# Patient Record
Sex: Male | Born: 1965
Health system: Southern US, Community
[De-identification: ages and names within clinical notes are randomized; demographics above are authoritative.]

## PROBLEM LIST (undated history)

## (undated) DIAGNOSIS — E119 Type 2 diabetes mellitus without complications: Secondary | ICD-10-CM

## (undated) DIAGNOSIS — K5909 Other constipation: Secondary | ICD-10-CM

## (undated) DIAGNOSIS — E785 Hyperlipidemia, unspecified: Secondary | ICD-10-CM

## (undated) DIAGNOSIS — I5022 Chronic systolic (congestive) heart failure: Secondary | ICD-10-CM

## (undated) DIAGNOSIS — I213 ST elevation (STEMI) myocardial infarction of unspecified site: Secondary | ICD-10-CM

## (undated) DIAGNOSIS — I10 Essential (primary) hypertension: Secondary | ICD-10-CM

## (undated) HISTORY — DX: Chronic systolic (congestive) heart failure: I50.22

## (undated) HISTORY — DX: Other constipation: K59.09

## (undated) HISTORY — DX: ST elevation (STEMI) myocardial infarction of unspecified site: I21.3

---

## 2010-04-20 ENCOUNTER — Encounter: Admission: RE | Admit: 2010-04-20 | Discharge: 2010-04-20 | Payer: Self-pay | Admitting: Specialist

## 2015-10-07 DIAGNOSIS — E119 Type 2 diabetes mellitus without complications: Secondary | ICD-10-CM | POA: Diagnosis not present

## 2015-11-27 DIAGNOSIS — G629 Polyneuropathy, unspecified: Secondary | ICD-10-CM | POA: Diagnosis not present

## 2015-11-27 DIAGNOSIS — E119 Type 2 diabetes mellitus without complications: Secondary | ICD-10-CM | POA: Diagnosis not present

## 2015-11-27 DIAGNOSIS — Z6832 Body mass index (BMI) 32.0-32.9, adult: Secondary | ICD-10-CM | POA: Diagnosis not present

## 2015-12-02 DIAGNOSIS — E119 Type 2 diabetes mellitus without complications: Secondary | ICD-10-CM | POA: Diagnosis not present

## 2015-12-04 DIAGNOSIS — G629 Polyneuropathy, unspecified: Secondary | ICD-10-CM | POA: Diagnosis not present

## 2015-12-04 DIAGNOSIS — R209 Unspecified disturbances of skin sensation: Secondary | ICD-10-CM | POA: Diagnosis not present

## 2015-12-05 DIAGNOSIS — E119 Type 2 diabetes mellitus without complications: Secondary | ICD-10-CM | POA: Diagnosis not present

## 2016-01-14 DIAGNOSIS — H20012 Primary iridocyclitis, left eye: Secondary | ICD-10-CM | POA: Diagnosis not present

## 2016-01-14 DIAGNOSIS — E113393 Type 2 diabetes mellitus with moderate nonproliferative diabetic retinopathy without macular edema, bilateral: Secondary | ICD-10-CM | POA: Diagnosis not present

## 2016-01-22 DIAGNOSIS — E113512 Type 2 diabetes mellitus with proliferative diabetic retinopathy with macular edema, left eye: Secondary | ICD-10-CM | POA: Diagnosis not present

## 2016-01-22 DIAGNOSIS — E113393 Type 2 diabetes mellitus with moderate nonproliferative diabetic retinopathy without macular edema, bilateral: Secondary | ICD-10-CM | POA: Diagnosis not present

## 2016-01-22 DIAGNOSIS — H25041 Posterior subcapsular polar age-related cataract, right eye: Secondary | ICD-10-CM | POA: Diagnosis not present

## 2016-01-22 DIAGNOSIS — H20012 Primary iridocyclitis, left eye: Secondary | ICD-10-CM | POA: Diagnosis not present

## 2016-02-06 DIAGNOSIS — E113413 Type 2 diabetes mellitus with severe nonproliferative diabetic retinopathy with macular edema, bilateral: Secondary | ICD-10-CM | POA: Diagnosis not present

## 2016-02-06 DIAGNOSIS — H3582 Retinal ischemia: Secondary | ICD-10-CM | POA: Diagnosis not present

## 2016-02-27 ENCOUNTER — Emergency Department (HOSPITAL_COMMUNITY): Payer: BLUE CROSS/BLUE SHIELD

## 2016-02-27 ENCOUNTER — Inpatient Hospital Stay (HOSPITAL_COMMUNITY)
Admission: EM | Admit: 2016-02-27 | Discharge: 2016-03-08 | DRG: 488 | Disposition: A | Discharge status: Home Health | Payer: BLUE CROSS/BLUE SHIELD | Attending: Orthopedic Surgery | Admitting: Orthopedic Surgery

## 2016-02-27 ENCOUNTER — Emergency Department (HOSPITAL_COMMUNITY): Payer: BLUE CROSS/BLUE SHIELD | Admitting: Certified Registered"

## 2016-02-27 ENCOUNTER — Inpatient Hospital Stay (HOSPITAL_COMMUNITY): Payer: BLUE CROSS/BLUE SHIELD

## 2016-02-27 ENCOUNTER — Encounter (HOSPITAL_COMMUNITY): Payer: Self-pay | Admitting: *Deleted

## 2016-02-27 ENCOUNTER — Encounter (HOSPITAL_COMMUNITY)
Admission: EM | Disposition: A | Discharge status: Home Health | Payer: Self-pay | Source: Home / Self Care | Attending: Orthopedic Surgery

## 2016-02-27 DIAGNOSIS — G8918 Other acute postprocedural pain: Secondary | ICD-10-CM | POA: Diagnosis not present

## 2016-02-27 DIAGNOSIS — T148 Other injury of unspecified body region: Secondary | ICD-10-CM | POA: Diagnosis not present

## 2016-02-27 DIAGNOSIS — S99911A Unspecified injury of right ankle, initial encounter: Secondary | ICD-10-CM | POA: Diagnosis not present

## 2016-02-27 DIAGNOSIS — Z419 Encounter for procedure for purposes other than remedying health state, unspecified: Secondary | ICD-10-CM

## 2016-02-27 DIAGNOSIS — R509 Fever, unspecified: Secondary | ICD-10-CM

## 2016-02-27 DIAGNOSIS — S82142A Displaced bicondylar fracture of left tibia, initial encounter for closed fracture: Secondary | ICD-10-CM | POA: Diagnosis not present

## 2016-02-27 DIAGNOSIS — E291 Testicular hypofunction: Secondary | ICD-10-CM | POA: Diagnosis not present

## 2016-02-27 DIAGNOSIS — Z01818 Encounter for other preprocedural examination: Secondary | ICD-10-CM | POA: Diagnosis not present

## 2016-02-27 DIAGNOSIS — Z7984 Long term (current) use of oral hypoglycemic drugs: Secondary | ICD-10-CM | POA: Diagnosis not present

## 2016-02-27 DIAGNOSIS — E119 Type 2 diabetes mellitus without complications: Secondary | ICD-10-CM

## 2016-02-27 DIAGNOSIS — S82141A Displaced bicondylar fracture of right tibia, initial encounter for closed fracture: Secondary | ICD-10-CM | POA: Diagnosis not present

## 2016-02-27 DIAGNOSIS — S82191A Other fracture of upper end of right tibia, initial encounter for closed fracture: Secondary | ICD-10-CM | POA: Diagnosis not present

## 2016-02-27 DIAGNOSIS — R5082 Postprocedural fever: Secondary | ICD-10-CM | POA: Diagnosis not present

## 2016-02-27 DIAGNOSIS — J9811 Atelectasis: Secondary | ICD-10-CM | POA: Diagnosis not present

## 2016-02-27 DIAGNOSIS — W19XXXA Unspecified fall, initial encounter: Secondary | ICD-10-CM | POA: Diagnosis not present

## 2016-02-27 DIAGNOSIS — M818 Other osteoporosis without current pathological fracture: Secondary | ICD-10-CM | POA: Diagnosis present

## 2016-02-27 DIAGNOSIS — S82143A Displaced bicondylar fracture of unspecified tibia, initial encounter for closed fracture: Secondary | ICD-10-CM | POA: Diagnosis present

## 2016-02-27 DIAGNOSIS — D62 Acute posthemorrhagic anemia: Secondary | ICD-10-CM | POA: Diagnosis not present

## 2016-02-27 DIAGNOSIS — S82251A Displaced comminuted fracture of shaft of right tibia, initial encounter for closed fracture: Secondary | ICD-10-CM | POA: Diagnosis not present

## 2016-02-27 DIAGNOSIS — W11XXXA Fall on and from ladder, initial encounter: Secondary | ICD-10-CM | POA: Diagnosis not present

## 2016-02-27 DIAGNOSIS — K5900 Constipation, unspecified: Secondary | ICD-10-CM | POA: Diagnosis not present

## 2016-02-27 DIAGNOSIS — E559 Vitamin D deficiency, unspecified: Secondary | ICD-10-CM | POA: Diagnosis not present

## 2016-02-27 DIAGNOSIS — L97404 Non-pressure chronic ulcer of unspecified heel and midfoot with necrosis of bone: Secondary | ICD-10-CM | POA: Diagnosis not present

## 2016-02-27 DIAGNOSIS — E1165 Type 2 diabetes mellitus with hyperglycemia: Secondary | ICD-10-CM | POA: Diagnosis present

## 2016-02-27 DIAGNOSIS — S82101A Unspecified fracture of upper end of right tibia, initial encounter for closed fracture: Secondary | ICD-10-CM

## 2016-02-27 DIAGNOSIS — T40605A Adverse effect of unspecified narcotics, initial encounter: Secondary | ICD-10-CM | POA: Diagnosis not present

## 2016-02-27 DIAGNOSIS — R269 Unspecified abnormalities of gait and mobility: Secondary | ICD-10-CM | POA: Diagnosis not present

## 2016-02-27 DIAGNOSIS — S83281A Other tear of lateral meniscus, current injury, right knee, initial encounter: Secondary | ICD-10-CM | POA: Diagnosis present

## 2016-02-27 DIAGNOSIS — L97104 Non-pressure chronic ulcer of unspecified thigh with necrosis of bone: Secondary | ICD-10-CM | POA: Diagnosis not present

## 2016-02-27 DIAGNOSIS — M25561 Pain in right knee: Secondary | ICD-10-CM | POA: Diagnosis not present

## 2016-02-27 HISTORY — PX: EXTERNAL FIXATION LEG: SHX1549

## 2016-02-27 HISTORY — DX: Type 2 diabetes mellitus without complications: E11.9

## 2016-02-27 LAB — BASIC METABOLIC PANEL
Anion gap: 10 (ref 5–15)
BUN: 14 mg/dL (ref 6–20)
CO2: 22 mmol/L (ref 22–32)
Calcium: 9 mg/dL (ref 8.9–10.3)
Chloride: 103 mmol/L (ref 101–111)
Creatinine, Ser: 0.68 mg/dL (ref 0.61–1.24)
GFR calc Af Amer: >60 mL/min (ref >60)
GFR calc non Af Amer: >60 mL/min (ref >60)
Glucose, Bld: 356 mg/dL — ABNORMAL HIGH (ref 65–99)
Potassium: 4.2 mmol/L (ref 3.5–5.1)
Sodium: 135 mmol/L (ref 135–145)

## 2016-02-27 LAB — CBC WITH DIFFERENTIAL/PLATELET
Basophils Absolute: 0.1 10*3/uL (ref 0.0–0.1)
Basophils Relative: 1 %
Eosinophils Absolute: 0.2 10*3/uL (ref 0.0–0.7)
Eosinophils Relative: 2 %
HCT: 40.2 % (ref 39.0–52.0)
Hemoglobin: 13.8 g/dL (ref 13.0–17.0)
Lymphocytes Relative: 11 %
Lymphs Abs: 1.3 10*3/uL (ref 0.7–4.0)
MCH: 29.8 pg (ref 26.0–34.0)
MCHC: 34.3 g/dL (ref 30.0–36.0)
MCV: 86.8 fL (ref 78.0–100.0)
Monocytes Absolute: 0.5 10*3/uL (ref 0.1–1.0)
Monocytes Relative: 4 %
Neutro Abs: 9.6 10*3/uL — ABNORMAL HIGH (ref 1.7–7.7)
Neutrophils Relative %: 82 %
Platelets: 192 10*3/uL (ref 150–400)
RBC: 4.63 MIL/uL (ref 4.22–5.81)
RDW: 12.5 % (ref 11.5–15.5)
WBC: 11.7 10*3/uL — ABNORMAL HIGH (ref 4.0–10.5)

## 2016-02-27 LAB — GLUCOSE, CAPILLARY
Glucose-Capillary: 325 mg/dL — ABNORMAL HIGH (ref 65–99)
Glucose-Capillary: 305 mg/dL — ABNORMAL HIGH (ref 65–99)

## 2016-02-27 LAB — CBG MONITORING, ED: Glucose-Capillary: 288 mg/dL — ABNORMAL HIGH (ref 65–99)

## 2016-02-27 LAB — PROTIME-INR
INR: 1.11
Prothrombin Time: 14.4 seconds (ref 11.4–15.2)

## 2016-02-27 LAB — SURGICAL PCR SCREEN
MRSA, PCR: NEGATIVE
Staphylococcus aureus: NEGATIVE

## 2016-02-27 SURGERY — EXTERNAL FIXATION, LOWER EXTREMITY
Anesthesia: General | Site: Knee | Laterality: Right

## 2016-02-27 MED ORDER — 0.9 % SODIUM CHLORIDE (POUR BTL) OPTIME
TOPICAL | Status: DC | PRN
  Administered 2016-02-27: 1000 mL

## 2016-02-27 MED ORDER — ENOXAPARIN SODIUM 40 MG/0.4ML ~~LOC~~ SOLN
40.0000 mg | SUBCUTANEOUS | Status: DC
  Administered 2016-02-28 – 2016-03-08 (×9): 40 mg via SUBCUTANEOUS
  Filled 2016-02-27 (×9): qty 0.4

## 2016-02-27 MED ORDER — PROPOFOL 10 MG/ML IV BOLUS
INTRAVENOUS | Status: AC
  Filled 2016-02-27: qty 20

## 2016-02-27 MED ORDER — ACETAMINOPHEN 325 MG PO TABS
650.0000 mg | ORAL_TABLET | Freq: Four times a day (QID) | ORAL | Status: DC | PRN
  Administered 2016-02-28 – 2016-03-02 (×7): 650 mg via ORAL
  Filled 2016-02-27 (×9): qty 2

## 2016-02-27 MED ORDER — METOCLOPRAMIDE HCL 5 MG/ML IJ SOLN
INTRAMUSCULAR | Status: DC | PRN
  Administered 2016-02-27: 10 mg via INTRAVENOUS

## 2016-02-27 MED ORDER — MUPIROCIN 2 % EX OINT
TOPICAL_OINTMENT | CUTANEOUS | Status: AC
  Filled 2016-02-27: qty 22

## 2016-02-27 MED ORDER — MIDAZOLAM HCL 2 MG/2ML IJ SOLN
INTRAMUSCULAR | Status: AC
  Filled 2016-02-27: qty 2

## 2016-02-27 MED ORDER — METHOCARBAMOL 500 MG PO TABS
500.0000 mg | ORAL_TABLET | Freq: Four times a day (QID) | ORAL | Status: DC | PRN
  Administered 2016-02-27 – 2016-03-08 (×23): 500 mg via ORAL
  Filled 2016-02-27 (×24): qty 1

## 2016-02-27 MED ORDER — ROCURONIUM BROMIDE 10 MG/ML (PF) SYRINGE
PREFILLED_SYRINGE | INTRAVENOUS | Status: AC
  Filled 2016-02-27: qty 10

## 2016-02-27 MED ORDER — HYDROMORPHONE HCL 1 MG/ML IJ SOLN
1.0000 mg | INTRAMUSCULAR | Status: DC | PRN
  Administered 2016-02-27 – 2016-03-07 (×29): 1 mg via INTRAVENOUS
  Filled 2016-02-27 (×31): qty 1

## 2016-02-27 MED ORDER — CEFAZOLIN SODIUM-DEXTROSE 2-4 GM/100ML-% IV SOLN
2.0000 g | INTRAVENOUS | Status: AC
  Administered 2016-02-27: 2 g via INTRAVENOUS

## 2016-02-27 MED ORDER — INSULIN ASPART 100 UNIT/ML ~~LOC~~ SOLN
0.0000 [IU] | Freq: Three times a day (TID) | SUBCUTANEOUS | Status: DC
  Administered 2016-02-27: 11 [IU] via SUBCUTANEOUS
  Administered 2016-02-28 (×2): 5 [IU] via SUBCUTANEOUS
  Administered 2016-02-28: 3 [IU] via SUBCUTANEOUS
  Administered 2016-02-29: 0 [IU] via SUBCUTANEOUS
  Administered 2016-02-29 – 2016-03-01 (×3): 3 [IU] via SUBCUTANEOUS
  Administered 2016-03-01: 2 [IU] via SUBCUTANEOUS
  Administered 2016-03-01 – 2016-03-02 (×2): 3 [IU] via SUBCUTANEOUS
  Administered 2016-03-02: 5 [IU] via SUBCUTANEOUS
  Administered 2016-03-02 – 2016-03-03 (×3): 3 [IU] via SUBCUTANEOUS
  Administered 2016-03-03: 2 [IU] via SUBCUTANEOUS
  Administered 2016-03-04 – 2016-03-05 (×2): 5 [IU] via SUBCUTANEOUS
  Administered 2016-03-05 – 2016-03-06 (×3): 3 [IU] via SUBCUTANEOUS
  Administered 2016-03-06: 2 [IU] via SUBCUTANEOUS

## 2016-02-27 MED ORDER — ONDANSETRON HCL 4 MG/2ML IJ SOLN
INTRAMUSCULAR | Status: DC | PRN
  Administered 2016-02-27: 4 mg via INTRAVENOUS

## 2016-02-27 MED ORDER — PROPOFOL 10 MG/ML IV BOLUS
INTRAVENOUS | Status: DC | PRN
  Administered 2016-02-27: 200 mg via INTRAVENOUS

## 2016-02-27 MED ORDER — SUFENTANIL CITRATE 50 MCG/ML IV SOLN
INTRAVENOUS | Status: DC | PRN
Start: 2016-02-27 — End: 2016-02-27
  Administered 2016-02-27: 10 ug via INTRAVENOUS

## 2016-02-27 MED ORDER — HYDROMORPHONE HCL 1 MG/ML IJ SOLN
1.0000 mg | Freq: Once | INTRAMUSCULAR | Status: AC
  Administered 2016-02-27: 1 mg via INTRAVENOUS
  Filled 2016-02-27: qty 1

## 2016-02-27 MED ORDER — SUCCINYLCHOLINE CHLORIDE 200 MG/10ML IV SOSY
PREFILLED_SYRINGE | INTRAVENOUS | Status: AC
  Filled 2016-02-27: qty 10

## 2016-02-27 MED ORDER — SODIUM CHLORIDE 0.9 % IV SOLN
INTRAVENOUS | Status: DC
  Administered 2016-02-28 (×2): via INTRAVENOUS
  Administered 2016-02-29: 75 mL/h via INTRAVENOUS
  Administered 2016-03-01 – 2016-03-05 (×5): via INTRAVENOUS

## 2016-02-27 MED ORDER — ONDANSETRON HCL 4 MG/2ML IJ SOLN
INTRAMUSCULAR | Status: AC
  Filled 2016-02-27: qty 2

## 2016-02-27 MED ORDER — METHOCARBAMOL 1000 MG/10ML IJ SOLN
500.0000 mg | Freq: Four times a day (QID) | INTRAVENOUS | Status: DC | PRN
  Filled 2016-02-27: qty 5

## 2016-02-27 MED ORDER — LIDOCAINE 2% (20 MG/ML) 5 ML SYRINGE
INTRAMUSCULAR | Status: AC
  Filled 2016-02-27: qty 5

## 2016-02-27 MED ORDER — SUFENTANIL CITRATE 50 MCG/ML IV SOLN
INTRAVENOUS | Status: AC
  Filled 2016-02-27: qty 1

## 2016-02-27 MED ORDER — MIDAZOLAM HCL 5 MG/5ML IJ SOLN
INTRAMUSCULAR | Status: DC | PRN
  Administered 2016-02-27: 2 mg via INTRAVENOUS

## 2016-02-27 MED ORDER — ONDANSETRON HCL 4 MG/2ML IJ SOLN: 4.0000 mg | Freq: Four times a day (QID) | INTRAMUSCULAR | Status: DC | PRN

## 2016-02-27 MED ORDER — LACTATED RINGERS IV SOLN
INTRAVENOUS | Status: DC | PRN
  Administered 2016-02-27: 18:00:00 via INTRAVENOUS

## 2016-02-27 MED ORDER — CHLORHEXIDINE GLUCONATE 4 % EX LIQD: 60.0000 mL | Freq: Once | CUTANEOUS | Status: DC

## 2016-02-27 MED ORDER — METOCLOPRAMIDE HCL 5 MG/ML IJ SOLN
INTRAMUSCULAR | Status: AC
  Filled 2016-02-27: qty 2

## 2016-02-27 MED ORDER — DOCUSATE SODIUM 100 MG PO CAPS
100.0000 mg | ORAL_CAPSULE | Freq: Two times a day (BID) | ORAL | Status: DC
  Administered 2016-02-27 – 2016-03-08 (×19): 100 mg via ORAL
  Filled 2016-02-27 (×19): qty 1

## 2016-02-27 MED ORDER — KETOROLAC TROMETHAMINE 30 MG/ML IJ SOLN: 30.0000 mg | Freq: Once | INTRAMUSCULAR | Status: DC | PRN

## 2016-02-27 MED ORDER — LIDOCAINE 2% (20 MG/ML) 5 ML SYRINGE
INTRAMUSCULAR | Status: DC | PRN
  Administered 2016-02-27: 60 mg via INTRAVENOUS

## 2016-02-27 MED ORDER — HYDROMORPHONE HCL 1 MG/ML IJ SOLN
INTRAMUSCULAR | Status: AC
  Filled 2016-02-27: qty 1

## 2016-02-27 MED ORDER — INSULIN ASPART 100 UNIT/ML ~~LOC~~ SOLN
SUBCUTANEOUS | Status: AC
  Filled 2016-02-27: qty 11

## 2016-02-27 MED ORDER — CEFAZOLIN SODIUM-DEXTROSE 2-4 GM/100ML-% IV SOLN
INTRAVENOUS | Status: AC
  Filled 2016-02-27: qty 100

## 2016-02-27 MED ORDER — POLYETHYLENE GLYCOL 3350 17 G PO PACK
17.0000 g | PACK | Freq: Every day | ORAL | Status: DC | PRN
  Administered 2016-03-01: 17 g via ORAL
  Filled 2016-02-27: qty 1

## 2016-02-27 MED ORDER — METOCLOPRAMIDE HCL 5 MG/ML IJ SOLN: 5.0000 mg | Freq: Three times a day (TID) | INTRAMUSCULAR | Status: DC | PRN

## 2016-02-27 MED ORDER — INSULIN ASPART 100 UNIT/ML ~~LOC~~ SOLN
4.0000 [IU] | Freq: Three times a day (TID) | SUBCUTANEOUS | Status: DC
  Administered 2016-02-28: 4 [IU] via SUBCUTANEOUS

## 2016-02-27 MED ORDER — INSULIN ASPART 100 UNIT/ML ~~LOC~~ SOLN
0.0000 [IU] | Freq: Every day | SUBCUTANEOUS | Status: DC
  Administered 2016-02-27: 4 [IU] via SUBCUTANEOUS

## 2016-02-27 MED ORDER — PROMETHAZINE HCL 25 MG/ML IJ SOLN: 6.2500 mg | INTRAMUSCULAR | Status: DC | PRN

## 2016-02-27 MED ORDER — ACETAMINOPHEN 650 MG RE SUPP: 650.0000 mg | Freq: Four times a day (QID) | RECTAL | Status: DC | PRN

## 2016-02-27 MED ORDER — LACTATED RINGERS IV SOLN
INTRAVENOUS | Status: DC
  Administered 2016-03-04: 08:00:00 via INTRAVENOUS

## 2016-02-27 MED ORDER — OXYCODONE HCL 5 MG PO TABS
5.0000 mg | ORAL_TABLET | ORAL | Status: DC | PRN
  Administered 2016-02-28 – 2016-03-08 (×43): 10 mg via ORAL
  Administered 2016-03-08: 5 mg via ORAL
  Filled 2016-02-27 (×16): qty 2
  Filled 2016-02-27: qty 1
  Filled 2016-02-27 (×30): qty 2

## 2016-02-27 MED ORDER — PHENYLEPHRINE 40 MCG/ML (10ML) SYRINGE FOR IV PUSH (FOR BLOOD PRESSURE SUPPORT)
PREFILLED_SYRINGE | INTRAVENOUS | Status: AC
  Filled 2016-02-27: qty 10

## 2016-02-27 MED ORDER — METFORMIN HCL 500 MG PO TABS
1000.0000 mg | ORAL_TABLET | Freq: Two times a day (BID) | ORAL | Status: DC
Start: 2016-02-28 — End: 2016-02-28
  Administered 2016-02-28: 1000 mg via ORAL
  Filled 2016-02-27: qty 2

## 2016-02-27 MED ORDER — SUCCINYLCHOLINE CHLORIDE 200 MG/10ML IV SOSY
PREFILLED_SYRINGE | INTRAVENOUS | Status: DC | PRN
  Administered 2016-02-27: 120 mg via INTRAVENOUS

## 2016-02-27 MED ORDER — SODIUM CHLORIDE 0.9 % IJ SOLN
INTRAMUSCULAR | Status: AC
  Filled 2016-02-27: qty 10

## 2016-02-27 MED ORDER — ONDANSETRON HCL 4 MG PO TABS: 4.0000 mg | ORAL_TABLET | Freq: Four times a day (QID) | ORAL | Status: DC | PRN

## 2016-02-27 MED ORDER — CEFAZOLIN SODIUM-DEXTROSE 2-4 GM/100ML-% IV SOLN
2.0000 g | Freq: Four times a day (QID) | INTRAVENOUS | Status: AC
  Administered 2016-02-27 – 2016-02-28 (×3): 2 g via INTRAVENOUS
  Filled 2016-02-27 (×3): qty 100

## 2016-02-27 MED ORDER — HYDROMORPHONE HCL 1 MG/ML IJ SOLN: 0.2500 mg | INTRAMUSCULAR | Status: DC | PRN

## 2016-02-27 MED ORDER — METOCLOPRAMIDE HCL 5 MG PO TABS: 5.0000 mg | ORAL_TABLET | Freq: Three times a day (TID) | ORAL | Status: DC | PRN

## 2016-02-27 SURGICAL SUPPLY — 60 items
BANDAGE ACE 4X5 VEL STRL LF (GAUZE/BANDAGES/DRESSINGS) ×2 IMPLANT
BANDAGE ACE 6X5 VEL STRL LF (GAUZE/BANDAGES/DRESSINGS) ×2 IMPLANT
BANDAGE ESMARK 6X9 LF (GAUZE/BANDAGES/DRESSINGS) ×1 IMPLANT
BAR GLASS FIBER EXFX 11X500 (EXFIX) ×4 IMPLANT
BNDG COHESIVE 6X5 TAN STRL LF (GAUZE/BANDAGES/DRESSINGS) ×2 IMPLANT
BNDG ELASTIC 6X10 VLCR STRL LF (GAUZE/BANDAGES/DRESSINGS) ×2 IMPLANT
BNDG ESMARK 6X9 LF (GAUZE/BANDAGES/DRESSINGS) ×2
BNDG GAUZE ELAST 4 BULKY (GAUZE/BANDAGES/DRESSINGS) ×2 IMPLANT
CLAMP MULTI-PIN 2-BAR 75MM (EXFIX) ×4 IMPLANT
CLEANER TIP ELECTROSURG 2X2 (MISCELLANEOUS) ×2 IMPLANT
COVER SURGICAL LIGHT HANDLE (MISCELLANEOUS) ×2 IMPLANT
CUFF TOURNIQUET SINGLE 18IN (TOURNIQUET CUFF) IMPLANT
CUFF TOURNIQUET SINGLE 24IN (TOURNIQUET CUFF) IMPLANT
CUFF TOURNIQUET SINGLE 34IN LL (TOURNIQUET CUFF) IMPLANT
DRAPE C-ARM 42X72 X-RAY (DRAPES) IMPLANT
DRAPE OEC MINIVIEW 54X84 (DRAPES) ×2 IMPLANT
DRAPE U-SHAPE 47X51 STRL (DRAPES) ×2 IMPLANT
DRSG ADAPTIC 3X8 NADH LF (GAUZE/BANDAGES/DRESSINGS) ×2 IMPLANT
GAUZE SPONGE 4X4 12PLY STRL (GAUZE/BANDAGES/DRESSINGS) ×2 IMPLANT
GAUZE XEROFORM 1X8 LF (GAUZE/BANDAGES/DRESSINGS) ×2 IMPLANT
GLOVE BIO SURGEON STRL SZ8 (GLOVE) ×2 IMPLANT
GLOVE BIOGEL M STRL SZ7.5 (GLOVE) ×2 IMPLANT
GLOVE EUDERMIC 7 POWDERFREE (GLOVE) ×2 IMPLANT
GLOVE SS BIOGEL STRL SZ 7.5 (GLOVE) ×1 IMPLANT
GLOVE SUPERSENSE BIOGEL SZ 7.5 (GLOVE) ×1
GOWN STRL REUS W/ TWL LRG LVL3 (GOWN DISPOSABLE) ×1 IMPLANT
GOWN STRL REUS W/ TWL XL LVL3 (GOWN DISPOSABLE) ×2 IMPLANT
GOWN STRL REUS W/TWL LRG LVL3 (GOWN DISPOSABLE) ×1
GOWN STRL REUS W/TWL XL LVL3 (GOWN DISPOSABLE) ×2
HALF PIN 5.0X160 (EXFIX) ×4 IMPLANT
HANDPIECE INTERPULSE COAX TIP (DISPOSABLE)
KIT ROOM TURNOVER OR (KITS) ×2 IMPLANT
MANIFOLD NEPTUNE II (INSTRUMENTS) ×2 IMPLANT
NEEDLE 22X1 1/2 (OR ONLY) (NEEDLE) IMPLANT
NS IRRIG 1000ML POUR BTL (IV SOLUTION) ×2 IMPLANT
PACK ORTHO EXTREMITY (CUSTOM PROCEDURE TRAY) ×2 IMPLANT
PAD ARMBOARD 7.5X6 YLW CONV (MISCELLANEOUS) ×4 IMPLANT
PADDING CAST COTTON 6X4 STRL (CAST SUPPLIES) ×6 IMPLANT
PIN HALF YELLOW 5X160X35 (EXFIX) ×4 IMPLANT
SET HNDPC FAN SPRY TIP SCT (DISPOSABLE) IMPLANT
SPONGE LAP 18X18 X RAY DECT (DISPOSABLE) ×2 IMPLANT
SPONGE SCRUB IODOPHOR (GAUZE/BANDAGES/DRESSINGS) ×2 IMPLANT
STAPLER VISISTAT 35W (STAPLE) IMPLANT
STOCKINETTE IMPERVIOUS LG (DRAPES) ×2 IMPLANT
STRIP CLOSURE SKIN 1/2X4 (GAUZE/BANDAGES/DRESSINGS) IMPLANT
SUCTION FRAZIER HANDLE 10FR (MISCELLANEOUS)
SUCTION TUBE FRAZIER 10FR DISP (MISCELLANEOUS) IMPLANT
SUT ETHILON 3 0 PS 1 (SUTURE) IMPLANT
SUT VIC AB 0 CT1 27 (SUTURE) ×2
SUT VIC AB 0 CT1 27XBRD ANBCTR (SUTURE) ×2 IMPLANT
SUT VIC AB 2-0 CT1 27 (SUTURE) ×2
SUT VIC AB 2-0 CT1 TAPERPNT 27 (SUTURE) ×2 IMPLANT
SUT VIC AB 2-0 CT3 27 (SUTURE) IMPLANT
SYR CONTROL 10ML LL (SYRINGE) IMPLANT
TOWEL OR 17X24 6PK STRL BLUE (TOWEL DISPOSABLE) ×4 IMPLANT
TOWEL OR 17X26 10 PK STRL BLUE (TOWEL DISPOSABLE) ×4 IMPLANT
TUBE CONNECTING 12X1/4 (SUCTIONS) ×2 IMPLANT
UNDERPAD 30X30 (UNDERPADS AND DIAPERS) ×2 IMPLANT
WATER STERILE IRR 1000ML POUR (IV SOLUTION) ×4 IMPLANT
YANKAUER SUCT BULB TIP NO VENT (SUCTIONS) ×2 IMPLANT

## 2016-02-27 NOTE — Transfer of Care (Signed)
Immediate Anesthesia Transfer of Care Note  Patient: Lawrence [name]  Procedure(s) Performed: Procedure(s) with comments: EXTERNAL FIXATION LEG (Right) - reduction and external fixation right tibial plateau fracture  Patient Location: PACU  Anesthesia Type:General  Level of Consciousness: awake  Airway & Oxygen Therapy: Patient Spontanous Breathing  Post-op Assessment: Report given to RN and Post -op Vital signs reviewed and stable  Post vital signs: Reviewed and stable  Last Vitals:  Vitals:   02/27/16 1441 02/27/16 1907  BP: 161/92 (!) 133/59  Pulse: 104   Resp: 24   Temp: 36.8 C 36.5 C    Last Pain:  Vitals:   02/27/16 1907  TempSrc:   PainSc: 0-No pain         Complications: No apparent anesthesia complications
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
## 2016-02-27 NOTE — Progress Notes (Signed)
Patient's family states that patient has never worn CPAP at night and does not wish to wear now.

## 2016-02-27 NOTE — Anesthesia Procedure Notes (Signed)
Procedure Name: Intubation Date/Time: 02/27/2016 6:16 PM Performed by: Melina Copa, DAVID R Pre-anesthesia Checklist: Patient identified, Emergency Drugs available, Suction available and Patient being monitored Patient Re-evaluated:Patient Re-evaluated prior to inductionOxygen Delivery Method: Circle System Utilized Preoxygenation: Pre-oxygenation with 100% oxygen Intubation Type: IV induction and Rapid sequence Ventilation: Mask ventilation without difficulty Laryngoscope Size: Mac Grade View: Grade I Tube type: Oral Tube size: 8.0 mm Number of attempts: 1 Airway Equipment and Method: Stylet Placement Confirmation: ETT inserted through vocal cords under direct vision,  positive ETCO2 and breath sounds checked- equal and bilateral Secured at: 24 cm Tube secured with: Tape Dental Injury: Teeth and Oropharynx as per pre-operative assessment
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
## 2016-02-27 NOTE — Brief Op Note (Signed)
02/27/2016  7:10 PM  PATIENT:  Lawrence [name]  50 y.o. male  PRE-OPERATIVE DIAGNOSIS:  Comminuted and displaced fracture of right tibial plateau  POST-OPERATIVE DIAGNOSIS: Comminuted and displaced fracture of right tibial plateau  PROCEDURE:  Procedure(s) with comments: EXTERNAL FIXATION LEG (Right) - reduction and external fixation right tibial plateau fracture - spanning fixator  SURGEON:  Surgeon(s) and Role:    * Netta Cedars, MD - Primary  PHYSICIAN ASSISTANT:   ASSISTANTS: none   ANESTHESIA:   general  EBL:  Total I/O In: 500 [I.V.:500] Out: 2 [Blood:2]  BLOOD ADMINISTERED:none  DRAINS: none   LOCAL MEDICATIONS USED:  NONE  SPECIMEN:  No Specimen  DISPOSITION OF SPECIMEN:  N/A  COUNTS:  YES  TOURNIQUET:    DICTATION:1111  PLAN OF CARE: Admit to inpatient   PATIENT DISPOSITION:  PACU - hemodynamically stable.   Delay start of Pharmacological VTE agent (>24hrs) due to surgical blood loss or risk of bleeding: no

## 2016-02-27 NOTE — Anesthesia Postprocedure Evaluation (Signed)
Anesthesia Post Note  Patient: Lawrence [name]  Procedure(s) Performed: Procedure(s) (LRB): EXTERNAL FIXATION LEG (Right)  Patient location during evaluation: PACU Anesthesia Type: General Level of consciousness: awake and alert Pain management: pain level controlled Vital Signs Assessment: post-procedure vital signs reviewed and stable Respiratory status: spontaneous breathing, nonlabored ventilation, respiratory function stable and patient connected to nasal cannula oxygen Cardiovascular status: blood pressure returned to baseline and stable Postop Assessment: no signs of nausea or vomiting Anesthetic complications: no    Last Vitals:  Vitals:   02/27/16 1944 02/27/16 2020  BP: (!) 164/87 (!) 147/97  Pulse: (!) 120 (!) 108  Resp:  18  Temp: 36.7 C 37.4 C    Last Pain:  Vitals:   02/27/16 2020  TempSrc: Oral  PainSc: 10-Worst pain ever                 Tiajuana Amass

## 2016-02-27 NOTE — ED Notes (Signed)
Pt taken to Xray at this time.

## 2016-02-27 NOTE — ED Triage Notes (Signed)
Pt arrives from work via [employer] after falling off of a ladder and landing onto his feet. Pt didn't hit his head and only has c/o right knee pain. No edema or deformity noted.

## 2016-02-27 NOTE — Anesthesia Preprocedure Evaluation (Addendum)
Anesthesia Evaluation  Patient identified by MRN, date of birth, ID band Patient awake    Reviewed: Allergy & Precautions, NPO status , Patient's Chart, lab work & pertinent test results  Airway Mallampati: I  TM Distance: >3 FB Neck ROM: Full    Dental  (+) Teeth Intact, Dental Advisory Given   Pulmonary    breath sounds clear to auscultation       Cardiovascular  Rhythm:Regular Rate:Normal     Neuro/Psych    GI/Hepatic   Endo/Other  diabetes, Type 2, Oral Hypoglycemic Agents  Renal/GU      Musculoskeletal   Abdominal   Peds  Hematology   Anesthesia Other Findings   Reproductive/Obstetrics                            Anesthesia Physical Anesthesia Plan  ASA: II and emergent  Anesthesia Plan: General   Post-op Pain Management:    Induction: Intravenous, Rapid sequence and Cricoid pressure planned  Airway Management Planned: Oral ETT  Additional Equipment: None  Intra-op Plan:   Post-operative Plan: Extubation in OR  Informed Consent: I have reviewed the patients History and Physical, chart, labs and discussed the procedure including the risks, benefits and alternatives for the proposed anesthesia with the patient or authorized representative who has indicated his/her understanding and acceptance.   Dental advisory given  Plan Discussed with: CRNA, Anesthesiologist and Surgeon  Anesthesia Plan Comments:        Anesthesia Quick Evaluation

## 2016-02-27 NOTE — ED Notes (Signed)
Patient transported to X-ray 

## 2016-02-27 NOTE — H&P (Signed)
Lawrence [name] is an 50 y.o. male.   Chief Complaint: Right knee pain HPI: 50 yo male s/p fall from ladder approximately 5-7 feet landing on his right leg.  Patient complained of immediate right knee pain and was unable to bear weight on the knee.  Patient transported to the [facility] ED for further eval and treatment.  History reviewed. No pertinent past medical history.  History reviewed. No pertinent surgical history.  History reviewed. No pertinent family history. Social History:  does not have a smoking history on file. He has never used smokeless tobacco. He reports that he drinks alcohol. He reports that he does not use drugs.  Allergies: No Known Allergies   (Not in a hospital admission)  Results for orders placed or performed during the hospital encounter of 02/27/16 (from the past 48 hour(s))  Basic metabolic panel     Status: Abnormal   Collection Time: 02/27/16  4:00 PM  Result Value Ref Range   Sodium 135 135 - 145 mmol/L   Potassium 4.2 3.5 - 5.1 mmol/L   Chloride 103 101 - 111 mmol/L   CO2 22 22 - 32 mmol/L   Glucose, Bld 356 (H) 65 - 99 mg/dL   BUN 14 6 - 20 mg/dL   Creatinine, Ser 0.68 0.61 - 1.24 mg/dL   Calcium 9.0 8.9 - 10.3 mg/dL   GFR calc non Af Amer >60 >60 mL/min   GFR calc Af Amer >60 >60 mL/min    Comment: (NOTE) The eGFR has been calculated using the CKD EPI equation. This calculation has not been validated in all clinical situations. eGFR's persistently <60 mL/min signify possible Chronic Kidney Disease.    Anion gap 10 5 - 15  CBC with Differential     Status: Abnormal   Collection Time: 02/27/16  4:00 PM  Result Value Ref Range   WBC 11.7 (H) 4.0 - 10.5 K/uL   RBC 4.63 4.22 - 5.81 MIL/uL   Hemoglobin 13.8 13.0 - 17.0 g/dL   HCT 40.2 39.0 - 52.0 %   MCV 86.8 78.0 - 100.0 fL   MCH 29.8 26.0 - 34.0 pg   MCHC 34.3 30.0 - 36.0 g/dL   RDW 12.5 11.5 - 15.5 %   Platelets 192 150 - 400 K/uL   Neutrophils Relative % 82 %   Neutro Abs 9.6 (H) 1.7 - 7.7  K/uL   Lymphocytes Relative 11 %   Lymphs Abs 1.3 0.7 - 4.0 K/uL   Monocytes Relative 4 %   Monocytes Absolute 0.5 0.1 - 1.0 K/uL   Eosinophils Relative 2 %   Eosinophils Absolute 0.2 0.0 - 0.7 K/uL   Basophils Relative 1 %   Basophils Absolute 0.1 0.0 - 0.1 K/uL  CBG monitoring, ED     Status: Abnormal   Collection Time: 02/27/16  4:40 PM  Result Value Ref Range   Glucose-Capillary 288 (H) 65 - 99 mg/dL   Dg Knee 1-2 Views Right  Result Date: 02/27/2016 CLINICAL DATA:  Injury.  Pain and deformity. EXAM: RIGHT KNEE - 1-2 VIEW COMPARISON:  No recent. FINDINGS: Comminuted, displaced, angulated fracture of the of proximal tibia is noted. Fracture extends into the joint space. Knee joint effusion noted. IMPRESSION: Comminuted prominent displaced and angulated fracture of the of proximal tibia with extension of the fracture is into the joint space. Knee joint effusion noted. Electronically Signed   By: Marcello Moores  Register   On: 02/27/2016 15:53   Dg Tibia/fibula Right  Result Date: 02/27/2016  CLINICAL DATA:  50 year old who fell approximately 5 feet off of a ladder in the garage earlier today. Injury to the right lower leg. Initial encounter. EXAM: RIGHT TIBIA AND FIBULA - 2 VIEW COMPARISON:  Right knee x-ray obtained concurrently. FINDINGS: Comminuted multipart fracture involving the proximal tibia which extends from the lateral plateau to the medial cortex of the proximal metaphysis, such that the medial tibial plateau is a free fragment. No fractures elsewhere involving the tibia or fibula. IMPRESSION: Comminuted multipart fracture involving the proximal tibia extending from the lateral tibial plateau to the medial cortex of the proximal metaphysis. The medial tibial plateau is present as a free fragment. Electronically Signed   By: Evangeline Dakin M.D.   On: 02/27/2016 15:48   Dg Ankle Complete Right  Result Date: 02/27/2016 CLINICAL DATA:  50 year old who fell approximately 5 feet off of a  ladder in a garage earlier today. Injury to the right lower leg. Initial encounter. EXAM: RIGHT ANKLE - COMPLETE 3+ VIEW COMPARISON:  None. FINDINGS: No evidence of acute fracture. Ankle mortise intact with well-preserved joint space. Well-preserved bone mineral density. No intrinsic osseous abnormalities. No visible joint effusion. IMPRESSION: Normal examination. Electronically Signed   By: Evangeline Dakin M.D.   On: 02/27/2016 15:45    ROS  Blood pressure 161/92, pulse 104, temperature 98.2 F (36.8 C), temperature source Oral, resp. rate 24, SpO2 100 %. Physical Exam   AAO, neck non tender with normal AROM, chest non tender to compression with normal excursion, bilateral UEs including clavicles with no tenderness and no deformity, normal AROM and motor strength in both arms Right knee propped up and swollen, compartments not especially tense, good pulses and sensation distally, Left LE with normal AROM, no pain, CTL spine nontender  Assessment/Plan Comminuted displaced right tibial plateau fracture. Plan closed reduction and placement of spanning external fixator and then will obtain a thin cut CT scan of the knee with sagittal and coronal reconstructions and 3 D reformat. Dr Helane Gunther (ortho-trauma)consulted on patient who agrees with plan  Augustin Schooling, MD 02/27/2016, 5:24 PM

## 2016-02-28 DIAGNOSIS — E119 Type 2 diabetes mellitus without complications: Secondary | ICD-10-CM

## 2016-02-28 LAB — GLUCOSE, CAPILLARY
Glucose-Capillary: 235 mg/dL — ABNORMAL HIGH (ref 65–99)
Glucose-Capillary: 173 mg/dL — ABNORMAL HIGH (ref 65–99)
Glucose-Capillary: 211 mg/dL — ABNORMAL HIGH (ref 65–99)
Glucose-Capillary: 215 mg/dL — ABNORMAL HIGH (ref 65–99)
Glucose-Capillary: 145 mg/dL — ABNORMAL HIGH (ref 65–99)

## 2016-02-28 MED ORDER — INSULIN GLARGINE 100 UNIT/ML ~~LOC~~ SOLN
10.0000 [IU] | Freq: Every day | SUBCUTANEOUS | Status: DC
  Administered 2016-02-28 – 2016-03-01 (×3): 10 [IU] via SUBCUTANEOUS
  Filled 2016-02-28 (×4): qty 0.1

## 2016-02-28 MED ORDER — INSULIN GLARGINE 100 UNIT/ML ~~LOC~~ SOLN: 10.0000 [IU] | Freq: Every day | SUBCUTANEOUS | Status: DC

## 2016-02-28 MED ORDER — METFORMIN HCL 500 MG PO TABS
1000.0000 mg | ORAL_TABLET | Freq: Two times a day (BID) | ORAL | Status: DC
  Administered 2016-02-28 – 2016-02-29 (×2): 1000 mg via ORAL
  Filled 2016-02-28 (×2): qty 2

## 2016-02-28 MED ORDER — INSULIN ASPART 100 UNIT/ML ~~LOC~~ SOLN: 0.0000 [IU] | Freq: Three times a day (TID) | SUBCUTANEOUS | Status: DC

## 2016-02-28 MED ORDER — INSULIN ASPART 100 UNIT/ML ~~LOC~~ SOLN
4.0000 [IU] | Freq: Three times a day (TID) | SUBCUTANEOUS | Status: DC
  Administered 2016-02-28 – 2016-03-04 (×14): 4 [IU] via SUBCUTANEOUS

## 2016-02-28 MED ORDER — INSULIN GLARGINE 100 UNIT/ML ~~LOC~~ SOLN: 15.0000 [IU] | Freq: Every day | SUBCUTANEOUS | Status: DC

## 2016-02-28 NOTE — Progress Notes (Signed)
Orthopedic Tech Progress Note Patient Details:  Lawrence [name] May 03, 1966 [id number]  Ortho Devices Ortho Device/Splint Location: Trapeze bar Ortho Device/Splint Interventions: Application   [name] 02/28/2016, 2:35 PM

## 2016-02-28 NOTE — Evaluation (Signed)
Physical Therapy Evaluation Patient Details Name: Lawrence [name] MRN: [id number] DOB: 12/26/1965 Today's Date: 02/28/2016   History of Present Illness  Pt is a 50 y/o male admitted after falling from his ladder, sustaining a tibial plateau fx. Pt now with ex fix on L LE. No pertinent PMH.  Clinical Impression  Pt presented supine in bed with HOB elevated, initially asleep but easily aroused and willing to participate in therapy session. Prior to admission, pt was independent with all functional mobility. Pt able to perform transfer from bed to recliner with min A and use of RW. Pt would continue to benefit from skilled physical therapy services at this time while admitted and after d/c to address his below listed limitations in order to improve his overall safety and independence with functional mobility.     Follow Up Recommendations Home health PT;Supervision/Assistance - 24 hour    Equipment Recommendations  Rolling walker with 5" wheels;3in1 (PT);Wheelchair (measurements PT);Wheelchair cushion (measurements PT);Other (comment) (w/c with elevating leg rests)    Recommendations for Other Services       Precautions / Restrictions Precautions Precautions: Fall Restrictions Weight Bearing Restrictions: Yes RLE Weight Bearing: Non weight bearing      Mobility  Bed Mobility Overal bed mobility: Needs Assistance Bed Mobility: Supine to Sit     Supine to sit: Mod assist;HOB elevated     General bed mobility comments: pt required increased time, heavy use of bed rails and mod A with R LE movement  Transfers Overall transfer level: Needs assistance Equipment used: Rolling walker (2 wheeled) Transfers: Sit to/from [facility] Sit to Stand: Min assist Stand pivot transfers: Min assist       General transfer comment: pt required increased time, VC'ing for bilateral hand placement and min A to achieve full standing and to maintain NWB R LE  Ambulation/Gait                 Stairs            Wheelchair Mobility    Modified Rankin (Stroke Patients Only)       Balance Overall balance assessment: Needs assistance Sitting-balance support: Feet supported;Bilateral upper extremity supported Sitting balance-Leahy Scale: Poor   Postural control: Posterior lean Standing balance support: During functional activity;Bilateral upper extremity supported Standing balance-Leahy Scale: Poor Standing balance comment: pt heavily reliant on bilateral UEs on RW                             Pertinent Vitals/Pain Pain Assessment: 0-10 Pain Score: 7  Pain Location: R LE Pain Descriptors / Indicators: Guarding;Grimacing;Moaning Pain Intervention(s): Monitored during session;Repositioned    Home Living Family/patient expects to be discharged to:: Private residence Living Arrangements: Spouse/significant other;Children Available Help at Discharge: Family;Available PRN/intermittently Type of Home: House Home Access: Stairs to enter Entrance Stairs-Rails: Right;Left;Can reach both Entrance Stairs-Number of Steps: 3 Home Layout: Two level;Able to live on main level with bedroom/bathroom Home Equipment: Kasandra Knudsen - single point      Prior Function Level of Independence: Independent               Hand Dominance   Dominant Hand: Right    Extremity/Trunk Assessment   Upper Extremity Assessment: Defer to OT evaluation           Lower Extremity Assessment: RLE deficits/detail RLE Deficits / Details: Pt with ex fix on R LE spanning across knee joint. Pt is NWB R LE.  Sensation to light touch on toes is impaired.    Cervical / Trunk Assessment: Normal  Communication   Communication: No difficulties  Cognition Arousal/Alertness: Lethargic;Suspect due to medications Behavior During Therapy: [facility] for tasks assessed/performed Overall Cognitive Status: Within Functional Limits for tasks assessed                       General Comments      Exercises Other Exercises Other Exercises: AAROM R toe flexion/extension x10   Assessment/Plan    PT Assessment Patient needs continued PT services  PT Problem List Decreased strength;Decreased range of motion;Decreased activity tolerance;Decreased balance;Decreased mobility;Decreased coordination;Decreased knowledge of use of DME;Pain          PT Treatment Interventions DME instruction;Gait training;Stair training;Functional mobility training;Therapeutic activities;Therapeutic exercise;Balance training;Neuromuscular re-education;Patient/family education    PT Goals (Current goals can be found in the Care Plan section)  Acute Rehab PT Goals Patient Stated Goal: return home PT Goal Formulation: With patient/family Time For Goal Achievement: 03/06/16 Potential to Achieve Goals: Good    Frequency Min 5X/week   Barriers to discharge        Co-evaluation PT/OT/SLP Co-Evaluation/Treatment: Yes Reason for Co-Treatment: For patient/therapist safety PT goals addressed during session: Mobility/safety with mobility;Balance;Proper use of DME;Strengthening/ROM         End of Session Equipment Utilized During Treatment: Gait belt Activity Tolerance: Patient limited by pain;Patient limited by lethargy;Patient limited by fatigue Patient left: in chair;with call bell/phone within reach;with family/visitor present;with SCD's reapplied Nurse Communication: Mobility status         Time: 1209-1253 PT Time Calculation (min) (ACUTE ONLY): 44 min   Charges:   PT Evaluation $PT Eval Moderate Complexity: 1 Procedure PT Treatments $Therapeutic Activity: 8-22 mins   PT G CodesClearnce Sorrel Alcon 02/28/2016, 1:08 PM Sherie Don, [place], DPT [phone]

## 2016-02-28 NOTE — Evaluation (Signed)
Occupational Therapy Evaluation Patient Details Name: Lawrence [name] MRN: [id number] DOB: 08/23/1965 Today's Date: 02/28/2016    History of Present Illness Pt is a 50 y/o male admitted after falling from his ladder, sustaining a tibial plateau fx. Pt now with ex fix on L LE. No pertinent PMH.   Clinical Impression   Pt is very pleasant and willing to work with therapy. During today's session, it was Pt's first time out of bed. Pt presents with below deficits in ADL and will benefit from skilled OT intervention in the acute care setting to increase independence in ADL and IADL. In Addition to the Pt, caregivers/family will also benefit from education in compensatory strategies and safe use of DME and adaptive equipment for safety and independence. OT to follow acutely.    Follow Up Recommendations  Home health OT;Supervision/Assistance - 24 hour    Equipment Recommendations  3 in 1 bedside comode    Recommendations for Other Services       Precautions / Restrictions Precautions Precautions: Fall Restrictions Weight Bearing Restrictions: Yes RLE Weight Bearing: Non weight bearing      Mobility Bed Mobility Overal bed mobility: Needs Assistance Bed Mobility: Supine to Sit     Supine to sit: Mod assist;HOB elevated     General bed mobility comments: pt required increased time, heavy use of bed rails and mod A with R LE movement  Transfers Overall transfer level: Needs assistance Equipment used: Rolling walker (2 wheeled) Transfers: Sit to/from [facility] Sit to Stand: Min assist Stand pivot transfers: Min assist       General transfer comment: pt required increased time, VC'ing for bilateral hand placement and min A to achieve full standing and to maintain NWB R LE    Balance Overall balance assessment: Needs assistance Sitting-balance support: Feet supported;Bilateral upper extremity supported Sitting balance-Leahy Scale: Poor   Postural  control: Posterior lean Standing balance support: Bilateral upper extremity supported;During functional activity Standing balance-Leahy Scale: Poor Standing balance comment: Pt relied on BUE on RW to support himself                            ADL Overall ADL's : Needs assistance/impaired Eating/Feeding: Set up;Sitting   Grooming: Wash/dry face;Oral care;Applying deodorant;Brushing hair;Set up;Sitting Grooming Details (indicate cue type and reason): sitting in recliner Upper Body Bathing: Set up;Sitting   Lower Body Bathing: Minimal assistance;With caregiver independent assisting;Bed level Lower Body Bathing Details (indicate cue type and reason): Pt unable to reach feet Upper Body Dressing : Set up;Sitting       Toilet Transfer: +2 for safety/equipment;Minimal assistance Toilet Transfer Details (indicate cue type and reason): simulated in recliner         Functional mobility during ADLs: +2 for safety/equipment;Rolling walker General ADL Comments: Pt with external fixation, strong, but in tremendous amount of pain with mobility. frequent vc's to remind of weight bearing status. Pt is good about communiating with therapists to let us know where he needed support     Vision     Perception     Praxis      Pertinent Vitals/Pain Pain Assessment: 0-10 Pain Score: 7  Pain Location: R LE Pain Descriptors / Indicators: Grimacing;Guarding;Moaning Pain Intervention(s): Monitored during session;Repositioned;Premedicated before session     Hand Dominance Right   Extremity/Trunk Assessment Upper Extremity Assessment Upper Extremity Assessment: Overall WFL for tasks assessed   Lower Extremity Assessment Lower Extremity Assessment: RLE deficits/detail RLE  Deficits / Details: Pt with ex fix on R LE spanning across knee joint. Pt is NWB R LE. Sensation to light touch on toes is impaired. RLE: Unable to fully assess due to pain;Unable to fully assess due to  immobilization   Cervical / Trunk Assessment Cervical / Trunk Assessment: Normal   Communication Communication Communication: No difficulties   Cognition Arousal/Alertness: Lethargic;Suspect due to medications Behavior During Therapy: WFL for tasks assessed/performed Overall Cognitive Status: Within Functional Limits for tasks assessed                     General Comments       Exercises Exercises: Other exercises Other Exercises Other Exercises: AAROM R toe flexion/extension x10   Shoulder Instructions      Home Living Family/patient expects to be discharged to:: Private residence Living Arrangements: Spouse/significant other;Children Available Help at Discharge: Family;Available PRN/intermittently Type of Home: House Home Access: Stairs to enter [employer] of Steps: 3 Entrance Stairs-Rails: Right;Left;Can reach both Home Layout: Two level;Able to live on main level with bedroom/bathroom Alternate Level Stairs-Number of Steps: flight   Bathroom Shower/Tub: Tub/shower unit;Curtain Shower/tub characteristics: Architectural technologist: Standard Bathroom Accessibility: Yes How Accessible: Accessible via walker Home Equipment: Cane - single point          Prior Functioning/Environment Level of Independence: Independent                 OT Problem List: Decreased strength;Decreased range of motion;Decreased activity tolerance;Impaired balance (sitting and/or standing);Decreased knowledge of use of DME or AE;Decreased knowledge of precautions;Pain   OT Treatment/Interventions: Self-care/ADL training;DME and/or AE instruction;Therapeutic activities;Patient/family education;Balance training    OT Goals(Current goals can be found in the care plan section) Acute Rehab OT Goals Patient Stated Goal: return home OT Goal Formulation: With patient/family Time For Goal Achievement: 03/13/16 Potential to Achieve Goals: Good ADL Goals Pt Will Perform  Grooming: with supervision;standing Pt Will Perform Upper Body Bathing: with modified independence;sitting Pt Will Perform Lower Body Bathing: with supervision;sit to/from stand Pt Will Transfer to Toilet: with supervision;ambulating;bedside commode Pt Will Perform Toileting - Clothing Manipulation and hygiene: with modified independence;sit to/from stand Pt Will Perform Tub/Shower Transfer: Tub transfer;with min assist;with caregiver independent in assisting;3 in 1;ambulating;rolling walker  OT Frequency: Min 3X/week   Barriers to D/C:            Co-evaluation PT/OT/SLP Co-Evaluation/Treatment: Yes Reason for Co-Treatment: For patient/therapist safety PT goals addressed during session: Mobility/safety with mobility;Balance;Proper use of DME;Strengthening/ROM OT goals addressed during session: ADL's and self-care;Proper use of Adaptive equipment and DME      End of Session Equipment Utilized During Treatment: Gait belt;Rolling walker Nurse Communication: Precautions;Weight bearing status  Activity Tolerance: Patient tolerated treatment well Patient left: in chair;with call bell/phone within reach;with family/visitor present   Time: 1220-1256 OT Time Calculation (min): 36 min Charges:  OT General Charges $OT Visit: 1 Procedure OT Evaluation $OT Eval Moderate Complexity: 1 Procedure G-Codes:    Merri Ray Shep Porter 2016/03/07, 2:30 PM  Hulda Humphrey OTR/L [phone]

## 2016-02-28 NOTE — Consult Note (Signed)
Medical Consultation   Lawrence [name]  [id number]  DOB: 24-Oct-1965  DOA: 02/27/2016  PCP: No primary care provider on file.    Requesting physician: Dr. Veverly Fells (Ortho)  Reason for consultation: Diabetes Management    History of Present Illness: Lawrence [name] is an 50 y.o. Hipanic  male with a history of poorly controlled diabetes on oral agents admitted on 9/22 after falling from a ladder 7 feet onto concrete, sustaining  A comminuted displaced right tibial plateau fracture. The right knee was immobilized and pins were placed, but a closed reduction and external fixation with reconstruction is planned for Tuesday, 9/26.  Patient's blood sugars were noted to be in the 200-300's. He reports that he "usually runs in that range". He was advised as OP to start on insulin, but refused to do so. In addition, he eats sugar rich foods, but he is planning to develop a healthier lifestyle. Denies fevers, chills, night sweats, vision changes, or mucositis. Denies any respiratory complaints. Denies any chest pain or palpitations. Denies left  lower extremity swelling. The right lower extremity pain is well controlled with current pain management. Denies nausea, heartburn .Last bowel movement on 9/21 . Denies abdominal pain. Appetite is normal. Denies any dysuria. Denies abnormal skin rashes, or neuropathy. Denies any bleeding issues such as epistaxis, hematemesis, hematuria or hematochezia.   Review of Systems:  As per HPI otherwise 10 point review of systems negative.     Past Medical History: Past Medical History:  Diagnosis Date  . Diabetes mellitus without complication [facility])     Past Surgical History: History reviewed. No pertinent surgical history.   Allergies:  No Known Allergies   Social History: Social History   Social History  . Marital status: Married    Spouse name: N/A  . Number of children: N/A  . Years of education: N/A   Occupational History  . Not on  file.   Social History Main Topics  . Smoking status: Never Smoker  . Smokeless tobacco: Never Used  . Alcohol use Yes  . Drug use: No  . Sexual activity: Yes   Other Topics Concern  . Not on file   Social History Narrative  . No narrative on file    Originally from [place].    Family History: History reviewed. No pertinent family history.   Physical Exam: Vitals:   02/27/16 1944 02/27/16 2020 02/28/16 0100 02/28/16 0500  BP: (!) 164/87 (!) 147/97 136/78 130/84  Pulse: (!) 120 (!) 108 (!) 101 100  Resp:  18 16 17   Temp: 98.1 F (36.7 C) 99.4 F (37.4 C) 98.7 F (37.1 C) 99.3 F (37.4 C)  TempSrc:  Oral Oral Oral  SpO2: 100% 99% 100% 100%    Constitutional: Appears calm,  alert and awake, oriented x3, not in any acute distress. Eyes: PERLA, EOMI, irises appear normal, anicteric sclera,  ENMT: external ears and nose appear normal, normal hearing or hard of hearing. Lips appears normal, oropharynx mucosa, tongue, posterior pharynx appear normal  Neck: neck appears normal, no masses, normal ROM, no thyromegaly, no JVD  CVS: S1-S2 clear, no murmur rubs or gallops, no LE edema, normal pedal pulses  Respiratory: clear to auscultation bilaterally, no wheezing, rales or rhonchi. Respiratory effort normal. No accessory muscle use.  Abdomen: soft nontender, nondistended, normal bowel sounds, Musculoskeletal:   Left  Lower and bilateral upper Joint/bones/muscle exam, strength, contractures or  atrophy. Right knee bandadged and immobilized, some swelling noted, Neuro: Cranial nerves II-XII intact, strength, sensation, reflexes Psych: judgement and insight appear normal, stable mood and affect, mental status Skin: no rashes or lesions or ulcers, no induration or nodules   Data reviewed:  I have personally reviewed following labs and imaging studies Labs:  CBC:  Recent Labs Lab 02/27/16 1600  WBC 11.7*  NEUTROABS 9.6*  HGB 13.8  HCT 40.2  MCV 86.8  PLT 192    Basic  Metabolic Panel:  Recent Labs Lab 02/27/16 1600  NA 135  K 4.2  CL 103  CO2 22  GLUCOSE 356*  BUN 14  CREATININE 0.68  CALCIUM 9.0   GFR CrCl cannot be calculated (Unknown ideal weight.). Liver Function Tests: No results for input(s): AST, ALT, ALKPHOS, BILITOT, PROT, ALBUMIN in the last 168 hours. No results for input(s): LIPASE, AMYLASE in the last 168 hours. No results for input(s): AMMONIA in the last 168 hours. Coagulation profile  Recent Labs Lab 02/27/16 1914  INR 1.11    Cardiac Enzymes: No results for input(s): CKTOTAL, CKMB, CKMBINDEX, TROPONINI in the last 168 hours. BNP: Invalid input(s): POCBNP CBG:  Recent Labs Lab 02/27/16 1640 02/27/16 1907 02/27/16 2054 02/28/16 0700  GLUCAP 288* 325* 305* 235*   D-Dimer No results for input(s): DDIMER in the last 72 hours. Hgb A1c No results for input(s): HGBA1C in the last 72 hours. Lipid Profile No results for input(s): CHOL, HDL, LDLCALC, TRIG, CHOLHDL, LDLDIRECT in the last 72 hours. Thyroid function studies No results for input(s): TSH, T4TOTAL, T3FREE, THYROIDAB in the last 72 hours.  Invalid input(s): FREET3 Anemia work up No results for input(s): VITAMINB12, FOLATE, FERRITIN, TIBC, IRON, RETICCTPCT in the last 72 hours. Urinalysis No results found for: COLORURINE, APPEARANCEUR, [place], [place], [place], [place], [place], [place], PROTEINUR, UROBILINOGEN, NITRITE, LEUKOCYTESUR   Sepsis Labs Invalid input(s): PROCALCITONIN,  WBC,  LACTICIDVEN Microbiology Recent Results (from the past 240 hour(s))  Surgical pcr screen     Status: None   Collection Time: 02/27/16  5:57 PM  Result Value Ref Range Status   MRSA, PCR NEGATIVE NEGATIVE Final   Staphylococcus aureus NEGATIVE NEGATIVE Final    Comment:        The Xpert SA Assay (FDA approved for NASAL specimens in patients over 64 years of age), is one component of a comprehensive surveillance program.  Test performance has been  validated by [facility] for patients greater than or equal to 100 year old. It is not intended to diagnose infection nor to guide or monitor treatment.        Inpatient Medications:   Scheduled Meds: .  ceFAZolin (ANCEF) IV  2 g Intravenous Q6H  . docusate sodium  100 mg Oral BID  . enoxaparin (LOVENOX) injection  40 mg Subcutaneous Q24H  . insulin aspart  0-15 Units Subcutaneous TID WC  . insulin aspart  0-5 Units Subcutaneous QHS  . insulin aspart  4 Units Subcutaneous TID WC  . metFORMIN  1,000 mg Oral BID WC   Continuous Infusions: . sodium chloride 75 mL/hr at 02/28/16 0819  . lactated ringers       Radiological Exams on Admission: Dg Chest 2 View  Result Date: 02/27/2016 CLINICAL DATA:  Preoperative evaluation for lower extremity fracture. EXAM: CHEST  2 VIEW COMPARISON:  None. FINDINGS: There is no edema or consolidation. Heart is upper normal in size with pulmonary vascularity within normal limits. No adenopathy. No pneumothorax. There is mild degenerative change  in the thoracic spine. IMPRESSION: No edema or consolidation. Electronically Signed   By: Lowella Grip III M.D.   On: 02/27/2016 17:27   Dg Knee 1-2 Views Right  Result Date: 02/27/2016 CLINICAL DATA:  External fixation of right knee fracture. Initial encounter. EXAM: RIGHT KNEE - 1-2 VIEW; DG C-ARM 61-120 MIN COMPARISON:  Right knee radiographs performed earlier today at 3:25 p.m. FINDINGS: Six fluoroscopic C-arm images are provided from the OR, demonstrating placement of external fixation hardware at the distal femur and mid tibia, with the comminuted fracture of the proximal tibia seen in improved alignment. IMPRESSION: Status post external fixation of the comminuted proximal tibial fracture. Electronically Signed   By: Garald Balding M.D.   On: 02/27/2016 19:27   Dg Knee 1-2 Views Right  Result Date: 02/27/2016 CLINICAL DATA:  Injury.  Pain and deformity. EXAM: RIGHT KNEE - 1-2 VIEW COMPARISON:  No  recent. FINDINGS: Comminuted, displaced, angulated fracture of the of proximal tibia is noted. Fracture extends into the joint space. Knee joint effusion noted. IMPRESSION: Comminuted prominent displaced and angulated fracture of the of proximal tibia with extension of the fracture is into the joint space. Knee joint effusion noted. Electronically Signed   By: Marcello Moores  Register   On: 02/27/2016 15:53   Dg Tibia/fibula Right  Result Date: 02/27/2016 CLINICAL DATA:  50 year old who fell approximately 5 feet off of a ladder in the garage earlier today. Injury to the right lower leg. Initial encounter. EXAM: RIGHT TIBIA AND FIBULA - 2 VIEW COMPARISON:  Right knee x-ray obtained concurrently. FINDINGS: Comminuted multipart fracture involving the proximal tibia which extends from the lateral plateau to the medial cortex of the proximal metaphysis, such that the medial tibial plateau is a free fragment. No fractures elsewhere involving the tibia or fibula. IMPRESSION: Comminuted multipart fracture involving the proximal tibia extending from the lateral tibial plateau to the medial cortex of the proximal metaphysis. The medial tibial plateau is present as a free fragment. Electronically Signed   By: Evangeline Dakin M.D.   On: 02/27/2016 15:48   Dg Ankle Complete Right  Result Date: 02/27/2016 CLINICAL DATA:  50 year old who fell approximately 5 feet off of a ladder in a garage earlier today. Injury to the right lower leg. Initial encounter. EXAM: RIGHT ANKLE - COMPLETE 3+ VIEW COMPARISON:  None. FINDINGS: No evidence of acute fracture. Ankle mortise intact with well-preserved joint space. Well-preserved bone mineral density. No intrinsic osseous abnormalities. No visible joint effusion. IMPRESSION: Normal examination. Electronically Signed   By: Evangeline Dakin M.D.   On: 02/27/2016 15:45   Ct Knee Right Wo Contrast  Result Date: 02/28/2016 CLINICAL DATA:  Status post external fixation of right tibial plateau  fracture. Initial encounter. EXAM: CT OF THE RIGHT KNEE WITHOUT CONTRAST TECHNIQUE: Multidetector CT imaging of the right knee was performed according to the standard protocol. Multiplanar CT image reconstructions were also generated. COMPARISON:  Right knee radiographs performed earlier today at 3:25 p.m. FINDINGS: Bones/Joint/Cartilage There is a comminuted fracture of the tibial plateau, with metaphyseal disassociation and extension into the diaphysis. This is compatible with an AO/OTA type C3 fracture, or a Schatzker type VI fracture. There is approximately 1.7 cm of depression of a tibial spine fragment at the lateral tibial plateau. Several fracture lines extend through the tibial spine. The proximal fibula appears intact. The distal femur remains intact. Associated external fixation hardware is partially imaged. The cartilage is difficult to fully assess on CT. Ligaments Suboptimally assessed by CT. Muscles  and Tendons There appears to be vague soft tissue edema tracking along the proximal medial gastrocnemius musculature, concerning for intramuscular hematoma. Would follow the patient's symptoms carefully, to exclude development of compartment syndrome. Soft tissues Note is made of soft tissue injury about the knee. A small to moderate lipohemarthrosis is seen. The vasculature is not well assessed without contrast. IMPRESSION: 1. Comminuted fracture of the tibial plateau, with metaphyseal dissociation and extension into the diaphysis. This is compatible with an AO/OTA type C3 fracture, or a Schatzker type VI fracture. 1.7 cm depression of a tibial spine fragment, at the lateral tibial plateau. Several fracture lines extend through the tibial spine. 2. Vague soft tissue edema tracking about the proximal medial gastrocnemius musculature, concerning for intramuscular hematoma. Would follow the patient's symptoms carefully, to exclude development of compartment syndrome. 3. Small to moderate lipohemarthrosis  noted. 4. Soft tissue injury noted about the knee. Electronically Signed   By: Garald Balding M.D.   On: 02/28/2016 03:37   Dg C-arm 1-60 Min  Result Date: 02/27/2016 CLINICAL DATA:  External fixation of right knee fracture. Initial encounter. EXAM: RIGHT KNEE - 1-2 VIEW; DG C-ARM 61-120 MIN COMPARISON:  Right knee radiographs performed earlier today at 3:25 p.m. FINDINGS: Six fluoroscopic C-arm images are provided from the OR, demonstrating placement of external fixation hardware at the distal femur and mid tibia, with the comminuted fracture of the proximal tibia seen in improved alignment. IMPRESSION: Status post external fixation of the comminuted proximal tibial fracture. Electronically Signed   By: Garald Balding M.D.   On: 02/27/2016 19:27    Impression/Recommendations Active Problems:   Tibial plateau fracture   Diabetes (HCC)  Type II Diabetes Current blood sugar level is 235, on admission was 356 No results found for: HGBA1C Hgb A1C Start Lantus 10 U daily, Novolog 4 units before meals  and sensitive SSI,  Continue Metformin at 1000 mg bid Heart healthy carb modified diet.  Diabetes educator consult  Other medical issues includnig orthopedic procedures and pain, bowel control  as per admitting team  Thank you for this consultation.  Our [facility] hospitalist team will follow the patient with you.   Time Spent:   Rondel Jumbo PA-C [facility] 02/28/2016, 10:33 AM

## 2016-02-28 NOTE — Plan of Care (Signed)
Problem: Pain Managment: Goal: General experience of comfort will improve Outcome: Not Progressing Medicated three time for pain with mild relief, resting in the bed with eyes closed at present time, no acute distress noted

## 2016-02-28 NOTE — Op Note (Signed)
NAMEREDRICK, Lawrence [name] NO.:  [id number]  MEDICAL RECORD NO.:  [id number]  LOCATION:  5N03C                        FACILITY:  [facility]  PHYSICIAN:  Doran Heater. Veverly Fells, M.D. DATE OF BIRTH:  [birth year]  DATE OF PROCEDURE:  02/27/2016 DATE OF DISCHARGE:                              OPERATIVE REPORT   PREOPERATIVE DIAGNOSIS:  Comminuted, displaced right tibial plateau fracture.  POSTOPERATIVE DIAGNOSIS:  Comminuted, displaced right tibial plateau fracture.  PROCEDURE PERFORMED:  Closed reduction and external fixation of right tibial plateau fracture (spanning external fixator).  ATTENDING SURGEON:  Doran Heater. Veverly Fells, M.D.  ASSISTANT:  None.  ANESTHESIA:  General anesthesia was used.  ESTIMATED BLOOD LOSS:  Minimal.  FLUID REPLACEMENT:  500 mL crystalloid.  INSTRUMENT COUNTS:  Correct.  COMPLICATIONS:  There were no complications.  ANTIBIOTICS:  Perioperative antibiotics were given.  INDICATIONS:  The patient is a 50 year old male, who presents with a fall off a ladder injuring his right knee, the patient landed directly on his right foot jamming his knee together and the patient complained immediate severe pain in the knee, unable to bear weight, presented to the emergency room and x-rays demonstrated a comminuted displaced tibial plateau fracture with significant impaction of the femur and of the tibia.  I discussed with the patient the need to reduce his knee, basically pull the femur out of the tibia and apply a spanning external fixator, this will allow [place] better imaging with x-rays postop as well as CT scan.  Discussed this case with Dr. Altamese Preston, who concurs with this plan.  Risks and benefits were discussed with the patient. Informed consent obtained.  DESCRIPTION OF PROCEDURE:  After an adequate level of anesthesia achieved, the patient was positioned supine on a radiolucent table.  The right leg was sterilely prepped and draped in usual  manner.  Time-out was called.  We used fluoroscopy intraoperatively to properly locate our Schanz pins or threaded pins to place in the tibia and the femur.  Our plan was for spanning external fixator.  I applied manual direct traction and under C-arm, visualized appropriate reduction of the fracture at least pulling the femur out of the tibia and getting proper alignment of the joint line.  We then placed two half pins, these were 155-mm pins into the tibia and these were placed under power with a pin- to-bar clamp that provided fixation for two of these half pins.  So, we set the pin distance based on that bar clamp and then once we had those two pins in place and verified in terms of location of the tibia down several centimeters below the fracture site, we went ahead and provisionally locked that clamped into place.  We then went above into the femur on the diaphyseal portion of the femur and placed our initial pin under C-arm fluoroscopy into the middle portion of the femur.  We then went ahead and used again the pin-to-bar clamp and placed our pin distances based off that.  We used blunt technique to get down through the initially 15-blade scalpel skin and then blunt technique with a hemostat down through the muscle and then used drill guides to place  our Schanz pins.  Once we had the pins in proper position, we placed our clamp on the femoral pins, locked that into place provisionally, 2 fingerbreadths above the skin.  We then placed two 50-cm rods, one medially and one laterally and tightened this proximally initially, then pulled longitudinal traction until we have appropriate joint space and then locked our bars distally.  With all of the pin-to-bar clamps tightened, then we went ahead and obtained final x-rays and pictures to verify position of the fracture and then did our final tightening and then went ahead and irrigated the pin sites and then, we placed Xeroform against the  skin and then wrapped Kerlix around the pins, applying some compression at the skin entry site for the pins.  We then wrapped the entire leg with an Ace wrap.  The patient was transported in stable condition to the recovery room, having tolerated the surgery well.     Doran Heater. Veverly Fells, M.D.     SRN/MEDQ  D:  02/27/2016  T:  02/28/2016  Job:  [id number]

## 2016-02-28 NOTE — Progress Notes (Signed)
Orthopedics Progress Note  Subjective: Patient reports improvement in pain from yesterday in the right knee.  Patient does report some pain in the chest and back today.  Objective:  Vitals:   02/28/16 0100 02/28/16 0500  BP: 136/78 130/84  Pulse: (!) 101 100  Resp: 16 17  Temp: 98.7 F (37.1 C) 99.3 F (37.4 C)    General: Awake and alert  Musculoskeletal: neck non tender and back minimally tender to palpation, pulse rate 102 currently, chest with mild tenderness to compression, normal excursion, right leg compartments supple, able to do some gentle ROM work with the ankle, good pulses Neurovascularly intact  Lab Results  Component Value Date   WBC 11.7 (H) 02/27/2016   HGB 13.8 02/27/2016   HCT 40.2 02/27/2016   MCV 86.8 02/27/2016   PLT 192 02/27/2016       Component Value Date/Time   NA 135 02/27/2016 1600   K 4.2 02/27/2016 1600   CL 103 02/27/2016 1600   CO2 22 02/27/2016 1600   GLUCOSE 356 (H) 02/27/2016 1600   BUN 14 02/27/2016 1600   CREATININE 0.68 02/27/2016 1600   CALCIUM 9.0 02/27/2016 1600   GFRNONAA >60 02/27/2016 1600   GFRAA >60 02/27/2016 1600    Lab Results  Component Value Date   INR 1.11 02/27/2016    Assessment/Plan: POD #1 s/p Procedure(s): EXTERNAL FIXATION DISPLACED TIBIAL PLATEAU FRACTURE CT scan shows severe joint depression and comminution.  Will require joint line restoration and bone grafting with ORIF. Dr Marcelino Scot to assume care after weekend with tentative surgery planned for Tuesday next week. Continue DVT prophylaxis and pulmonary toilet Overhead trapeze   Doran Heater. Veverly Fells, MD 02/28/2016 9:36 AM

## 2016-02-29 LAB — GLUCOSE, CAPILLARY
Glucose-Capillary: 156 mg/dL — ABNORMAL HIGH (ref 65–99)
Glucose-Capillary: 119 mg/dL — ABNORMAL HIGH (ref 65–99)
Glucose-Capillary: 162 mg/dL — ABNORMAL HIGH (ref 65–99)
Glucose-Capillary: 126 mg/dL — ABNORMAL HIGH (ref 65–99)

## 2016-02-29 LAB — HEMOGLOBIN A1C
Hgb A1c MFr Bld: 12.2 % — ABNORMAL HIGH (ref 4.8–5.6)
Mean Plasma Glucose: 303 mg/dL

## 2016-02-29 LAB — URINALYSIS, ROUTINE W REFLEX MICROSCOPIC
Bilirubin Urine: NEGATIVE
Glucose, UA: >1000 mg/dL — AB
Ketones, ur: 15 mg/dL — AB
Leukocytes, UA: NEGATIVE
Nitrite: NEGATIVE
Protein, ur: 100 mg/dL — AB
Specific Gravity, Urine: 1.025 (ref 1.005–1.030)
pH: 5.5 (ref 5.0–8.0)

## 2016-02-29 LAB — URINE MICROSCOPIC-ADD ON: Bacteria, UA: NONE SEEN

## 2016-02-29 MED ORDER — DIPHENHYDRAMINE HCL 25 MG PO CAPS
25.0000 mg | ORAL_CAPSULE | Freq: Once | ORAL | Status: AC
  Administered 2016-02-29: 25 mg via ORAL
  Filled 2016-02-29: qty 1

## 2016-02-29 MED ORDER — LIVING WELL WITH DIABETES BOOK
Freq: Once | Status: AC
  Administered 2016-02-29: 1
  Filled 2016-02-29: qty 1

## 2016-02-29 MED ORDER — INSULIN STARTER KIT- PEN NEEDLES (ENGLISH)
1.0000 | Freq: Once | Status: AC
  Administered 2016-02-29: 1
  Filled 2016-02-29: qty 1

## 2016-02-29 NOTE — Progress Notes (Signed)
Physical Therapy Treatment Patient Details Name: Lawrence [name] MRN: [id number] DOB: 10-Jul-1965 Today's Date: 02/29/2016    History of Present Illness Pt is a 50 y/o male admitted after falling from his ladder, sustaining a tibial plateau fx. Pt now with ex fix on L LE. No pertinent PMH.    PT Comments    Pt able to walk short distance today - shoe on left helped him maintain NWB right status.    Follow Up Recommendations  Home health PT;Supervision for mobility/OOB     Equipment Recommendations  Rolling walker with 5" wheels;3in1 (PT);Wheelchair (measurements PT);Wheelchair cushion (measurements PT) (with elevating leg rests)    Recommendations for Other Services       Precautions / Restrictions Precautions Precautions: Fall Restrictions RLE Weight Bearing: Non weight bearing (right leg)    Mobility  Bed Mobility Overal bed mobility: +2 for physical assistance Bed Mobility: Supine to Sit     Supine to sit: +2 for physical assistance;Mod assist;HOB elevated        Transfers Overall transfer level: Needs assistance Equipment used: Rolling walker (2 wheeled) Transfers: Sit to/from Stand Sit to Stand: Min assist;From elevated surface         General transfer comment: pt required increased time, VC'ing for bilateral hand placement and min A to achieve full standing and to maintain NWB R LE from high bed  Ambulation/Gait Ambulation/Gait assistance: +2 safety/equipment Ambulation Distance (Feet): 8 Feet Assistive device: Rolling walker (2 wheeled) Gait Pattern/deviations: Step-to pattern     General Gait Details: pt has shoe on left foot that helped pt with his NWB status to allow him to walk short distance in room with chiar following.  pt able to maintain NWB   Stairs            Wheelchair Mobility    Modified Rankin (Stroke Patients Only)       Balance                                    Cognition Arousal/Alertness:  Awake/alert Behavior During Therapy: WFL for tasks assessed/performed Overall Cognitive Status: Within Functional Limits for tasks assessed                      Exercises      General Comments        Pertinent Vitals/Pain Pain Location: right leg 9/10 beginning of therapy and walked and repositioned and 7/10 at end of session    Home Living                      Prior Function            PT Goals (current goals can now be found in the care plan section) Progress towards PT goals: Progressing toward goals    Frequency    Min 5X/week      PT Plan Current plan remains appropriate    Co-evaluation             End of Session Equipment Utilized During Treatment: Gait belt Activity Tolerance: Patient limited by pain;Patient limited by fatigue Patient left: in chair;with call bell/phone within reach;with family/visitor present     Time: [id number] PT Time Calculation (min) (ACUTE ONLY): 20 min  Charges:  $Gait Training: 8-22 mins  G Codes:      Loyal Buba 02/29/2016, 12:04 PM 02/29/2016   Rande Lawman, PT

## 2016-02-29 NOTE — Progress Notes (Signed)
Inpatient Diabetes Program Recommendations  AACE/ADA: New Consensus Statement on Inpatient Glycemic Control (2015)  Target Ranges:  Prepandial:   less than 140 mg/dL      Peak postprandial:   less than 180 mg/dL (1-2 hours)      Critically ill patients:  140 - 180 mg/dL   Lab Results  Component Value Date   GLUCAP 156 (H) 02/29/2016    Review of Glycemic Control  Diabetes history: DM2 Outpatient Diabetes medications: Metformin 1 gm bid Current orders for Inpatient glycemic control: Lantus 10 units daily + Novolog 4 units tid meal coverage if eats 50% + Novolog correction 0-15 tid with meals  Inpatient Diabetes Program Recommendations:   Agree with medications ordered although did not have a current weight available in chart. Ordered Living Well With Diabetes book, starter kit, patient education with videos in room. Nurses, please do bedside teaching on checking CBGs and insulin administration. Please teach patient insulin pen administration and assist patient with DM videos.  Thank you, Nani Gasser. Hanks, RN, MSN, CDE Inpatient Glycemic Control Team Team Pager [phone] (8am-5pm) 02/29/2016 9:08 AM

## 2016-02-29 NOTE — Progress Notes (Signed)
Subjective: 2 Days Post-Op Procedure(s) (LRB): EXTERNAL FIXATION LEG (Right) Patient reports pain as 2 on 0-10 scale.Doing very well today.Circulation intact.Dr. Marcelino Scot to take over this case.  Objective: Vital signs in last 24 hours: Temp:  [99.6 F (37.6 C)-100.4 F (38 C)] 99.6 F (37.6 C) (09/24 0653) Pulse Rate:  [94-109] 94 (09/24 0653) Resp:  [16-17] 16 (09/24 0653) BP: (134-142)/(70-84) 142/70 (09/24 0653) SpO2:  [97 %-100 %] 100 % (09/24 0653)  Intake/Output from previous day: 09/23 0701 - 09/24 0700 In: 1876.3 [I.V.:1876.3] Out: -  Intake/Output this shift: No intake/output data recorded.   Recent Labs  02/27/16 1600  HGB 13.8    Recent Labs  02/27/16 1600  WBC 11.7*  RBC 4.63  HCT 40.2  PLT 192    Recent Labs  02/27/16 1600  NA 135  K 4.2  CL 103  CO2 22  BUN 14  CREATININE 0.68  GLUCOSE 356*  CALCIUM 9.0    Recent Labs  02/27/16 1914  INR 1.11    Dorsiflexion/Plantar flexion intact  Assessment/Plan: 2 Days Post-Op Procedure(s) (LRB): EXTERNAL FIXATION LEG (Right) Waiting for Dr. Marcelino Scot to evaluate.  GIOFFRE,RONALD A 02/29/2016, 8:10 AM

## 2016-02-29 NOTE — Progress Notes (Signed)
[facility] PROGRESS NOTE  Lawrence [name] [id number] DOB: 1966-01-01 DOA: 02/27/2016  PCP: No primary care provider on file.  Brief History/Interval Summary: 50 year old male of Hispanic origin who has a past medical history of poorly controlled diabetes who was admitted on September 22 after sustaining injury as a result of a fall from a ladder onto concrete. He was found to have communicated displaced right tibial plateau fracture. Reconstruction is planned for Tuesday, September 26. Medicine was consulted to assist with management of diabetes.  Procedures: None yet  Subjective/Interval History: Patient continues to have pain in his right lower extremity. I discussed his diabetes management with him. Apparently he has been told by outpatient providers that he will need to get on insulin. However, patient has been reluctant. He expects that his diabetes will get better on its own. I told him very clearly that this was not going to happen and that he should consider being on insulin to prevent complications of hyperglycemia such as blindness, kidney disease, heart disease, neuropathy.  ROS: Denies any nausea or vomiting.  Objective:  Vital Signs  Vitals:   02/28/16 0500 02/28/16 1435 02/28/16 2037 02/29/16 0653  BP: 130/84 138/84 134/76 (!) 142/70  Pulse: 100 (!) 109 (!) 105 94  Resp: 17 17 16 16   Temp: 99.3 F (37.4 C) (!) 100.4 F (38 C) (!) 100.4 F (38 C) 99.6 F (37.6 C)  TempSrc: Oral Oral Oral Oral  SpO2: 100% 99% 97% 100%    Intake/Output Summary (Last 24 hours) at 02/29/16 1219 Last data filed at 02/29/16 [id number]  Gross per 24 hour  Intake          1876.25 ml  Output                0 ml  Net          1876.25 ml   There were no vitals filed for this visit.  General appearance: alert, cooperative, appears stated age, no distress and moderately obese Resp: clear to auscultation bilaterally Cardio: regular rate and rhythm, S1, S2 normal, no murmur, click, rub  or gallop GI: soft, non-tender; bowel sounds normal; no masses,  no organomegaly Extremities: Right lower extremity is immobilized. Neurologic: No focal deficits.  Lab Results:  Data Reviewed: I have personally reviewed following labs and imaging studies  CBC:  Recent Labs Lab 02/27/16 1600  WBC 11.7*  NEUTROABS 9.6*  HGB 13.8  HCT 40.2  MCV 86.8  PLT [id number]    Basic Metabolic Panel:  Recent Labs Lab 02/27/16 1600  NA 135  K 4.2  CL 103  CO2 22  GLUCOSE 356*  BUN 14  CREATININE 0.68  CALCIUM 9.0    GFR: CrCl cannot be calculated (Unknown ideal weight.).  Coagulation Profile:  Recent Labs Lab 02/27/16 1914  INR 1.11    HbA1C:  Recent Labs  02/28/16 1056  HGBA1C 12.2*    CBG:  Recent Labs Lab 02/28/16 1631 02/28/16 1649 02/28/16 2040 02/29/16 0657 02/29/16 1123  GLUCAP 211* 215* 145* 156* 119*     Recent Results (from the past 240 hour(s))  Surgical pcr screen     Status: None   Collection Time: 02/27/16  5:57 PM  Result Value Ref Range Status   MRSA, PCR NEGATIVE NEGATIVE Final   Staphylococcus aureus NEGATIVE NEGATIVE Final    Comment:        The Xpert SA Assay (FDA approved for NASAL specimens in patients over 58 years of age), is  one component of a comprehensive surveillance program.  Test performance has been validated by [facility] for patients greater than or equal to 4 year old. It is not intended to diagnose infection nor to guide or monitor treatment.       Radiology Studies: Dg Chest 2 View  Result Date: 02/27/2016 CLINICAL DATA:  Preoperative evaluation for lower extremity fracture. EXAM: CHEST  2 VIEW COMPARISON:  None. FINDINGS: There is no edema or consolidation. Heart is upper normal in size with pulmonary vascularity within normal limits. No adenopathy. No pneumothorax. There is mild degenerative change in the thoracic spine. IMPRESSION: No edema or consolidation. Electronically Signed   By: Lowella Grip  III M.D.   On: 02/27/2016 17:27   Dg Knee 1-2 Views Right  Result Date: 02/27/2016 CLINICAL DATA:  External fixation of right knee fracture. Initial encounter. EXAM: RIGHT KNEE - 1-2 VIEW; DG C-ARM 61-120 MIN COMPARISON:  Right knee radiographs performed earlier today at 3:25 p.m. FINDINGS: Six fluoroscopic C-arm images are provided from the OR, demonstrating placement of external fixation hardware at the distal femur and mid tibia, with the comminuted fracture of the proximal tibia seen in improved alignment. IMPRESSION: Status post external fixation of the comminuted proximal tibial fracture. Electronically Signed   By: Garald Balding M.D.   On: 02/27/2016 19:27   Dg Knee 1-2 Views Right  Result Date: 02/27/2016 CLINICAL DATA:  Injury.  Pain and deformity. EXAM: RIGHT KNEE - 1-2 VIEW COMPARISON:  No recent. FINDINGS: Comminuted, displaced, angulated fracture of the of proximal tibia is noted. Fracture extends into the joint space. Knee joint effusion noted. IMPRESSION: Comminuted prominent displaced and angulated fracture of the of proximal tibia with extension of the fracture is into the joint space. Knee joint effusion noted. Electronically Signed   By: Marcello Moores  Register   On: 02/27/2016 15:53   Dg Tibia/fibula Right  Result Date: 02/27/2016 CLINICAL DATA:  50 year old who fell approximately 5 feet off of a ladder in the garage earlier today. Injury to the right lower leg. Initial encounter. EXAM: RIGHT TIBIA AND FIBULA - 2 VIEW COMPARISON:  Right knee x-ray obtained concurrently. FINDINGS: Comminuted multipart fracture involving the proximal tibia which extends from the lateral plateau to the medial cortex of the proximal metaphysis, such that the medial tibial plateau is a free fragment. No fractures elsewhere involving the tibia or fibula. IMPRESSION: Comminuted multipart fracture involving the proximal tibia extending from the lateral tibial plateau to the medial cortex of the proximal metaphysis.  The medial tibial plateau is present as a free fragment. Electronically Signed   By: Evangeline Dakin M.D.   On: 02/27/2016 15:48   Dg Ankle Complete Right  Result Date: 02/27/2016 CLINICAL DATA:  50 year old who fell approximately 5 feet off of a ladder in a garage earlier today. Injury to the right lower leg. Initial encounter. EXAM: RIGHT ANKLE - COMPLETE 3+ VIEW COMPARISON:  None. FINDINGS: No evidence of acute fracture. Ankle mortise intact with well-preserved joint space. Well-preserved bone mineral density. No intrinsic osseous abnormalities. No visible joint effusion. IMPRESSION: Normal examination. Electronically Signed   By: Evangeline Dakin M.D.   On: 02/27/2016 15:45   Ct Knee Right Wo Contrast  Result Date: 02/28/2016 CLINICAL DATA:  Status post external fixation of right tibial plateau fracture. Initial encounter. EXAM: CT OF THE RIGHT KNEE WITHOUT CONTRAST TECHNIQUE: Multidetector CT imaging of the right knee was performed according to the standard protocol. Multiplanar CT image reconstructions were also generated. COMPARISON:  Right knee radiographs performed earlier today at 3:25 p.m. FINDINGS: Bones/Joint/Cartilage There is a comminuted fracture of the tibial plateau, with metaphyseal disassociation and extension into the diaphysis. This is compatible with an AO/OTA type C3 fracture, or a Schatzker type VI fracture. There is approximately 1.7 cm of depression of a tibial spine fragment at the lateral tibial plateau. Several fracture lines extend through the tibial spine. The proximal fibula appears intact. The distal femur remains intact. Associated external fixation hardware is partially imaged. The cartilage is difficult to fully assess on CT. Ligaments Suboptimally assessed by CT. Muscles and Tendons There appears to be vague soft tissue edema tracking along the proximal medial gastrocnemius musculature, concerning for intramuscular hematoma. Would follow the patient's symptoms  carefully, to exclude development of compartment syndrome. Soft tissues Note is made of soft tissue injury about the knee. A small to moderate lipohemarthrosis is seen. The vasculature is not well assessed without contrast. IMPRESSION: 1. Comminuted fracture of the tibial plateau, with metaphyseal dissociation and extension into the diaphysis. This is compatible with an AO/OTA type C3 fracture, or a Schatzker type VI fracture. 1.7 cm depression of a tibial spine fragment, at the lateral tibial plateau. Several fracture lines extend through the tibial spine. 2. Vague soft tissue edema tracking about the proximal medial gastrocnemius musculature, concerning for intramuscular hematoma. Would follow the patient's symptoms carefully, to exclude development of compartment syndrome. 3. Small to moderate lipohemarthrosis noted. 4. Soft tissue injury noted about the knee. Electronically Signed   By: Garald Balding M.D.   On: 02/28/2016 03:37   Dg C-arm 1-60 Min  Result Date: 02/27/2016 CLINICAL DATA:  External fixation of right knee fracture. Initial encounter. EXAM: RIGHT KNEE - 1-2 VIEW; DG C-ARM 61-120 MIN COMPARISON:  Right knee radiographs performed earlier today at 3:25 p.m. FINDINGS: Six fluoroscopic C-arm images are provided from the OR, demonstrating placement of external fixation hardware at the distal femur and mid tibia, with the comminuted fracture of the proximal tibia seen in improved alignment. IMPRESSION: Status post external fixation of the comminuted proximal tibial fracture. Electronically Signed   By: Garald Balding M.D.   On: 02/27/2016 19:27     Medications:  Scheduled: . docusate sodium  100 mg Oral BID  . enoxaparin (LOVENOX) injection  40 mg Subcutaneous Q24H  . insulin aspart  0-15 Units Subcutaneous TID WC  . insulin aspart  4 Units Subcutaneous TID AC  . insulin glargine  10 Units Subcutaneous QHS   Continuous: . sodium chloride 75 mL/hr (02/29/16 0458)  . lactated ringers      [id number] **OR** acetaminophen, HYDROmorphone (DILAUDID) injection, methocarbamol **OR** methocarbamol (ROBAXIN)  IV, metoCLOPramide **OR** metoCLOPramide (REGLAN) injection, ondansetron **OR** ondansetron (ZOFRAN) IV, oxyCODONE, polyethylene glycol  Assessment/Plan:  Active Problems:   Tibial plateau fracture   Diabetes ([place])    Poorly controlled diabetes mellitus type II. HbA1c is extremely high at 12.2. His blood glucose levels are better controlled this morning after he was started on Lantus yesterday. He was also continued on his metformin. Will discontinue metformin for now. Can be resumed when he is discharged. Diabetes education. Check lipid panel in the morning. Check UA to detect proteinuria. He will need close outpatient follow-up with primary providers for better management of diabetes. He will benefit from being discharged on insulin such as Lantus. All of this was discussed in detail with the patient along with his daughter. Education was provided. He agrees to take insulin.  Tibial plateau fracture Management per orthopedics.  Thank you for this consult. We will continue to follow this patient on a daily basis while he is hospitalized.    LOS: 2 days   [facility] Pager [phone] 02/29/2016, 12:19 PM  If 7PM-7AM, please contact night-coverage at [email or web address], password [facility]

## 2016-03-01 ENCOUNTER — Inpatient Hospital Stay (HOSPITAL_COMMUNITY): Payer: BLUE CROSS/BLUE SHIELD

## 2016-03-01 ENCOUNTER — Encounter (HOSPITAL_COMMUNITY): Payer: Self-pay | Admitting: Orthopedic Surgery

## 2016-03-01 DIAGNOSIS — R509 Fever, unspecified: Secondary | ICD-10-CM

## 2016-03-01 LAB — LIPID PANEL
Cholesterol: 193 mg/dL (ref 0–200)
HDL: 33 mg/dL — ABNORMAL LOW (ref >40)
LDL Cholesterol: 139 mg/dL — ABNORMAL HIGH (ref 0–99)
Total CHOL/HDL Ratio: 5.8 RATIO
Triglycerides: 107 mg/dL (ref <150)
VLDL: 21 mg/dL (ref 0–40)

## 2016-03-01 LAB — BASIC METABOLIC PANEL
Anion gap: 6 (ref 5–15)
BUN: 10 mg/dL (ref 6–20)
CO2: 27 mmol/L (ref 22–32)
Calcium: 8 mg/dL — ABNORMAL LOW (ref 8.9–10.3)
Chloride: 98 mmol/L — ABNORMAL LOW (ref 101–111)
Creatinine, Ser: 0.58 mg/dL — ABNORMAL LOW (ref 0.61–1.24)
GFR calc Af Amer: >60 mL/min (ref >60)
GFR calc non Af Amer: >60 mL/min (ref >60)
Glucose, Bld: 122 mg/dL — ABNORMAL HIGH (ref 65–99)
Potassium: 3.4 mmol/L — ABNORMAL LOW (ref 3.5–5.1)
Sodium: 131 mmol/L — ABNORMAL LOW (ref 135–145)

## 2016-03-01 LAB — GLUCOSE, CAPILLARY
Glucose-Capillary: 124 mg/dL — ABNORMAL HIGH (ref 65–99)
Glucose-Capillary: 182 mg/dL — ABNORMAL HIGH (ref 65–99)
Glucose-Capillary: 175 mg/dL — ABNORMAL HIGH (ref 65–99)
Glucose-Capillary: 191 mg/dL — ABNORMAL HIGH (ref 65–99)

## 2016-03-01 LAB — CBC
HCT: 35 % — ABNORMAL LOW (ref 39.0–52.0)
Hemoglobin: 12.1 g/dL — ABNORMAL LOW (ref 13.0–17.0)
MCH: 30 pg (ref 26.0–34.0)
MCHC: 34.6 g/dL (ref 30.0–36.0)
MCV: 86.8 fL (ref 78.0–100.0)
Platelets: 166 10*3/uL (ref 150–400)
RBC: 4.03 MIL/uL — ABNORMAL LOW (ref 4.22–5.81)
RDW: 12.2 % (ref 11.5–15.5)
WBC: 9.3 10*3/uL (ref 4.0–10.5)

## 2016-03-01 LAB — PROTEIN / CREATININE RATIO, URINE
Creatinine, Urine: 81.02 mg/dL
Protein Creatinine Ratio: 0.89 mg/mg{Cre} — ABNORMAL HIGH (ref 0.00–0.15)
Total Protein, Urine: 72 mg/dL

## 2016-03-01 MED ORDER — POTASSIUM CHLORIDE CRYS ER 20 MEQ PO TBCR
40.0000 meq | EXTENDED_RELEASE_TABLET | Freq: Once | ORAL | Status: AC
  Administered 2016-03-01: 40 meq via ORAL
  Filled 2016-03-01: qty 2

## 2016-03-01 MED ORDER — SENNA 8.6 MG PO TABS
1.0000 | ORAL_TABLET | Freq: Every day | ORAL | Status: DC
  Administered 2016-03-01 – 2016-03-08 (×7): 8.6 mg via ORAL
  Filled 2016-03-01 (×7): qty 1

## 2016-03-01 MED ORDER — DIPHENHYDRAMINE HCL 25 MG PO CAPS
25.0000 mg | ORAL_CAPSULE | ORAL | Status: DC | PRN
  Administered 2016-03-01: 25 mg via ORAL
  Filled 2016-03-01: qty 1

## 2016-03-01 NOTE — Progress Notes (Signed)
Orthopedics Progress Note  Subjective: Patient reports minimal pain this PM.  He has been up with therapy.  Objective:  Vitals:   03/01/16 0742 03/01/16 1449  BP:  138/86  Pulse: (!) 106 (!) 107  Resp:  16  Temp:  (!) 101 F (38.3 C)    General: Awake and alert  Musculoskeletal: Right calf swelling down, compartments supple. No pain with PROM of the ankle. Patient is able to do active ankle pumps today Neurovascularly intact  Lab Results  Component Value Date   WBC 9.3 03/01/2016   HGB 12.1 (L) 03/01/2016   HCT 35.0 (L) 03/01/2016   MCV 86.8 03/01/2016   PLT 166 03/01/2016       Component Value Date/Time   NA 131 (L) 03/01/2016 0512   K 3.4 (L) 03/01/2016 0512   CL 98 (L) 03/01/2016 0512   CO2 27 03/01/2016 0512   GLUCOSE 122 (H) 03/01/2016 0512   BUN 10 03/01/2016 0512   CREATININE 0.58 (L) 03/01/2016 0512   CALCIUM 8.0 (L) 03/01/2016 0512   GFRNONAA >60 03/01/2016 0512   GFRAA >60 03/01/2016 0512    Lab Results  Component Value Date   INR 1.11 02/27/2016    Assessment/Plan: POD #3 s/p Procedure(s): EXTERNAL FIXATION LEG Patient awaits definitive fixation per Dr Altamese Mendes, ortho trauma specialist.  HgB A1C of 12.2 suggests poor diabetic control which I have counseled patient is a big issue.  Dr Marcelino Scot to address as well tomorrow. [facility] support.  Continue mobilization NWB on the right leg as tolerated.  DVT prophylaxis - mechanical and Lovenox  Doran Heater. Veverly Fells, MD 03/01/2016 5:54 PM

## 2016-03-01 NOTE — Progress Notes (Signed)
   Subjective: 3 Days Post-Op Procedure(s) (LRB): EXTERNAL FIXATION LEG (Right)  Currently being treated for elevated glucose and blood pressure Pain is mild and tolerable Denies any new symptoms or issues Optimistic about definitive surgery to right leg Patient reports pain as mild.  Objective:   VITALS:   Vitals:   03/01/16 0700 03/01/16 0742  BP: (!) 143/76   Pulse: (!) 103 (!) 106  Resp: 16   Temp: (!) 101.2 F (38.4 C)     Right lower extremity in external fixator in good position nv intact distally No drainage or erythema  LABS  Recent Labs  02/27/16 1600  HGB 13.8  HCT 40.2  WBC 11.7*  PLT 192     Recent Labs  02/27/16 1600 03/01/16 0512  NA 135 131*  K 4.2 3.4*  BUN 14 10  CREATININE 0.68 0.58*  GLUCOSE 356* 122*     Assessment/Plan: 3 Days Post-Op Procedure(s) (LRB): EXTERNAL FIXATION LEG (Right) Will await consult with Dr. Marcelino Scot for right lower extremity Continue treatment of hyperglycemia and hypertension Currently pain is well managed Will continue to monitor his progress    Merla Riches, MPAS, PA-C  03/01/2016, 10:14 AM

## 2016-03-01 NOTE — Progress Notes (Signed)
Inpatient Diabetes Program Recommendations  AACE/ADA: New Consensus Statement on Inpatient Glycemic Control (2015)  Target Ranges:  Prepandial:   less than 140 mg/dL      Peak postprandial:   less than 180 mg/dL (1-2 hours)      Critically ill patients:  140 - 180 mg/dL   Lab Results  Component Value Date   GLUCAP 182 (H) 03/01/2016   HGBA1C 12.2 (H) 02/28/2016    Review of Glycemic Control Results for Lawrence [name], Lawrence [name] (MRN [id number]) as of 03/01/2016 14:20  Ref. Range 02/29/2016 11:23 02/29/2016 16:26 02/29/2016 21:51 03/01/2016 07:02 03/01/2016 12:07  Glucose-Capillary Latest Ref Range: 65 - 99 mg/dL 119 (H) 162 (H) 126 (H) 124 (H) 182 (H)    Inpatient Diabetes Program Recommendations:   Spoke with pt about A1C results and explained what an A1C is, basic pathophysiology of DM Type 2, basic home care, basic diabetes diet nutrition principles, importance of checking CBGs and maintaining good CBG control to prevent long-term and short-term complications. Reviewed signs and symptoms of hyperglycemia and hypoglycemia and how to treat hypoglycemia at home. Also reviewed blood sugar goals at home.  RNs to provide ongoing basic DM education at bedside with this patient. Have ordered educational booklet, insulin starter kit, and DM videos. Have also placed RD consult for DM diet education for this patient.  Educated patient and son on insulin pen use at home. Reviewed contents of insulin teaching kit. Reviewed all steps of insulin pen including attachment of needle, 3-unit air shot, dialing up dose, giving injection, removing needle, disposal of sharps, storage of unused insulin, disposal of insulin etc. Patient able to provide successful return demonstration. Reviewed troubleshooting with insulin pen. Also reviewed Signs/Symptoms of Hypoglycemia with patient and how to treat Hypoglycemia at home. Have asked RNs caring for patient to please allow patient to give all injections here in hospital as much as  possible for practice. MD to give patient Rxs for insulin pens and insulin pen needles.   Thank you, Nani Gasser. Demitris Pokorny, RN, MSN, CDE Inpatient Glycemic Control Team Team Pager [phone] (8am-5pm) 03/01/2016 2:27 PM

## 2016-03-01 NOTE — Progress Notes (Signed)
Physical Therapy Treatment Patient Details Name: Lawrence [name] MRN: [id number] DOB: May 20, 1966 Today's Date: 03/01/2016    History of Present Illness Pt is a 50 y/o male admitted after falling from his ladder, sustaining a tibial plateau fx. Pt now with ex fix on L LE. No pertinent PMH.    PT Comments    Pt performed short gait distance and stair training in prep for d/c home.  Son present and observed session.  Informed nurse case manager that patient will need a WC at d/c for mobility to and from appointments and around his house.    Follow Up Recommendations        Equipment Recommendations  Rolling walker with 5" wheels;3in1 (PT);Wheelchair (measurements PT);Wheelchair cushion (measurements PT) (with elevating leg rests.  )    Recommendations for Other Services       Precautions / Restrictions Precautions Precautions: Fall Restrictions Weight Bearing Restrictions: Yes RLE Weight Bearing: Non weight bearing    Mobility  Bed Mobility               General bed mobility comments: Pt received in recliner on arrival.    Transfers Overall transfer level: Needs assistance Equipment used: Rolling walker (2 wheeled) Transfers: Sit to/from Stand Sit to Stand: Min guard Stand pivot transfers: Min guard       General transfer comment: Cues for hand placement and weight bearing for safety.    Ambulation/Gait Ambulation/Gait assistance: Min assist Ambulation Distance (Feet): 8 Feet (8 ft) Assistive device: Rolling walker (2 wheeled)     Gait velocity interpretation: Below normal speed for age/gender General Gait Details: Pt improved and able to maintain NWB on R with short gait distance.  Pt required cues for sequencing, turning and backing.  Shoe on during gait training.,   Stairs Stairs: Yes   Stair Management: No rails;Step to pattern;Backwards;Forwards;With walker Number of Stairs: 2 General stair comments: Cues for sequencing and RW placement.  Pt performed  backward to ascend and forward to descend.  Performed 1st trial with PTA and second trial with son for carryover.    Wheelchair Mobility    Modified Rankin (Stroke Patients Only)       Balance     Sitting balance-Leahy Scale: Fair       Standing balance-Leahy Scale: Poor                      Cognition Arousal/Alertness: Awake/alert Behavior During Therapy: WFL for tasks assessed/performed Overall Cognitive Status: Within Functional Limits for tasks assessed                      Exercises      General Comments        Pertinent Vitals/Pain Pain Assessment: 0-10 Pain Score: 5  Pain Location: R leg with dependent position.   Pain Descriptors / Indicators: Grimacing;Guarding;Moaning Pain Intervention(s): Monitored during session;Repositioned    Home Living                      Prior Function            PT Goals (current goals can now be found in the care plan section) Acute Rehab PT Goals Patient Stated Goal: return home Potential to Achieve Goals: Good Progress towards PT goals: Progressing toward goals    Frequency           PT Plan Current plan remains appropriate    Co-evaluation  End of Session Equipment Utilized During Treatment: Gait belt Activity Tolerance: Patient limited by pain;Patient limited by fatigue Patient left: in chair;with call bell/phone within reach;with family/visitor present     Time: 1226-1243 PT Time Calculation (min) (ACUTE ONLY): 17 min  Charges:  $Gait Training: 8-22 mins                    G Codes:      Cristela Blue 03/24/16, 12:51 PM  Governor Rooks, PTA pager [phone]

## 2016-03-01 NOTE — Plan of Care (Signed)
Problem: Limited Adherence to Nutrition-Related Recommendations (NB-1.6) Goal: Nutrition education Formal process to instruct or train a patient/client in a skill or to impart knowledge to help patients/clients voluntarily manage or modify food choices and eating behavior to maintain or improve health. Outcome: Completed/Met Date Met: 03/01/16  RD consulted for nutrition education regarding diabetes.   Lab Results  Component Value Date   HGBA1C 12.2 (H) 02/28/2016    RD provided "Carbohydrate Counting for People with Diabetes" handout from the Academy of [employer] and Dietetics. Discussed different food groups and their effects on blood sugar, emphasizing carbohydrate-containing foods. Provided list of carbohydrates and recommended serving sizes of common foods.  Discussed importance of controlled and consistent carbohydrate intake throughout the day. Provided examples of ways to balance meals/snacks and encouraged intake of high-fiber, whole grain complex carbohydrates. Teach back method used.  Expect fair compliance.  Current diet order is Carbohydrate Modified, patient is consuming approximately 100% of meals at this time. Labs and medications reviewed. No further nutrition interventions warranted at this time. If additional nutrition issues arise, please re-consult RD.  Arthur Holms, RD, LDN Pager #: [phone] After-Hours Pager #: [phone]

## 2016-03-01 NOTE — Progress Notes (Signed)
Occupational Therapy Treatment Patient Details Name: Lawrence [name] MRN: [id number] DOB: 01-28-1966 Today's Date: 03/01/2016    History of present illness Pt is a 50 y/o male admitted after falling from his ladder, sustaining a tibial plateau fx. Pt now with ex fix on L LE. No pertinent PMH.   OT comments  Pt. Making gains with skilled OT.  Son present and active in session including hands on training for assisting pt. With bed mobility.  Will continue to follow acutely with focus on LB ADLS next session  Follow Up Recommendations  Home health OT;Supervision/Assistance - 24 hour    Equipment Recommendations  3 in 1 bedside comode    Recommendations for Other Services      Precautions / Restrictions Precautions Precautions: Fall Restrictions Weight Bearing Restrictions: Yes RLE Weight Bearing: Non weight bearing       Mobility Bed Mobility Overal bed mobility: Needs Assistance;+2 for physical assistance Bed Mobility: Supine to Sit     Supine to sit: +2 for physical assistance     General bed mobility comments: HOB flat, no rails with son assisting for education on how pt. will perform bed mobility at home.  pt. able to come into long sitting without assistance while son guided RLE oob and pt. was able to perform the rest including scooting hips eob while son guided RLE to the floor  Transfers Overall transfer level: Needs assistance Equipment used: Rolling walker (2 wheeled) Transfers: Sit to/from [facility] Sit to Stand: Min assist;+2 safety/equipment Stand pivot transfers: Min guard       General transfer comment: Cues for hand placement and weight bearing for safety.      Balance     Sitting balance-Leahy Scale: Fair       Standing balance-Leahy Scale: Poor                     ADL Overall ADL's : Needs assistance/impaired                         Toilet Transfer: Minimal assistance;+2 for  safety/equipment;Stand-pivot;RW Toilet Transfer Details (indicate cue type and reason): simulated with stand pivot transfer from eob to [place] and Hygiene: Maximal assistance;Sit to/from stand Toileting - Clothing Manipulation Details (indicate cue type and reason): simulated during functional mobility in room     Functional mobility during ADLs: Minimal assistance;+2 for safety/equipment General ADL Comments: pt. making gains, son present and very active in session as he states he will be helping his father at home.        Vision                     Perception     Praxis      Cognition   Behavior During Therapy: WFL for tasks assessed/performed Overall Cognitive Status: Within Functional Limits for tasks assessed                       Extremity/Trunk Assessment               Exercises     Shoulder Instructions       General Comments      Pertinent Vitals/ Pain       Pain Assessment: No/denies pain Pain Score: 5  Pain Location: R leg with dependent position.   Pain Descriptors / Indicators: Grimacing;Guarding;Moaning Pain Intervention(s): Monitored during session;Repositioned  Home Living  Prior Functioning/Environment              Frequency  Min 3X/week        Progress Toward Goals  OT Goals(current goals can now be found in the care plan section)  Progress towards OT goals: Progressing toward goals  Acute Rehab OT Goals Patient Stated Goal: return home  Plan Discharge plan remains appropriate    Co-evaluation                 End of Session Equipment Utilized During Treatment: Gait belt;Rolling walker   Activity Tolerance Patient tolerated treatment well   Patient Left in chair;with call bell/phone within reach;with family/visitor present   Nurse Communication          Time: [id number] OT Time Calculation (min): 16  min  Charges: OT General Charges $OT Visit: 1 Procedure OT Treatments $Self Care/Home Management : 8-22 mins  Janice Coffin, COTA/L 03/01/2016, 1:39 PM

## 2016-03-01 NOTE — Progress Notes (Signed)
Patient refuses CPAP for the night. States he does not use one at home.

## 2016-03-01 NOTE — Progress Notes (Signed)
[facility] PROGRESS NOTE  Lawrence [name] [id number] DOB: 1965/10/08 DOA: 02/27/2016  PCP: No primary care provider on file.  Brief History/Interval Summary: 50 year old male of Hispanic origin who has a past medical history of poorly controlled diabetes who was admitted on September 22 after sustaining injury as a result of a fall from a ladder onto concrete. He was found to have communicated displaced right tibial plateau fracture. Reconstruction is planned for Tuesday, September 26. Medicine was consulted to assist with management of diabetes.  Procedures: None yet  Subjective/Interval History: Patient noted to be better spirits this morning. Pain is reasonably well controlled. He is quite happy about his blood glucose levels which been well controlled since yesterday. He was told about his high LDL levels as well as proteinuria noted on UA.   ROS: Denies any nausea or vomiting.  Objective:  Vital Signs  Vitals:   02/29/16 2144 02/29/16 2353 03/01/16 0700 03/01/16 0742  BP: (!) 145/79  (!) 143/76   Pulse: (!) 121  (!) 103 (!) 106  Resp: 16  16   Temp: (!) 101.9 F (38.8 C) 99.5 F (37.5 C) (!) 101.2 F (38.4 C)   TempSrc: Oral Oral Oral   SpO2: 98%  100% 97%    Intake/Output Summary (Last 24 hours) at 03/01/16 [id number] Last data filed at 03/01/16 0800  Gross per 24 hour  Intake              240 ml  Output                0 ml  Net              240 ml   There were no vitals filed for this visit.  General appearance: alert, cooperative, appears stated age, no distress and moderately obese Resp: clear to auscultation bilaterally Cardio: regular rate and rhythm, S1, S2 normal, no murmur, click, rub or gallop GI: soft, non-tender; bowel sounds normal; no masses,  no organomegaly Extremities: Right lower extremity is immobilized. Neurologic: No focal deficits.  Lab Results:  Data Reviewed: I have personally reviewed following labs and imaging  studies  CBC:  Recent Labs Lab 02/27/16 1600  WBC 11.7*  NEUTROABS 9.6*  HGB 13.8  HCT 40.2  MCV 86.8  PLT [id number]    Basic Metabolic Panel:  Recent Labs Lab 02/27/16 1600 03/01/16 0512  NA 135 131*  K 4.2 3.4*  CL 103 98*  CO2 22 27  GLUCOSE 356* 122*  BUN 14 10  CREATININE 0.68 0.58*  CALCIUM 9.0 8.0*    GFR: CrCl cannot be calculated (Unknown ideal weight.).  Coagulation Profile:  Recent Labs Lab 02/27/16 1914  INR 1.11    HbA1C:  Recent Labs  02/28/16 1056  HGBA1C 12.2*    CBG:  Recent Labs Lab 02/29/16 0657 02/29/16 1123 02/29/16 1626 02/29/16 2151 03/01/16 0702  GLUCAP 156* 119* 162* 126* 124*     Recent Results (from the past 240 hour(s))  Surgical pcr screen     Status: None   Collection Time: 02/27/16  5:57 PM  Result Value Ref Range Status   MRSA, PCR NEGATIVE NEGATIVE Final   Staphylococcus aureus NEGATIVE NEGATIVE Final    Comment:        The Xpert SA Assay (FDA approved for NASAL specimens in patients over 87 years of age), is one component of a comprehensive surveillance program.  Test performance has been validated by [facility] for patients greater than or  equal to 62 year old. It is not intended to diagnose infection nor to guide or monitor treatment.       Radiology Studies: No results found.   Medications:  Scheduled: . docusate sodium  100 mg Oral BID  . enoxaparin (LOVENOX) injection  40 mg Subcutaneous Q24H  . insulin aspart  0-15 Units Subcutaneous TID WC  . insulin aspart  4 Units Subcutaneous TID AC  . insulin glargine  10 Units Subcutaneous QHS  . potassium chloride  40 mEq Oral Once  . senna  1 tablet Oral Daily   Continuous: . sodium chloride 75 mL/hr (02/29/16 0458)  . lactated ringers     [id number] **OR** acetaminophen, HYDROmorphone (DILAUDID) injection, methocarbamol **OR** methocarbamol (ROBAXIN)  IV, metoCLOPramide **OR** metoCLOPramide (REGLAN) injection, ondansetron **OR**  ondansetron (ZOFRAN) IV, oxyCODONE, polyethylene glycol  Assessment/Plan:  Active Problems:   Tibial plateau fracture   Diabetes ([place])    Poorly controlled diabetes mellitus type II. HbA1c is 12.2 , implying very poor control of his diabetes. Patient was started on Lantus. He was taking metformin at home, which has been held for now.  Metformin can be resumed when he is discharged. However, he will need to go out on Lantus insulin. His LDL is 139. He will need to be initiated on statin prior to discharge. He is noted to have proteinuria on the UA. The spot protein to creatinine ratio is 890 mg/g. Patient will benefit from being placed on an ACE inhibitor at the time of discharge. He will need close outpatient monitoring of his diabetes. He will need to be seen by ophthalmology as outpatient. He will need to establish with a primary care physician.   Hyponatremia and hypokalemia Replace potassium. Monitor sodium levels. He is on IV fluids.  Fever Patient noted to be febrile overnight. He denies any cough, diarrhea, burning sensation with urination. Presumably his fever is due to his right lower extremity injury. Follow-up on blood cultures. Defer antibiotics to orthopedic service.  Tibial plateau fracture Management per orthopedics.  Thank you for this consult. We will continue to follow this patient on a daily basis while he is hospitalized.    LOS: 3 days   [facility] Pager [phone] 03/01/2016, 8:52 AM  If 7PM-7AM, please contact night-coverage at [email or web address], password [facility]

## 2016-03-02 ENCOUNTER — Encounter (HOSPITAL_COMMUNITY): Payer: Self-pay | Admitting: *Deleted

## 2016-03-02 LAB — COMPREHENSIVE METABOLIC PANEL
ALT: 24 U/L (ref 17–63)
AST: 22 U/L (ref 15–41)
Albumin: 2.9 g/dL — ABNORMAL LOW (ref 3.5–5.0)
Alkaline Phosphatase: 141 U/L — ABNORMAL HIGH (ref 38–126)
Anion gap: 7 (ref 5–15)
BUN: 10 mg/dL (ref 6–20)
CO2: 27 mmol/L (ref 22–32)
Calcium: 8.6 mg/dL — ABNORMAL LOW (ref 8.9–10.3)
Chloride: 99 mmol/L — ABNORMAL LOW (ref 101–111)
Creatinine, Ser: 0.53 mg/dL — ABNORMAL LOW (ref 0.61–1.24)
GFR calc Af Amer: >60 mL/min (ref >60)
GFR calc non Af Amer: >60 mL/min (ref >60)
Glucose, Bld: 187 mg/dL — ABNORMAL HIGH (ref 65–99)
Potassium: 3.8 mmol/L (ref 3.5–5.1)
Sodium: 133 mmol/L — ABNORMAL LOW (ref 135–145)
Total Bilirubin: 0.8 mg/dL (ref 0.3–1.2)
Total Protein: 5.7 g/dL — ABNORMAL LOW (ref 6.5–8.1)

## 2016-03-02 LAB — CBC WITH DIFFERENTIAL/PLATELET
Basophils Absolute: 0 10*3/uL (ref 0.0–0.1)
Basophils Relative: 0 %
Eosinophils Absolute: 0.4 10*3/uL (ref 0.0–0.7)
Eosinophils Relative: 5 %
HCT: 31.2 % — ABNORMAL LOW (ref 39.0–52.0)
Hemoglobin: 10.8 g/dL — ABNORMAL LOW (ref 13.0–17.0)
Lymphocytes Relative: 20 %
Lymphs Abs: 1.6 10*3/uL (ref 0.7–4.0)
MCH: 30.3 pg (ref 26.0–34.0)
MCHC: 34.6 g/dL (ref 30.0–36.0)
MCV: 87.6 fL (ref 78.0–100.0)
Monocytes Absolute: 0.9 10*3/uL (ref 0.1–1.0)
Monocytes Relative: 11 %
Neutro Abs: 5.1 10*3/uL (ref 1.7–7.7)
Neutrophils Relative %: 64 %
Platelets: 181 10*3/uL (ref 150–400)
RBC: 3.56 MIL/uL — ABNORMAL LOW (ref 4.22–5.81)
RDW: 12.1 % (ref 11.5–15.5)
WBC: 8 10*3/uL (ref 4.0–10.5)

## 2016-03-02 LAB — URINALYSIS, ROUTINE W REFLEX MICROSCOPIC
Bilirubin Urine: NEGATIVE
Glucose, UA: 500 mg/dL — AB
Hgb urine dipstick: NEGATIVE
Ketones, ur: 15 mg/dL — AB
Leukocytes, UA: NEGATIVE
Nitrite: NEGATIVE
Protein, ur: 100 mg/dL — AB
Specific Gravity, Urine: 1.017 (ref 1.005–1.030)
pH: 6 (ref 5.0–8.0)

## 2016-03-02 LAB — GLUCOSE, CAPILLARY
Glucose-Capillary: 188 mg/dL — ABNORMAL HIGH (ref 65–99)
Glucose-Capillary: 188 mg/dL — ABNORMAL HIGH (ref 65–99)
Glucose-Capillary: 234 mg/dL — ABNORMAL HIGH (ref 65–99)
Glucose-Capillary: 145 mg/dL — ABNORMAL HIGH (ref 65–99)

## 2016-03-02 LAB — URINE MICROSCOPIC-ADD ON
Bacteria, UA: NONE SEEN
RBC / HPF: NONE SEEN RBC/hpf (ref 0–5)
WBC, UA: NONE SEEN WBC/hpf (ref 0–5)

## 2016-03-02 LAB — MAGNESIUM: Magnesium: 1.9 mg/dL (ref 1.7–2.4)

## 2016-03-02 LAB — TSH: TSH: 1.453 u[IU]/mL (ref 0.350–4.500)

## 2016-03-02 LAB — PHOSPHORUS: Phosphorus: 3.8 mg/dL (ref 2.5–4.6)

## 2016-03-02 LAB — PREALBUMIN: Prealbumin: 9 mg/dL — ABNORMAL LOW (ref 18–38)

## 2016-03-02 MED ORDER — ACETAMINOPHEN 500 MG PO TABS
1000.0000 mg | ORAL_TABLET | Freq: Four times a day (QID) | ORAL | Status: DC
  Administered 2016-03-02 – 2016-03-08 (×22): 1000 mg via ORAL
  Filled 2016-03-02 (×23): qty 2

## 2016-03-02 MED ORDER — POLYETHYLENE GLYCOL 3350 17 G PO PACK
17.0000 g | PACK | Freq: Two times a day (BID) | ORAL | Status: DC
  Administered 2016-03-02 – 2016-03-08 (×9): 17 g via ORAL
  Filled 2016-03-02 (×10): qty 1

## 2016-03-02 MED ORDER — INSULIN GLARGINE 100 UNIT/ML ~~LOC~~ SOLN
14.0000 [IU] | Freq: Every day | SUBCUTANEOUS | Status: DC
  Administered 2016-03-02 – 2016-03-04 (×3): 14 [IU] via SUBCUTANEOUS
  Filled 2016-03-02 (×3): qty 0.14

## 2016-03-02 NOTE — Progress Notes (Signed)
Inpatient Diabetes Program Recommendations  AACE/ADA: New Consensus Statement on Inpatient Glycemic Control (2015)  Target Ranges:  Prepandial:   less than 140 mg/dL      Peak postprandial:   less than 180 mg/dL (1-2 hours)      Critically ill patients:  140 - 180 mg/dL   Lab Results  Component Value Date   GLUCAP 188 (H) 03/02/2016   HGBA1C 12.2 (H) 02/28/2016    Review of Glycemic Control:  Results for Lawrence [name], Lawrence [name] (MRN [id number]) as of 03/02/2016 11:14  Ref. Range 03/01/2016 12:07 03/01/2016 16:33 03/01/2016 23:20 03/02/2016 06:29 03/02/2016 11:06  Glucose-Capillary Latest Ref Range: 65 - 99 mg/dL 182 (H) 175 (H) 191 (H) 188 (H) 188 (H)    Diabetes history: Type 2 diabetes Outpatient Diabetes medications: Metformin 1000 mg bid Current orders for Inpatient glycemic control:  Lantus 10 units q HS, Novolog 4 units tid with meals, Novolog moderate tid with meals  Inpatient Diabetes Program Recommendations:    Please consider increasing Lantus to 15 units q HS.   Thanks, Adah Perl, RN, BC-ADM Inpatient Diabetes Coordinator Pager [phone] (8a-5p)

## 2016-03-02 NOTE — Progress Notes (Signed)
Orthopedics Progress Note  Subjective: Patient stable overnight no new complaints  Objective:  Vitals:   03/01/16 2040 03/02/16 0557  BP: (!) 145/75 130/83  Pulse: (!) 110 96  Resp: 16 16  Temp: (!) 101 F (38.3 C) 98.8 F (37.1 C)    General: Awake and alert  Musculoskeletal: right leg dressing loosened proximally as it was cutting off circulation.  Pin sites dressed and compartments supple. Neurovascularly intact  Lab Results  Component Value Date   WBC 8.0 03/02/2016   HGB 10.8 (L) 03/02/2016   HCT 31.2 (L) 03/02/2016   MCV 87.6 03/02/2016   PLT 181 03/02/2016       Component Value Date/Time   NA 133 (L) 03/02/2016 0400   K 3.8 03/02/2016 0400   CL 99 (L) 03/02/2016 0400   CO2 27 03/02/2016 0400   GLUCOSE 187 (H) 03/02/2016 0400   BUN 10 03/02/2016 0400   CREATININE 0.53 (L) 03/02/2016 0400   CALCIUM 8.6 (L) 03/02/2016 0400   GFRNONAA >60 03/02/2016 0400   GFRAA >60 03/02/2016 0400    Lab Results  Component Value Date   INR 1.11 02/27/2016   CXR: Bibasilar atelectasis  Assessment/Plan: POD # 4s/p Procedure(s): EXTERNAL FIXATION LEG Await definitive plan for fixation per Dr [place]e [place]. No evidence for pneumonia - atelectasis likely responsible for elevated temps Sugars better controlled -180's this AM  Doran Heater. Veverly Fells, MD 03/02/2016 7:20 AM

## 2016-03-02 NOTE — Progress Notes (Signed)
Orthopedic Tech Progress Note Patient Details:  Lawrence [name] September 23, 1965 [id number]  Ortho Devices Type of Ortho Device: Ace wrap, Doran Durand splint Ortho Device/Splint Location: rle Ortho Device/Splint Interventions: Application   [name], Lawrence 03/02/2016, 1:27 PM

## 2016-03-02 NOTE — Progress Notes (Addendum)
[facility] PROGRESS NOTE  TALIK PERITO [id number] DOB: Sep 30, 1965 DOA: 02/27/2016  PCP: No primary care provider on file.  Brief History/Interval Summary: 50 year old male of Hispanic origin who has a past medical history of poorly controlled diabetes who was admitted on September 22 after sustaining injury as a result of a fall from a ladder onto concrete. He was found to have communicated displaced right tibial plateau fracture. Reconstruction is planned for Tuesday, September 26. Medicine was consulted to assist with management of diabetes.  Procedures: None yet  Subjective/Interval History: Patient feels well this morning. Denies any pain. His family is at the bedside. He has been self administering insulin and seems optimistic about his condition.   ROS: Denies any nausea or vomiting.  Objective:  Vital Signs  Vitals:   03/01/16 0742 03/01/16 1449 03/01/16 2040 03/02/16 0557  BP:  138/86 (!) 145/75 130/83  Pulse: (!) 106 (!) 107 (!) 110 96  Resp:  16 16 16   Temp:  (!) 101 F (38.3 C) (!) 101 F (38.3 C) 98.8 F (37.1 C)  TempSrc:  Oral Oral Oral  SpO2: 97% 98% 98% 100%    Intake/Output Summary (Last 24 hours) at 03/02/16 0947 Last data filed at 03/02/16 0700  Gross per 24 hour  Intake              831 ml  Output             1300 ml  Net             -469 ml   There were no vitals filed for this visit.  General appearance: alert, cooperative, appears stated age, no distress and moderately obese Resp: clear to auscultation bilaterally Cardio: regular rate and rhythm, S1, S2 normal, no murmur, click, rub or gallop GI: soft, non-tender; bowel sounds normal; no masses,  no organomegaly Extremities: Right lower extremity is immobilized.   Lab Results:  Data Reviewed: I have personally reviewed following labs and imaging studies  CBC:  Recent Labs Lab 02/27/16 1600 03/01/16 0938 03/02/16 0400  WBC 11.7* 9.3 8.0  NEUTROABS 9.6*  --  5.1  HGB 13.8  12.1* 10.8*  HCT 40.2 35.0* 31.2*  MCV 86.8 86.8 87.6  PLT 192 166 0000000    Basic Metabolic Panel:  Recent Labs Lab 02/27/16 1600 03/01/16 0512 03/02/16 0400  NA 135 131* 133*  K 4.2 3.4* 3.8  CL 103 98* 99*  CO2 22 27 27   GLUCOSE 356* 122* 187*  BUN 14 10 10   CREATININE 0.68 0.58* 0.53*  CALCIUM 9.0 8.0* 8.6*  MG  --   --  1.9  PHOS  --   --  3.8    GFR: CrCl cannot be calculated (Unknown ideal weight.).  Coagulation Profile:  Recent Labs Lab 02/27/16 1914  INR 1.11    HbA1C:  Recent Labs  02/28/16 1056  HGBA1C 12.2*    CBG:  Recent Labs Lab 03/01/16 0702 03/01/16 1207 03/01/16 1633 03/01/16 2320 03/02/16 0629  GLUCAP 124* 182* 175* 191* 188*     Recent Results (from the past 240 hour(s))  Surgical pcr screen     Status: None   Collection Time: 02/27/16  5:57 PM  Result Value Ref Range Status   MRSA, PCR NEGATIVE NEGATIVE Final   Staphylococcus aureus NEGATIVE NEGATIVE Final    Comment:        The Xpert SA Assay (FDA approved for NASAL specimens in patients over 27 years of age), is  one component of a comprehensive surveillance program.  Test performance has been validated by [facility] for patients greater than or equal to 79 year old. It is not intended to diagnose infection nor to guide or monitor treatment.       Radiology Studies: Dg Chest Port 1 View  Result Date: 03/02/2016 CLINICAL DATA:  Acute onset of postoperative fever. Initial encounter. EXAM: PORTABLE CHEST 1 VIEW COMPARISON:  Chest radiograph performed 02/27/2016 FINDINGS: There is mild elevation of the right hemidiaphragm. Mild bibasilar atelectasis is noted. No pleural effusion or pneumothorax is seen. The cardiomediastinal silhouette is borderline enlarged. No acute osseous abnormalities are identified. IMPRESSION: Mild elevation of the right hemidiaphragm. Mild bibasilar atelectasis noted. Borderline cardiomegaly. Electronically Signed   By: Garald Balding M.D.   On:  03/02/2016 02:53     Medications:  Scheduled: . docusate sodium  100 mg Oral BID  . enoxaparin (LOVENOX) injection  40 mg Subcutaneous Q24H  . insulin aspart  0-15 Units Subcutaneous TID WC  . insulin aspart  4 Units Subcutaneous TID AC  . insulin glargine  10 Units Subcutaneous QHS  . senna  1 tablet Oral Daily   Continuous: . sodium chloride 75 mL/hr at 03/01/16 2307  . lactated ringers     [id number] **OR** acetaminophen, diphenhydrAMINE, HYDROmorphone (DILAUDID) injection, methocarbamol **OR** methocarbamol (ROBAXIN)  IV, metoCLOPramide **OR** metoCLOPramide (REGLAN) injection, ondansetron **OR** ondansetron (ZOFRAN) IV, oxyCODONE, polyethylene glycol  Assessment/Plan:  Active Problems:   Tibial plateau fracture   Diabetes ([place])    Poorly controlled diabetes mellitus type II. HbA1c is 12.2 , implying very poor control of his diabetes. Patient was started on Lantus. CBGs are reasonably well controlled. They could be better controlled. However, he will not be too aggressive as patient will need to undergo surgery soon. But still increase dose of Lantus by 4 units today. He was taking metformin at home, which has been held for now.  Metformin can be resumed when he is discharged. However, he will need to go out on Lantus insulin. His LDL is 139. He will need to be initiated on statin prior to discharge. He is noted to have proteinuria on the UA. The spot protein to creatinine ratio is 890 mg/g. Patient will benefit from being placed on an ACE inhibitor at the time of discharge. He will need close outpatient monitoring of his diabetes. He will need to be seen by ophthalmology as outpatient. He will need to establish with a primary care physician. His daughter will initiate a search for PCP.  Hyponatremia and hypokalemia Potassium is improved. Sodium is better as well. He is on IV fluids.  Fever Patient continues to have fever. No obvious source of infection identified. Blood  cultures are pending. Chest x-ray does not show any infiltrates. Atelectasis was noted. UA did not suggest infection. WBC is normal. Discussed with orthopedic service. Apparently, the right leg does not show any evidence for infection. Continue to monitor for now. He denies any cough, diarrhea, burning sensation with urination. Follow-up on blood cultures. Fever could be due to right lower extremity injury and inflammation.  Constipation. Secondary to narcotics. Increase laxatives. TSH was normal.  Tibial plateau fracture Management per orthopedics.  Thank you for this consult. We will continue to follow this patient on a daily basis while he is hospitalized.    LOS: 4 days   [facility] Pager [phone] 03/02/2016, 9:47 AM  If 7PM-7AM, please contact night-coverage at [email or web address], password [facility]

## 2016-03-02 NOTE — Progress Notes (Signed)
Physical Therapy Treatment Patient Details Name: Lawrence [name] MRN: [id number] DOB: 17-Jun-1965 Today's Date: 03/02/2016    History of Present Illness Pt is a 50 y/o male admitted after falling from his ladder, sustaining a tibial plateau fx. Pt now with ex fix on L LE. No pertinent PMH.    PT Comments    Pt performed increased gait distance, but endurance remains an issue and patient requires increased rest periods.  PTA instructed patient through LE therapeutic exercise in LLE to maintain strength on unaffected extremity.    Follow Up Recommendations  Home health PT;Supervision for mobility/OOB     Equipment Recommendations  Rolling walker with 5" wheels;3in1 (PT);Wheelchair (measurements PT);Wheelchair cushion (measurements PT) (with elevating legs rests.  )    Recommendations for Other Services       Precautions / Restrictions Precautions Precautions: Fall Restrictions Weight Bearing Restrictions: Yes RLE Weight Bearing: Non weight bearing    Mobility  Bed Mobility Overal bed mobility: Needs Assistance Bed Mobility: Supine to Sit     Supine to sit: Min assist     General bed mobility comments: HOB flat with use of rails and assist with RLE to edge of bed.    Transfers Overall transfer level: Needs assistance Equipment used: Rolling walker (2 wheeled) Transfers: Sit to/from Stand Sit to Stand: Min guard Stand pivot transfers: Min guard       General transfer comment: Cues for hand placement and weight bearing for safety.    Ambulation/Gait Ambulation/Gait assistance: Min assist Ambulation Distance (Feet): 64 Feet   Gait Pattern/deviations: Step-to pattern;Antalgic;Shuffle;Trunk flexed   Gait velocity interpretation: Below normal speed for age/gender General Gait Details: Pt improved and able to maintain NWB on R with short gait distance.  Pt required cues for sequencing, turning and backing.  Pt required xx5 rest breaks during session in standing due to  fatigue.     Stairs            Wheelchair Mobility    Modified Rankin (Stroke Patients Only)       Balance Overall balance assessment: Needs assistance   Sitting balance-Leahy Scale: Fair       Standing balance-Leahy Scale: Poor                      Cognition Arousal/Alertness: Awake/alert Behavior During Therapy: WFL for tasks assessed/performed Overall Cognitive Status: Within Functional Limits for tasks assessed                      Exercises Total Joint Exercises Ankle Circles/Pumps: AROM;10 reps;Supine;Left Quad Sets: AROM;Left;10 reps;Supine Short Arc Quad: AROM;Left;10 reps;Supine Heel Slides: AROM;Left;10 reps;Supine Hip ABduction/ADduction: AROM;Left;10 reps;Supine Straight Leg Raises: AROM;Left;10 reps;Supine    General Comments        Pertinent Vitals/Pain Pain Assessment: 0-10 Pain Score: 6  Pain Location: B rib cage and R leg in dependent position.   Pain Descriptors / Indicators: Grimacing;Guarding;Moaning Pain Intervention(s): Monitored during session;Repositioned;Ice applied    Home Living                      Prior Function            PT Goals (current goals can now be found in the care plan section) Acute Rehab PT Goals Patient Stated Goal: return home Potential to Achieve Goals: Good Progress towards PT goals: Progressing toward goals    Frequency    Min 5X/week      PT Plan  Current plan remains appropriate    Co-evaluation             End of Session Equipment Utilized During Treatment: Gait belt Activity Tolerance: Patient limited by pain;Patient limited by fatigue Patient left: in chair;with call bell/phone within reach;with family/visitor present     Time: [id number] PT Time Calculation (min) (ACUTE ONLY): 23 min  Charges:  $Gait Training: 8-22 mins $Therapeutic Exercise: 8-22 mins                    G Codes:      Cristela Blue Mar 20, 2016, 3:02 PM  Governor Rooks, PTA pager  [phone]

## 2016-03-02 NOTE — Progress Notes (Signed)
Occupational Therapy Treatment Patient Details Name: Lawrence [name] MRN: [id number] DOB: 02-26-1966 Today's Date: 03/02/2016    History of present illness Pt is a 50 y/o male admitted after falling from his ladder, sustaining a tibial plateau fx. Pt now with ex fix on L LE. No pertinent PMH.   OT comments  Pt continues to progress with OT. Began education on use of AE for LB ADLs and patient and family friend very receptive and require further reinforcement with this. Pt able to don L sock with sock aid with mod assist for reinforcement of sequence. Completed oral care grooming tasks while seated at EOB with min guard assist to maintain balance with single upper extremity support. Surgery planned for Thursday (9/28) for ORIF R tibial plateau and removal of ex fix per PA note 03/02/16. OT will continue to follow acutely. Current D/C plan remains appropriate at this time and will be updated as necessary.    Follow Up Recommendations  Home health OT;Supervision/Assistance - 24 hour    Equipment Recommendations  3 in 1 bedside comode    Recommendations for Other Services      Precautions / Restrictions Precautions Precautions: Fall Precaution Comments: Pt with external fixator RLE Restrictions Weight Bearing Restrictions: Yes RLE Weight Bearing: Non weight bearing       Mobility Bed Mobility Overal bed mobility: Needs Assistance Bed Mobility: Supine to Sit     Supine to sit: Min assist     General bed mobility comments: HOB elevated with use of rails. OT assist with R LE movement.  Transfers Overall transfer level: Needs assistance Equipment used: Rolling walker (2 wheeled) Transfers: Sit to/from Stand Sit to Stand: Min guard Stand pivot transfers: Min guard       General transfer comment: Cues for hand placement and weight bearing for safety.      Balance Overall balance assessment: Needs assistance Sitting-balance support: Feet supported;Single extremity  supported Sitting balance-Leahy Scale: Fair   Postural control: Posterior lean   Standing balance-Leahy Scale: Poor                     ADL Overall ADL's : Needs assistance/impaired     Grooming: Oral care;Sitting;Min guard;Set up Grooming Details (indicate cue type and reason): Required min guard assist for maintaining balance sitting EOB.             Lower Body Dressing: Moderate assistance;Bed level (With AE) Lower Body Dressing Details (indicate cue type and reason): Pt donned L sock from bed level using sock aid with mod assist.               General ADL Comments: Pt progressing with OT goals, in significant pain this date. Highly interested in AE for LB ADLs.      Vision                     Perception     Praxis      Cognition   Behavior During Therapy: [facility] for tasks assessed/performed Overall Cognitive Status: Within Functional Limits for tasks assessed                       Extremity/Trunk Assessment               Exercises Total Joint Exercises Ankle Circles/Pumps: AROM;10 reps;Supine;Left Quad Sets: AROM;Left;10 reps;Supine Short Arc Quad: AROM;Left;10 reps;Supine Heel Slides: AROM;Left;10 reps;Supine Hip ABduction/ADduction: AROM;Left;10 reps;Supine Straight Leg Raises: AROM;Left;10 reps;Supine  Shoulder Instructions       General Comments      Pertinent Vitals/ Pain       Pain Assessment: 0-10 Pain Score: 9  Pain Location: R LE Pain Descriptors / Indicators: Constant;Aching;Discomfort;Moaning;Grimacing Pain Intervention(s): Limited activity within patient's tolerance;Monitored during session;Repositioned;Ice applied  Home Living                                          Prior Functioning/Environment              Frequency  Min 3X/week        Progress Toward Goals  OT Goals(current goals can now be found in the care plan section)  Progress towards OT goals: Progressing  toward goals  Acute Rehab OT Goals Patient Stated Goal: return home OT Goal Formulation: With patient/family Time For Goal Achievement: 03/13/16 Potential to Achieve Goals: Good ADL Goals Pt Will Perform Grooming: with supervision;standing Pt Will Perform Upper Body Bathing: with modified independence;sitting Pt Will Perform Lower Body Bathing: with supervision;sit to/from stand Pt Will Transfer to Toilet: with supervision;ambulating;bedside commode Pt Will Perform Toileting - Clothing Manipulation and hygiene: with modified independence;sit to/from stand Pt Will Perform Tub/Shower Transfer: Tub transfer;with min assist;with caregiver independent in assisting;3 in 1;ambulating;rolling walker  Plan Discharge plan remains appropriate    Co-evaluation                 End of Session Equipment Utilized During Treatment:  (AE for ADLs)   Activity Tolerance Patient tolerated treatment well   Patient Left in bed;with call bell/phone within reach;with family/visitor present (with 2L O2)   Nurse Communication  (Check IV tape per pt request.)        Time: 1515-1550 OT Time Calculation (min): 35 min  Charges: OT General Charges $OT Visit: 1 Procedure OT Treatments $Self Care/Home Management : 23-37 mins  Norman Herrlich, OTR/L [phone] 03/02/2016, 4:27 PM

## 2016-03-02 NOTE — Consult Note (Signed)
Orthopaedic Trauma Service (OTS) Consult   Reason for Consult: Right Bicondylar tibial plateau fracture Referring Physician: Gilberto Better, MD   HPI: Lawrence [name] is an 50 y.o. hispanic male who sustained a fall on 02/27/2016. Patient fell approximately 7 feet off of a ladder. He was working above the garage when the gradual open not him off his ladder. He landed on concrete below. Patient requested to brought to [facility]. He was seen and evaluated in the emergency department where he was found to have an isolated orthopedic injury with a complex bicondylar right tibial plateau fracture. Patient was seen and evaluated by Dr. Alma Friendly on the day of presentation. given severe impaction and shortening of his right leg along with instability patient was taken urgently to the OR for application of a spanning external fixator. Patient was placed into an Ace wrap to help with swelling control as well. On admission patient was noted to have a severely elevated blood sugar levels above 300. Follow-up hemoglobin A1c was noted to be 12.2%. Internal medicine was consulted to assist with diabetes management.  Due to the complexity of the injury the orthopedic trauma service was consult for definitive management. Patient is seen and evaluated today. He is in good spirits no specific complaints other than right leg pain. Denies any numbness or tingling in his right lower extremity. Denies any additional injuries elsewhere. No chest pain or shortness of breath, no nausea or vomiting no abdominal pain. Patient was noted to be febrile yesterday with a Tmax of 10 730F but is currently afebrile with a temperature of 98.30F  Patient reports diabetes for 20+ years. States he was on metformin prior to admission has never instructed to be on insulin. Says he had a PCP [facility] but this physician has moved on and now currently without a primary care physician. 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 Diabetes mellitus without complication [facility])     Past Surgical History:  Procedure Laterality Date  . EXTERNAL FIXATION LEG Right 02/27/2016   Procedure: EXTERNAL FIXATION LEG;  Surgeon: Netta Cedars, MD;  Location: [facility];  Service: Orthopedics;  Laterality: Right;  reduction and external fixation right tibial plateau fracture    History reviewed. No pertinent family history.  Social History:  reports that he has never smoked. He has never used smokeless tobacco. He reports that he drinks alcohol. He reports that he does not use drugs.  Allergies: No Known Allergies  Medications:  I have reviewed the patient's current medications. Prior to Admission:  Prescriptions Prior to Admission  Medication Sig Dispense Refill Last Dose  . metFORMIN (GLUCOPHAGE) 1000 MG tablet Take 1,000 mg by mouth 2 (two) times daily.  0 02/26/2016 at pm   Scheduled: . docusate sodium  100 mg Oral BID  . enoxaparin (LOVENOX) injection  40 mg Subcutaneous Q24H  . insulin aspart  0-15 Units Subcutaneous TID WC  . insulin aspart  4 Units Subcutaneous TID AC  . insulin glargine  10 Units Subcutaneous QHS  . senna  1 tablet Oral Daily   XBD:[id number] **OR** acetaminophen, diphenhydrAMINE, HYDROmorphone (DILAUDID)  injection, methocarbamol **OR** methocarbamol (ROBAXIN)  IV, metoCLOPramide **OR** metoCLOPramide (REGLAN) injection, ondansetron **OR** ondansetron (ZOFRAN) IV, oxyCODONE, polyethylene glycol  Results for orders placed or performed during the hospital encounter of 02/27/16 (from the past 48 hour(s))  Glucose, capillary     Status: Abnormal    Collection Time: 02/29/16 11:23 AM  Result Value Ref Range   Glucose-Capillary 119 (H) 65 - 99 mg/dL   Comment 1 Notify RN   Glucose, capillary     Status: Abnormal   Collection Time: 02/29/16  4:26 PM  Result Value Ref Range   Glucose-Capillary 162 (H) 65 - 99 mg/dL   Comment 1 Notify RN   Urinalysis, Routine w reflex microscopic (not at [facility])     Status: Abnormal   Collection Time: 02/29/16  6:32 PM  Result Value Ref Range   Color, Urine YELLOW YELLOW   APPearance CLEAR CLEAR   Specific Gravity, Urine 1.025 1.005 - 1.030   pH 5.5 5.0 - 8.0   Glucose, UA >1000 (A) NEGATIVE mg/dL   Hgb urine dipstick TRACE (A) NEGATIVE   Bilirubin Urine NEGATIVE NEGATIVE   Ketones, ur 15 (A) NEGATIVE mg/dL   Protein, ur 100 (A) NEGATIVE mg/dL   Nitrite NEGATIVE NEGATIVE   Leukocytes, UA NEGATIVE NEGATIVE  Urine microscopic-add on     Status: Abnormal   Collection Time: 02/29/16  6:32 PM  Result Value Ref Range   Squamous Epithelial / LPF 0-5 (A) NONE SEEN   WBC, UA 0-5 0 - 5 WBC/hpf   RBC / HPF 0-5 0 - 5 RBC/hpf   Bacteria, UA NONE SEEN NONE SEEN  Glucose, capillary     Status: Abnormal   Collection Time: 02/29/16  9:51 PM  Result Value Ref Range   Glucose-Capillary 126 (H) 65 - 99 mg/dL  Urinalysis, Routine w reflex microscopic (not at [facility])     Status: Abnormal   Collection Time: 03/01/16  1:15 AM  Result Value Ref Range   Color, Urine YELLOW YELLOW   APPearance CLEAR CLEAR   Specific Gravity, Urine 1.017 1.005 - 1.030   pH 6.0 5.0 - 8.0   Glucose, UA 500 (A) NEGATIVE mg/dL   Hgb urine dipstick NEGATIVE NEGATIVE   Bilirubin Urine NEGATIVE NEGATIVE   Ketones, ur 15 (A) NEGATIVE mg/dL   Protein, ur 100 (A) NEGATIVE mg/dL   Nitrite NEGATIVE NEGATIVE   Leukocytes, UA NEGATIVE NEGATIVE  Urine microscopic-add on     Status: Abnormal   Collection Time: 03/01/16  1:15 AM  Result Value Ref Range   Squamous Epithelial / LPF 0-5 (A) NONE SEEN   WBC, UA NONE SEEN 0 - 5 WBC/hpf   RBC /  HPF NONE SEEN 0 - 5 RBC/hpf   Bacteria, UA NONE SEEN NONE SEEN  Basic metabolic panel     Status: Abnormal   Collection Time: 03/01/16  5:12 AM  Result Value Ref Range   Sodium 131 (L) 135 - 145 mmol/L   Potassium 3.4 (L) 3.5 - 5.1 mmol/L   Chloride 98 (L) 101 - 111 mmol/L   CO2 27 22 - 32 mmol/L   Glucose, Bld 122 (H) 65 - 99 mg/dL   BUN 10 6 - 20 mg/dL   Creatinine, Ser 0.58 (L) 0.61 - 1.24 mg/dL   Calcium 8.0 (L) 8.9 - 10.3 mg/dL   GFR calc non Af Amer >60 >60 mL/min   GFR calc Af Amer >60 >60 mL/min    Comment: (NOTE) The eGFR has been calculated using  the CKD EPI equation. This calculation has not been validated in all clinical situations. eGFR's persistently <60 mL/min signify possible Chronic Kidney Disease.    Anion gap 6 5 - 15  Lipid panel     Status: Abnormal   Collection Time: 03/01/16  5:12 AM  Result Value Ref Range   Cholesterol 193 0 - 200 mg/dL   Triglycerides 107 <150 mg/dL   HDL 33 (L) >40 mg/dL   Total CHOL/HDL Ratio 5.8 RATIO   VLDL 21 0 - 40 mg/dL   LDL Cholesterol 139 (H) 0 - 99 mg/dL    Comment:        Total Cholesterol/HDL:CHD Risk Coronary Heart Disease Risk Table                     Men   Women  1/2 Average Risk   3.4   3.3  Average Risk       5.0   4.4  2 X Average Risk   9.6   7.1  3 X Average Risk  23.4   11.0        Use the calculated Patient Ratio above and the CHD Risk Table to determine the patient's CHD Risk.        ATP III CLASSIFICATION (LDL):  <100     mg/dL   Optimal  100-129  mg/dL   Near or Above                    Optimal  130-159  mg/dL   Borderline  160-189  mg/dL   High  >190     mg/dL   Very High   Glucose, capillary     Status: Abnormal   Collection Time: 03/01/16  7:02 AM  Result Value Ref Range   Glucose-Capillary 124 (H) 65 - 99 mg/dL  CBC     Status: Abnormal   Collection Time: 03/01/16  9:38 AM  Result Value Ref Range   WBC 9.3 4.0 - 10.5 K/uL   RBC 4.03 (L) 4.22 - 5.81 MIL/uL   Hemoglobin 12.1 (L) 13.0 -  17.0 g/dL   HCT 35.0 (L) 39.0 - 52.0 %   MCV 86.8 78.0 - 100.0 fL   MCH 30.0 26.0 - 34.0 pg   MCHC 34.6 30.0 - 36.0 g/dL   RDW 12.2 11.5 - 15.5 %   Platelets 166 150 - 400 K/uL  Protein / creatinine ratio, urine     Status: Abnormal   Collection Time: 03/01/16 10:05 AM  Result Value Ref Range   Creatinine, Urine 81.02 mg/dL   Total Protein, Urine 72 mg/dL    Comment: NO NORMAL RANGE ESTABLISHED FOR THIS TEST   Protein Creatinine Ratio 0.89 (H) 0.00 - 0.15 mg/mg[Cre]  Glucose, capillary     Status: Abnormal   Collection Time: 03/01/16 12:07 PM  Result Value Ref Range   Glucose-Capillary 182 (H) 65 - 99 mg/dL  Glucose, capillary     Status: Abnormal   Collection Time: 03/01/16  4:33 PM  Result Value Ref Range   Glucose-Capillary 175 (H) 65 - 99 mg/dL  Glucose, capillary     Status: Abnormal   Collection Time: 03/01/16 11:20 PM  Result Value Ref Range   Glucose-Capillary 191 (H) 65 - 99 mg/dL  TSH     Status: None   Collection Time: 03/02/16  4:00 AM  Result Value Ref Range   TSH 1.453 0.350 - 4.500 uIU/mL  Magnesium  Status: None   Collection Time: 03/02/16  4:00 AM  Result Value Ref Range   Magnesium 1.9 1.7 - 2.4 mg/dL  Phosphorus     Status: None   Collection Time: 03/02/16  4:00 AM  Result Value Ref Range   Phosphorus 3.8 2.5 - 4.6 mg/dL  Prealbumin     Status: Abnormal   Collection Time: 03/02/16  4:00 AM  Result Value Ref Range   Prealbumin 9.0 (L) 18 - 38 mg/dL  Comprehensive metabolic panel     Status: Abnormal   Collection Time: 03/02/16  4:00 AM  Result Value Ref Range   Sodium 133 (L) 135 - 145 mmol/L   Potassium 3.8 3.5 - 5.1 mmol/L   Chloride 99 (L) 101 - 111 mmol/L   CO2 27 22 - 32 mmol/L   Glucose, Bld 187 (H) 65 - 99 mg/dL   BUN 10 6 - 20 mg/dL   Creatinine, Ser 0.53 (L) 0.61 - 1.24 mg/dL   Calcium 8.6 (L) 8.9 - 10.3 mg/dL   Total Protein 5.7 (L) 6.5 - 8.1 g/dL   Albumin 2.9 (L) 3.5 - 5.0 g/dL   AST 22 15 - 41 U/L   ALT 24 17 - 63 U/L    Alkaline Phosphatase 141 (H) 38 - 126 U/L   Total Bilirubin 0.8 0.3 - 1.2 mg/dL   GFR calc non Af Amer >60 >60 mL/min   GFR calc Af Amer >60 >60 mL/min    Comment: (NOTE) The eGFR has been calculated using the CKD EPI equation. This calculation has not been validated in all clinical situations. eGFR's persistently <60 mL/min signify possible Chronic Kidney Disease.    Anion gap 7 5 - 15  CBC with Differential/Platelet     Status: Abnormal   Collection Time: 03/02/16  4:00 AM  Result Value Ref Range   WBC 8.0 4.0 - 10.5 K/uL   RBC 3.56 (L) 4.22 - 5.81 MIL/uL   Hemoglobin 10.8 (L) 13.0 - 17.0 g/dL   HCT 31.2 (L) 39.0 - 52.0 %   MCV 87.6 78.0 - 100.0 fL   MCH 30.3 26.0 - 34.0 pg   MCHC 34.6 30.0 - 36.0 g/dL   RDW 12.1 11.5 - 15.5 %   Platelets 181 150 - 400 K/uL   Neutrophils Relative % 64 %   Neutro Abs 5.1 1.7 - 7.7 K/uL   Lymphocytes Relative 20 %   Lymphs Abs 1.6 0.7 - 4.0 K/uL   Monocytes Relative 11 %   Monocytes Absolute 0.9 0.1 - 1.0 K/uL   Eosinophils Relative 5 %   Eosinophils Absolute 0.4 0.0 - 0.7 K/uL   Basophils Relative 0 %   Basophils Absolute 0.0 0.0 - 0.1 K/uL  Glucose, capillary     Status: Abnormal   Collection Time: 03/02/16  6:29 AM  Result Value Ref Range   Glucose-Capillary 188 (H) 65 - 99 mg/dL    Dg Chest Port 1 View  Result Date: 03/02/2016 CLINICAL DATA:  Acute onset of postoperative fever. Initial encounter. EXAM: PORTABLE CHEST 1 VIEW COMPARISON:  Chest radiograph performed 02/27/2016 FINDINGS: There is mild elevation of the right hemidiaphragm. Mild bibasilar atelectasis is noted. No pleural effusion or pneumothorax is seen. The cardiomediastinal silhouette is borderline enlarged. No acute osseous abnormalities are identified. IMPRESSION: Mild elevation of the right hemidiaphragm. Mild bibasilar atelectasis noted. Borderline cardiomegaly. Electronically Signed   By: Garald Balding M.D.   On: 03/02/2016 02:53    Review of Systems   Constitutional:  Negative for chills and fever.  Respiratory: Negative for shortness of breath and wheezing.   Cardiovascular: Negative for chest pain and palpitations.  Gastrointestinal: Negative for abdominal pain, nausea and vomiting.  Genitourinary: Negative for dysuria.  Neurological: Negative for tingling and headaches.   Blood pressure 130/83, pulse 96, temperature 98.8 F (37.1 C), temperature source Oral, resp. rate 16, SpO2 100 %. Physical Exam  Constitutional: He is oriented to person, place, and time. He is cooperative. No distress.  Cardiovascular: Normal rate, regular rhythm, S1 normal and S2 normal.   Pulmonary/Chest:  Clear anterior fields Decreased at bases   Abdominal:  Soft, NT, + BS   Musculoskeletal:  Right Lower Extremity  Inspection:   Spanning ex fix R knee    Ex fix is stable   Ace wrap to skin/soft tissue   Swelling to R leg stable   + knee effusion  Bony eval:   TTP R proximal tibia    Hip and ankle are nontender   No pain with axial loading or log rolling of hip   No pain with passive motion of ankle  Soft tissue:   ACE wrap removed from from leg   Pt with significant blistering along path of ace wrap posteriorly and medially. No blistering laterally    Skin wrinkles along the lateral aspect of the R knee    Overall pinsites look good. Some pressure noted along distal femur pin. Not signs of infection    Mild swelling distally    Dystrophic toe nails   ROM:    Good passive ankle ROM     Knee and hip ROM not assessed Sensation:   DPN, SPN, TN sensation grossly intact but does note mild decreased from contralateral side  Motor:    EHL, FHL and lesser toe motor grossly intact    Weak ankle extension     Intact ankle flexion  Vascular:    + DP pulse    Compartments soft, no pain with passive stretch     Ext warm    Neurological: He is alert and oriented to person, place, and time.  Psychiatric: He has a normal mood and affect. His speech  is normal and behavior is normal. Thought content normal. Cognition and memory are normal.  Nursing note and vitals reviewed.    Assessment/Plan:  50 y/o male s/p fall off ladder (28f) with complex bicondylar R tibial plateau fracture   - Fall of ladder  - R bicondylar tibial plateau fx with severe lateral joint depression  S/p Ex fix   Will need plate osteosynthesis to definitively address his fracture   Soft tissue swelling looks good, do not think blisters will hamper surgery   Plan for OR Thursday of this week for ORIF R tibial plateau and removal of ex fix   Aggressive ice and elevation above heart, toe motion   Will have ortho tech place padded compressive dressing on pts leg from foot to just above knee (stopping below proximal set of pins)   Pt has severe joint depression and will require bone grafting: cancellous chips vs autograft.  Concerned about pts ability to heal fracture in setting of poorly controlled DM. Pt may need grafting material that provides more biology than cancellous graft alone.  RIA vs ICBG   Will be NWB x 8 weeks post op   Unrestricted ROM R knee post op, hinged knee brace   Had very lengthy discussion about the importance of maintaining tight blood sugar control. This  will be probably the most important thing in terms of his overall outcome. Discussed that maintaining blood sugar is less than 200 mg/L is important to decrease the chances of deep infection with nonunion. We discussed that these infection could possibly be additional surgical interventions included but not limited to amputation and in his circumstance would likely be an AKA. Patient and family are completely onboard with a tight sugar control. I did discuss with them that I want him to find a primary care physician before discharge    - Pain management:  Tylenol 1000 mg po q6h scheduled  Oxy IR 5-10 mg po q3h prn pain  Robaxin 500 mg po q6h prn spasms   Dilaudid 17m IV q2h prn severe pain     - ABL anemia/Hemodynamics  Stable  Check cbc preop   - Medical issues   DM   Appreciate medicine assistance with management   See above    Critical sugars remain in check to prevent complications    Fever   Afebrile yesterday afternoon   CXR yesterday evening shows Bibasilar ATX   Aggressive IS    Elevated alk phos   Likely related to fx    Remaining LFTs normal   - DVT/PE prophylaxis:  Lovenox   - ID:   periop abx starting morning of surgery   - Metabolic Bone Disease:  Due to prolonged poorly controlled diabetes will check metabolic bone labs  May place on Forteo post op based on lab and intra-op findings   Will need DEXA as well as outpt   - Activity:  NWB R leg   - FEN/GI prophylaxis/Foley/Lines:  Carb mod diet  Continue with IVF  - Impediments to fracture healing:  DIABETES!! - Dispo:  OR Thursday for ORIF R tibial plateau    KJari Pigg PA-C [facility] [phone](P) 03/02/2016, 10:36 AM

## 2016-03-03 ENCOUNTER — Inpatient Hospital Stay (HOSPITAL_COMMUNITY): Payer: BLUE CROSS/BLUE SHIELD

## 2016-03-03 DIAGNOSIS — E1165 Type 2 diabetes mellitus with hyperglycemia: Secondary | ICD-10-CM

## 2016-03-03 DIAGNOSIS — S82141A Displaced bicondylar fracture of right tibia, initial encounter for closed fracture: Principal | ICD-10-CM

## 2016-03-03 LAB — URINE CULTURE: Culture: NO GROWTH

## 2016-03-03 LAB — GLUCOSE, CAPILLARY
Glucose-Capillary: 156 mg/dL — ABNORMAL HIGH (ref 65–99)
Glucose-Capillary: 185 mg/dL — ABNORMAL HIGH (ref 65–99)
Glucose-Capillary: 141 mg/dL — ABNORMAL HIGH (ref 65–99)
Glucose-Capillary: 190 mg/dL — ABNORMAL HIGH (ref 65–99)

## 2016-03-03 LAB — TESTOSTERONE: Testosterone: 123 ng/dL — ABNORMAL LOW (ref 264–916)

## 2016-03-03 LAB — CALCIUM, IONIZED: Calcium, Ionized, Serum: 4.8 mg/dL (ref 4.5–5.6)

## 2016-03-03 LAB — PTH, INTACT AND CALCIUM
Calcium, Total (PTH): 8.6 mg/dL — ABNORMAL LOW (ref 8.7–10.2)
PTH: 28 pg/mL (ref 15–65)

## 2016-03-03 LAB — TESTOSTERONE, FREE: Testosterone, Free: 1.7 pg/mL — ABNORMAL LOW (ref 7.2–24.0)

## 2016-03-03 LAB — CALCITRIOL (1,25 DI-OH VIT D): Vit D, 1,25-Dihydroxy: 67 pg/mL (ref 19.9–79.3)

## 2016-03-03 LAB — SEX HORMONE BINDING GLOBULIN: Sex Hormone Binding: 50.4 nmol/L (ref 19.3–76.4)

## 2016-03-03 LAB — VITAMIN D 25 HYDROXY (VIT D DEFICIENCY, FRACTURES): Vit D, 25-Hydroxy: 18.9 ng/mL — ABNORMAL LOW (ref 30.0–100.0)

## 2016-03-03 MED ORDER — CEFAZOLIN SODIUM-DEXTROSE 2-4 GM/100ML-% IV SOLN
2.0000 g | INTRAVENOUS | Status: AC
  Administered 2016-03-04 (×2): 2 g via INTRAVENOUS
  Filled 2016-03-03: qty 100

## 2016-03-03 MED ORDER — SALINE SPRAY 0.65 % NA SOLN
1.0000 | NASAL | Status: DC | PRN
  Administered 2016-03-03: 1 via NASAL
  Filled 2016-03-03: qty 44

## 2016-03-03 NOTE — Progress Notes (Signed)
Orthopaedic Trauma Service Progress Note  Subjective  Doing well this am No complaints or concerns Ready for surgery tomorrow  Has been afebrile for >24 hours   ROS As above   Objective   BP 136/85 (BP Location: Left Arm)   Pulse 91   Temp 97.8 F (36.6 C) (Oral)   Resp 17   SpO2 99%   Intake/Output      09/26 0701 - 09/27 0700 09/27 0701 - 09/28 0700   P.O. 720 240   I.V. 1200 225   IV Piggyback 0    Total Intake 1920 465   Urine     Stool     Total Output       Net +1920 +465        Urine Occurrence 1 x    Stool Occurrence 1 x      Labs  CBG (last 3)   Recent Labs  03/02/16 1608 03/02/16 2155 03/03/16 0641  GLUCAP 234* 145* 156*   Results for Lawrence [name], Lawrence [name] (MRN [id number]) as of 03/03/2016 10:53  Ref. Range 03/02/2016 04:00  Vitamin D, 25-Hydroxy Latest Ref Range: 30.0 - 100.0 ng/mL 18.9 (L)   Results for Lawrence [name], Lawrence [name] (MRN [id number]) as of 03/03/2016 10:53  Ref. Range 03/02/2016 04:00  Sex Horm Binding Glob, Serum Latest Ref Range: 19.3 - 76.4 nmol/L 50.4  Testosterone Latest Ref Range: 264 - 916 ng/dL 123 (L)  Testosterone Free Latest Ref Range: 7.2 - 24.0 pg/mL 1.7 (L)  Results for Lawrence [name], Lawrence [name] (MRN [id number]) as of 03/03/2016 10:53  Ref. Range 02/28/2016 10:56  Hemoglobin A1C Latest Ref Range: 4.8 - 5.6 % 12.2 (H)   Results for Lawrence [name], Lawrence [name] (MRN [id number]) as of 03/03/2016 10:53  Ref. Range 03/02/2016 04:00  PTH Latest Ref Range: 15 - 65 pg/mL 28  TSH Latest Ref Range: 0.350 - 4.500 uIU/mL 1.453   Exam  Gen: awake and alert, resting comfortably in bed, NAD  Ext:       Right Lower Extremity   Ex fix stable  Bulky compressive dressing in place  Motor and sensory exam unchanged  Swelling stable  No new changes noted    Assessment and Plan   POD/HD#: 42  50 y/o male s/p fall off ladder (28f) with complex bicondylar R tibial plateau fracture   - Fall of ladder  - R bicondylar tibial plateau fx with severe lateral joint  depression             S/p Ex fix   OR tomorrow  See consult note for full plan  NWB x 8 weeks  Unrestricted ROM post op                        - Pain management:             Tylenol 1000 mg po q6h scheduled             Oxy IR 5-10 mg po q3h prn pain             Robaxin 500 mg po q6h prn spasms              Dilaudid 117mIV q2h prn severe pain               - ABL anemia/Hemodynamics             Stable             Check cbc  preop   - Medical issues               DM                         Appreciate medicine assistance with management                         See above                          Critical sugars remain in check to prevent complications               Fever                         Afebrile >24 hours              Elevated alk phos                         Likely related to fx                          Remaining LFTs normal   - DVT/PE prophylaxis:             Lovenox   - ID:              periop abx starting morning of surgery   - Metabolic Bone Disease:             vitamin d deficiency, 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  Vitamin d deficiency   Testosterone deficiency   - Dispo:             [place], PA-C [facility] [phone] (O) 03/03/2016 10:54 AM

## 2016-03-03 NOTE — Progress Notes (Signed)
Physical Therapy Treatment Patient Details Name: Lawrence [name] MRN: [id number] DOB: Mar 17, 1966 Today's Date: 03/03/2016    History of Present Illness Pt is a 50 y/o male admitted after falling from his ladder, sustaining a tibial plateau fx. Pt now with ex fix on L LE. No pertinent PMH.    PT Comments    Pt performed PT treatment this pm with emphasis on WC parts and mobility.  Pt to go for surgery in am but motivated to get up in pm after surgery.  Will f/u with patient tomorrow if stable post op.    Follow Up Recommendations  Home health PT;Supervision for mobility/OOB     Equipment Recommendations  Rolling walker with 5" wheels;3in1 (PT);Wheelchair (measurements PT);Wheelchair cushion (measurements PT) (with elevating leg rests.  )    Recommendations for Other Services       Precautions / Restrictions      Mobility  Bed Mobility Overal bed mobility: Needs Assistance Bed Mobility: Supine to Sit     Supine to sit: Min assist     General bed mobility comments: Requires assist with RLE.  Cues for hand placement and trunk control.  Heavy use of arms with trapeze  Transfers Overall transfer level: Needs assistance Equipment used: Rolling walker (2 wheeled) Transfers: Sit to/from [employer] Sit to Stand: Min guard Stand pivot transfers: Min guard Squat pivot transfers: Min guard     General transfer comment: Cues for hand placement and body position in prep for transfers.  Pt does a good job maintaining R NWB.    Ambulation/Gait             General Gait Details: Steps to chair for stand pivot, deferred gait for WC mobility.     Stairs            Information systems manager mobility: Yes Wheelchair propulsion: Both upper extremities Wheelchair parts: Needs assistance Distance: 300 ft with min A to supervision.  Cues for turning and backing.  Pt required education on parts including: brakes, arm rests, cushion,  removing leg rests, and putting on leg rests.  Modified Rankin (Stroke Patients Only)       Balance Overall balance assessment: Needs assistance   Sitting balance-Leahy Scale: Fair   Postural control: Posterior lean   Standing balance-Leahy Scale: Poor                      Cognition Arousal/Alertness: Awake/alert Behavior During Therapy: WFL for tasks assessed/performed Overall Cognitive Status: Within Functional Limits for tasks assessed                      Exercises      General Comments        Pertinent Vitals/Pain Pain Assessment: 0-10 Pain Score: 9  Pain Descriptors / Indicators: Constant;Guarding;Grimacing Pain Intervention(s): Monitored during session    Home Living                      Prior Function            PT Goals (current goals can now be found in the care plan section) Acute Rehab PT Goals Patient Stated Goal: return home PT Goal Formulation: With patient/family Potential to Achieve Goals: Good Additional Goals Additional Goal #1: Pt will demonstrate understanding of safe use of w/c for functional mobility. Progress towards PT goals: Progressing toward goals    Frequency    Min 5X/week  PT Plan Current plan remains appropriate    Co-evaluation             End of Session Equipment Utilized During Treatment: Gait belt Activity Tolerance: Patient limited by pain;Patient limited by fatigue Patient left: in chair;with call bell/phone within reach;with family/visitor present     Time: [id number] PT Time Calculation (min) (ACUTE ONLY): 35 min  Charges:  $Therapeutic Activity: 8-22 mins $Wheel Chair Management: 8-22 mins                    G Codes:      Cristela Blue 2016/03/07, 5:54 PM  Governor Rooks, PTA pager [phone]

## 2016-03-03 NOTE — Progress Notes (Addendum)
[facility] PROGRESS NOTE  Lawrence [name] [id number] DOB: 04-21-1966 DOA: 02/27/2016  PCP: No primary care provider on file.   Reason for visit: Uncontrolled diabetes mellitus   Brief History/Interval Summary: 50 year old male of Hispanic origin who has a past medical history of poorly controlled diabetes who was admitted on September 22 after sustaining injury as a result of a fall from a ladder onto concrete. He was found to have communicated displaced right tibial plateau fracture. Reconstruction is planned for Tuesday, September 26. Medicine was consulted to assist with management of diabetes.  Procedures: None yet  Subjective/Interval History: Patient feels well this morning. Denies any pain. His family is at the bedside. He has been self administering insulin and seems optimistic about his condition.   ROS: Denies any nausea or vomiting.  Objective:  Vital Signs  Vitals:   03/02/16 0557 03/02/16 1300 03/02/16 2055 03/03/16 0518  BP: 130/83 133/69 (!) 132/94 136/85  Pulse: 96 95 100 91  Resp: 16 16 16 17   Temp: 98.8 F (37.1 C) 98.6 F (37 C) 98.6 F (37 C) 97.8 F (36.6 C)  TempSrc: Oral Oral Oral Oral  SpO2: 100% 100% 99% 99%    Intake/Output Summary (Last 24 hours) at 03/03/16 1145 Last data filed at 03/03/16 1000  Gross per 24 hour  Intake             1920 ml  Output                0 ml  Net             1920 ml   There were no vitals filed for this visit.  General appearance: alert, cooperative, appears stated age, no distress and moderately obese Resp: clear to auscultation bilaterally Cardio: regular rate and rhythm, S1, S2 normal, no murmur, click, rub or gallop GI: soft, non-tender; bowel sounds normal; no masses,  no organomegaly Extremities: Right lower extremity is immobilized.   Lab Results:  Data Reviewed: I have personally reviewed following labs and imaging studies  CBC:  Recent Labs Lab 02/27/16 1600 03/01/16 0938  03/02/16 0400  WBC 11.7* 9.3 8.0  NEUTROABS 9.6*  --  5.1  HGB 13.8 12.1* 10.8*  HCT 40.2 35.0* 31.2*  MCV 86.8 86.8 87.6  PLT 192 166 0000000    Basic Metabolic Panel:  Recent Labs Lab 02/27/16 1600 03/01/16 0512 03/02/16 0400  NA 135 131* 133*  K 4.2 3.4* 3.8  CL 103 98* 99*  CO2 22 27 27   GLUCOSE 356* 122* 187*  BUN 14 10 10   CREATININE 0.68 0.58* 0.53*  CALCIUM 9.0 8.0* 8.6*  8.6*  MG  --   --  1.9  PHOS  --   --  3.8    GFR: CrCl cannot be calculated (Unknown ideal weight.).  Coagulation Profile:  Recent Labs Lab 02/27/16 1914  INR 1.11    HbA1C: No results for input(s): HGBA1C in the last 72 hours.  CBG:  Recent Labs Lab 03/02/16 1106 03/02/16 1608 03/02/16 2155 03/03/16 0641 03/03/16 1113  GLUCAP 188* 234* 145* 156* 185*     Recent Results (from the past 240 hour(s))  Surgical pcr screen     Status: None   Collection Time: 02/27/16  5:57 PM  Result Value Ref Range Status   MRSA, PCR NEGATIVE NEGATIVE Final   Staphylococcus aureus NEGATIVE NEGATIVE Final    Comment:        The Xpert SA Assay (FDA approved for  NASAL specimens in patients over 94 years of age), is one component of a comprehensive surveillance program.  Test performance has been validated by [facility] for patients greater than or equal to 53 year old. It is not intended to diagnose infection nor to guide or monitor treatment.   Culture, blood (Routine X 2) w Reflex to ID Panel     Status: None (Preliminary result)   Collection Time: 03/01/16  9:49 AM  Result Value Ref Range Status   Specimen Description BLOOD LEFT ARM  Final   Special Requests BOTTLES DRAWN AEROBIC AND ANAEROBIC 8CC EACH  Final   Culture NO GROWTH 1 DAY  Final   Report Status PENDING  Incomplete  Culture, blood (Routine X 2) w Reflex to ID Panel     Status: None (Preliminary result)   Collection Time: 03/01/16  9:53 AM  Result Value Ref Range Status   Specimen Description BLOOD RIGHT ARM  Final    Special Requests BOTTLES DRAWN AEROBIC AND ANAEROBIC 10CC EACH  Final   Culture NO GROWTH 1 DAY  Final   Report Status PENDING  Incomplete  Urine culture     Status: None   Collection Time: 03/02/16  1:15 AM  Result Value Ref Range Status   Specimen Description URINE, CLEAN CATCH  Final   Special Requests NONE  Final   Culture NO GROWTH  Final   Report Status 03/03/2016 FINAL  Final      Radiology Studies: Dg Chest Port 1 View  Result Date: 03/02/2016 CLINICAL DATA:  Acute onset of postoperative fever. Initial encounter. EXAM: PORTABLE CHEST 1 VIEW COMPARISON:  Chest radiograph performed 02/27/2016 FINDINGS: There is mild elevation of the right hemidiaphragm. Mild bibasilar atelectasis is noted. No pleural effusion or pneumothorax is seen. The cardiomediastinal silhouette is borderline enlarged. No acute osseous abnormalities are identified. IMPRESSION: Mild elevation of the right hemidiaphragm. Mild bibasilar atelectasis noted. Borderline cardiomegaly. Electronically Signed   By: Garald Balding M.D.   On: 03/02/2016 02:53     Medications:  Scheduled: . acetaminophen  1,000 mg Oral Q6H  . [START ON 03/04/2016]  ceFAZolin (ANCEF) IV  2 g Intravenous To OR  . docusate sodium  100 mg Oral BID  . enoxaparin (LOVENOX) injection  40 mg Subcutaneous Q24H  . insulin aspart  0-15 Units Subcutaneous TID WC  . insulin aspart  4 Units Subcutaneous TID AC  . insulin glargine  14 Units Subcutaneous QHS  . polyethylene glycol  17 g Oral BID  . senna  1 tablet Oral Daily   Continuous: . sodium chloride 75 mL/hr at 03/03/16 0956  . lactated ringers     [id number], HYDROmorphone (DILAUDID) injection, methocarbamol **OR** methocarbamol (ROBAXIN)  IV, metoCLOPramide **OR** metoCLOPramide (REGLAN) injection, ondansetron **OR** ondansetron (ZOFRAN) IV, oxyCODONE  Assessment/Plan:  Active Problems:   Tibial plateau fracture   Diabetes ([place])    Poorly controlled diabetes mellitus type  II. HbA1c is 12.2 , implying very poor Glycemic control, this is correlate with mean plasma glucose of 303. Started on Lantus insulin adjusted to 15 units daily at bedtime. Currently on 5 units of NovoLog with meals, we will evaluate for prandial insulin versus metformin only on discharge.  Hyponatremia and hypokalemia Potassium is improved. Sodium is better as well. He is on IV fluids.  Fever Patient continues to have fever. No obvious source of infection identified.  Blood cultures are pending. Chest x-ray does not show any infiltrates. Atelectasis was noted. UA did not suggest infection.  Close monitoring.  Constipation. Secondary to narcotics. Increase laxatives. TSH was normal.  Tibial plateau fracture Management per orthopedics.  Thank you for this consult. I will continue to this follow patient with you.    LOS: 5 days   [facility] A  [facility] Pager [phone] 03/03/2016, 11:45 AM  If 7PM-7AM, please contact night-coverage at [email or web address], password [facility]

## 2016-03-03 NOTE — Anesthesia Preprocedure Evaluation (Addendum)
Anesthesia Evaluation  Patient identified by MRN, date of birth, ID band Patient awake    Reviewed: Allergy & Precautions, NPO status , Patient's Chart, lab work & pertinent test results  History of Anesthesia Complications Negative for: history of anesthetic complications  Airway Mallampati: II  TM Distance: >3 FB Neck ROM: Full    Dental  (+) Teeth Intact, Dental Advisory Given   Pulmonary neg pulmonary ROS,    Pulmonary exam normal breath sounds clear to auscultation       Cardiovascular Exercise Tolerance: Good hypertension, (-) angina(-) Past MI, (-) Cardiac Stents, (-) CABG and (-) Orthopnea  Rhythm:Regular Rate:Normal  EKG 02/27/2016: Sinus tachycardia   Neuro/Psych negative neurological ROS     GI/Hepatic negative GI ROS, Neg liver ROS,   Endo/Other  diabetes (started on Lantus this admission; Hgb A1c 12.2), Poorly Controlled, Type 2, Oral Hypoglycemic Agents  Renal/GU negative Renal ROS     Musculoskeletal Right tibial plateau fracture   Abdominal   Peds  Hematology  (+) Blood dyscrasia (Hgb 10.8), anemia ,   Anesthesia Other Findings Hyponatremia (133)  Reproductive/Obstetrics                            Anesthesia Physical Anesthesia Plan  ASA: III  Anesthesia Plan: General and Regional   Post-op Pain Management: GA combined w/ Regional for post-op pain   Induction: Intravenous  Airway Management Planned: Oral ETT  Additional Equipment:   Intra-op Plan:   Post-operative Plan: Extubation in OR  Informed Consent: I have reviewed the patients History and Physical, chart, labs and discussed the procedure including the risks, benefits and alternatives for the proposed anesthesia with the patient or authorized representative who has indicated his/her understanding and acceptance.   Dental advisory given  Plan Discussed with: CRNA  Anesthesia Plan Comments: (Patient  refuses blood products but will accept albumin.  Risks of general anesthesia discussed including, but not limited to, sore throat, hoarse voice, chipped/damaged teeth, injury to vocal cords, nausea and vomiting, allergic reactions, lung infection, heart attack, stroke, and death. All questions answered.  Discussed potential risks of nerve blocks including, but not limited to, infection, bleeding, nerve damage, seizures, pneumothorax, respiratory depression, and potential failure of the block. Alternatives to nerve blocks discussed. All questions answered. )     Anesthesia Quick Evaluation

## 2016-03-04 ENCOUNTER — Inpatient Hospital Stay (HOSPITAL_COMMUNITY): Payer: BLUE CROSS/BLUE SHIELD | Admitting: Anesthesiology

## 2016-03-04 ENCOUNTER — Encounter (HOSPITAL_COMMUNITY)
Admission: EM | Disposition: A | Discharge status: Home Health | Payer: Self-pay | Source: Home / Self Care | Attending: Orthopedic Surgery

## 2016-03-04 ENCOUNTER — Encounter (HOSPITAL_COMMUNITY): Payer: Self-pay | Admitting: *Deleted

## 2016-03-04 ENCOUNTER — Inpatient Hospital Stay (HOSPITAL_COMMUNITY): Payer: BLUE CROSS/BLUE SHIELD

## 2016-03-04 HISTORY — PX: ORIF TIBIA PLATEAU: SHX2132

## 2016-03-04 HISTORY — PX: EXTERNAL FIXATION REMOVAL: SHX5040

## 2016-03-04 HISTORY — PX: HARVEST BONE GRAFT: SHX377

## 2016-03-04 LAB — COMPREHENSIVE METABOLIC PANEL
ALT: 23 U/L (ref 17–63)
AST: 20 U/L (ref 15–41)
Albumin: 2.8 g/dL — ABNORMAL LOW (ref 3.5–5.0)
Alkaline Phosphatase: 217 U/L — ABNORMAL HIGH (ref 38–126)
Anion gap: 10 (ref 5–15)
BUN: 7 mg/dL (ref 6–20)
CO2: 28 mmol/L (ref 22–32)
Calcium: 8.6 mg/dL — ABNORMAL LOW (ref 8.9–10.3)
Chloride: 97 mmol/L — ABNORMAL LOW (ref 101–111)
Creatinine, Ser: 0.6 mg/dL — ABNORMAL LOW (ref 0.61–1.24)
GFR calc Af Amer: >60 mL/min (ref >60)
GFR calc non Af Amer: >60 mL/min (ref >60)
Glucose, Bld: 181 mg/dL — ABNORMAL HIGH (ref 65–99)
Potassium: 3.9 mmol/L (ref 3.5–5.1)
Sodium: 135 mmol/L (ref 135–145)
Total Bilirubin: 1.1 mg/dL (ref 0.3–1.2)
Total Protein: 6 g/dL — ABNORMAL LOW (ref 6.5–8.1)

## 2016-03-04 LAB — CBC
HCT: 32.1 % — ABNORMAL LOW (ref 39.0–52.0)
HCT: 25.8 % — ABNORMAL LOW (ref 39.0–52.0)
Hemoglobin: 10.9 g/dL — ABNORMAL LOW (ref 13.0–17.0)
Hemoglobin: 8.4 g/dL — ABNORMAL LOW (ref 13.0–17.0)
MCH: 30.3 pg (ref 26.0–34.0)
MCH: 29.1 pg (ref 26.0–34.0)
MCHC: 34 g/dL (ref 30.0–36.0)
MCHC: 32.6 g/dL (ref 30.0–36.0)
MCV: 89.2 fL (ref 78.0–100.0)
MCV: 89.3 fL (ref 78.0–100.0)
Platelets: 237 10*3/uL (ref 150–400)
Platelets: 221 10*3/uL (ref 150–400)
RBC: 3.6 MIL/uL — ABNORMAL LOW (ref 4.22–5.81)
RBC: 2.89 MIL/uL — ABNORMAL LOW (ref 4.22–5.81)
RDW: 12.2 % (ref 11.5–15.5)
RDW: 12.2 % (ref 11.5–15.5)
WBC: 6.5 10*3/uL (ref 4.0–10.5)
WBC: 7.8 10*3/uL (ref 4.0–10.5)

## 2016-03-04 LAB — GLUCOSE, CAPILLARY
Glucose-Capillary: 176 mg/dL — ABNORMAL HIGH (ref 65–99)
Glucose-Capillary: 228 mg/dL — ABNORMAL HIGH (ref 65–99)
Glucose-Capillary: 232 mg/dL — ABNORMAL HIGH (ref 65–99)
Glucose-Capillary: 253 mg/dL — ABNORMAL HIGH (ref 65–99)
Glucose-Capillary: 201 mg/dL — ABNORMAL HIGH (ref 65–99)

## 2016-03-04 LAB — TYPE AND SCREEN
ABO/RH(D): O POS
Antibody Screen: NEGATIVE

## 2016-03-04 LAB — PROTIME-INR
INR: 0.98
Prothrombin Time: 13 seconds (ref 11.4–15.2)

## 2016-03-04 LAB — APTT: aPTT: 31 seconds (ref 24–36)

## 2016-03-04 LAB — ABO/RH: ABO/RH(D): O POS

## 2016-03-04 LAB — FOLLICLE STIMULATING HORMONE: FSH: 1.5 m[IU]/mL (ref 1.5–12.4)

## 2016-03-04 SURGERY — REMOVAL, EXTERNAL FIXATION DEVICE, LOWER EXTREMITY
Anesthesia: Regional | Site: Leg Lower | Laterality: Right

## 2016-03-04 MED ORDER — PHENYLEPHRINE HCL 10 MG/ML IJ SOLN
INTRAMUSCULAR | Status: DC | PRN
  Administered 2016-03-04 (×2): 80 ug via INTRAVENOUS
  Administered 2016-03-04: 160 ug via INTRAVENOUS
  Administered 2016-03-04: 80 ug via INTRAVENOUS

## 2016-03-04 MED ORDER — MIDAZOLAM HCL 2 MG/2ML IJ SOLN
INTRAMUSCULAR | Status: AC
  Filled 2016-03-04: qty 2

## 2016-03-04 MED ORDER — FENTANYL CITRATE (PF) 100 MCG/2ML IJ SOLN
INTRAMUSCULAR | Status: AC
  Filled 2016-03-04: qty 2

## 2016-03-04 MED ORDER — FENTANYL CITRATE (PF) 100 MCG/2ML IJ SOLN
50.0000 ug | Freq: Once | INTRAMUSCULAR | Status: AC
  Administered 2016-03-04: 50 ug via INTRAVENOUS

## 2016-03-04 MED ORDER — HYDROMORPHONE HCL 1 MG/ML IJ SOLN
0.2500 mg | INTRAMUSCULAR | Status: DC | PRN
  Administered 2016-03-04 (×4): 0.5 mg via INTRAVENOUS

## 2016-03-04 MED ORDER — EPHEDRINE SULFATE 50 MG/ML IJ SOLN
INTRAMUSCULAR | Status: DC | PRN
  Administered 2016-03-04: 5 mg via INTRAVENOUS
  Administered 2016-03-04: 10 mg via INTRAVENOUS

## 2016-03-04 MED ORDER — 0.9 % SODIUM CHLORIDE (POUR BTL) OPTIME
TOPICAL | Status: DC | PRN
  Administered 2016-03-04 (×2): 1000 mL

## 2016-03-04 MED ORDER — PROMETHAZINE HCL 25 MG/ML IJ SOLN: 6.2500 mg | INTRAMUSCULAR | Status: DC | PRN

## 2016-03-04 MED ORDER — PHENYLEPHRINE HCL 10 MG/ML IJ SOLN
INTRAVENOUS | Status: DC | PRN
  Administered 2016-03-04: 20 ug/min via INTRAVENOUS

## 2016-03-04 MED ORDER — SUGAMMADEX SODIUM 500 MG/5ML IV SOLN
INTRAVENOUS | Status: DC | PRN
  Administered 2016-03-04: 200 mg via INTRAVENOUS

## 2016-03-04 MED ORDER — ROCURONIUM BROMIDE 100 MG/10ML IV SOLN
INTRAVENOUS | Status: DC | PRN
  Administered 2016-03-04 (×2): 20 mg via INTRAVENOUS
  Administered 2016-03-04: 30 mg via INTRAVENOUS
  Administered 2016-03-04 (×2): 50 mg via INTRAVENOUS
  Administered 2016-03-04 (×2): 30 mg via INTRAVENOUS
  Administered 2016-03-04: 20 mg via INTRAVENOUS

## 2016-03-04 MED ORDER — PROPOFOL 10 MG/ML IV BOLUS
INTRAVENOUS | Status: DC | PRN
  Administered 2016-03-04: 200 mg via INTRAVENOUS

## 2016-03-04 MED ORDER — HYDROMORPHONE HCL 1 MG/ML IJ SOLN
INTRAMUSCULAR | Status: AC
  Administered 2016-03-04: 0.5 mg via INTRAVENOUS
  Filled 2016-03-04: qty 1

## 2016-03-04 MED ORDER — CEFAZOLIN SODIUM 1 G IJ SOLR
INTRAMUSCULAR | Status: AC
  Filled 2016-03-04: qty 20

## 2016-03-04 MED ORDER — PROPOFOL 10 MG/ML IV BOLUS
INTRAVENOUS | Status: AC
  Filled 2016-03-04: qty 20

## 2016-03-04 MED ORDER — MIDAZOLAM HCL 5 MG/5ML IJ SOLN
INTRAMUSCULAR | Status: DC | PRN
  Administered 2016-03-04: 2 mg via INTRAVENOUS

## 2016-03-04 MED ORDER — FENTANYL CITRATE (PF) 100 MCG/2ML IJ SOLN
INTRAMUSCULAR | Status: AC
  Administered 2016-03-04: 50 ug via INTRAVENOUS
  Filled 2016-03-04: qty 2

## 2016-03-04 MED ORDER — FENTANYL CITRATE (PF) 100 MCG/2ML IJ SOLN
INTRAMUSCULAR | Status: DC | PRN
  Administered 2016-03-04 (×3): 50 ug via INTRAVENOUS
  Administered 2016-03-04: 100 ug via INTRAVENOUS
  Administered 2016-03-04: 50 ug via INTRAVENOUS

## 2016-03-04 MED ORDER — ONDANSETRON HCL 4 MG/2ML IJ SOLN
INTRAMUSCULAR | Status: DC | PRN
  Administered 2016-03-04: 4 mg via INTRAVENOUS

## 2016-03-04 MED ORDER — LACTATED RINGERS IV SOLN
INTRAVENOUS | Status: DC | PRN
  Administered 2016-03-04 (×4): via INTRAVENOUS

## 2016-03-04 MED ORDER — MIDAZOLAM HCL 2 MG/2ML IJ SOLN
1.0000 mg | Freq: Once | INTRAMUSCULAR | Status: AC
  Administered 2016-03-04: 1 mg via INTRAVENOUS

## 2016-03-04 MED ORDER — PROPOFOL 10 MG/ML IV BOLUS
INTRAVENOUS | Status: AC
  Filled 2016-03-04: qty 40

## 2016-03-04 MED ORDER — ARTIFICIAL TEARS OP OINT
TOPICAL_OINTMENT | OPHTHALMIC | Status: DC | PRN
  Administered 2016-03-04: 1 via OPHTHALMIC

## 2016-03-04 MED ORDER — MIDAZOLAM HCL 2 MG/2ML IJ SOLN
INTRAMUSCULAR | Status: AC
  Administered 2016-03-04: 1 mg via INTRAVENOUS
  Filled 2016-03-04: qty 2

## 2016-03-04 MED ORDER — BUPIVACAINE-EPINEPHRINE (PF) 0.5% -1:200000 IJ SOLN
INTRAMUSCULAR | Status: DC | PRN
  Administered 2016-03-04: 30 mL via PERINEURAL

## 2016-03-04 SURGICAL SUPPLY — 111 items
BANDAGE ACE 4X5 VEL STRL LF (GAUZE/BANDAGES/DRESSINGS) ×3 IMPLANT
BANDAGE ACE 6X5 VEL STRL LF (GAUZE/BANDAGES/DRESSINGS) ×3 IMPLANT
BANDAGE ESMARK 6X9 LF (GAUZE/BANDAGES/DRESSINGS) ×2 IMPLANT
BIT DRILL 2.5X2.75 QC CALB (BIT) ×3 IMPLANT
BLADE SURG 10 STRL SS (BLADE) ×3 IMPLANT
BLADE SURG 15 STRL LF DISP TIS (BLADE) ×2 IMPLANT
BLADE SURG 15 STRL SS (BLADE) ×1
BLADE SURG ROTATE 9660 (MISCELLANEOUS) IMPLANT
BNDG COHESIVE 4X5 TAN STRL (GAUZE/BANDAGES/DRESSINGS) ×3 IMPLANT
BNDG COHESIVE 6X5 TAN STRL LF (GAUZE/BANDAGES/DRESSINGS) ×3 IMPLANT
BNDG ESMARK 6X9 LF (GAUZE/BANDAGES/DRESSINGS) ×3
BNDG GAUZE ELAST 4 BULKY (GAUZE/BANDAGES/DRESSINGS) ×6 IMPLANT
BONE CANC CHIPS 20CC PCAN1/4 (Bone Implant) ×3 IMPLANT
BRUSH SCRUB DISP (MISCELLANEOUS) ×6 IMPLANT
CANISTER SUCT 3000ML PPV (MISCELLANEOUS) ×3 IMPLANT
CHIPS CANC BONE 20CC PCAN1/4 (Bone Implant) ×2 IMPLANT
CLEANER TIP ELECTROSURG 2X2 (MISCELLANEOUS) ×3 IMPLANT
CLIP LOCKING FOR RIA (CLIP) ×3 IMPLANT
COVER MAYO STAND STRL (DRAPES) ×3 IMPLANT
COVER SURGICAL LIGHT HANDLE (MISCELLANEOUS) ×6 IMPLANT
CUFF TOURNIQUET SINGLE 18IN (TOURNIQUET CUFF) IMPLANT
CUFF TOURNIQUET SINGLE 24IN (TOURNIQUET CUFF) IMPLANT
CUFF TOURNIQUET SINGLE 34IN LL (TOURNIQUET CUFF) ×3 IMPLANT
DRAPE C-ARM 42X72 X-RAY (DRAPES) ×3 IMPLANT
DRAPE C-ARMOR (DRAPES) ×3 IMPLANT
DRAPE EXTREMITY T 121X128X90 (DRAPE) IMPLANT
DRAPE INCISE IOBAN 66X45 STRL (DRAPES) ×6 IMPLANT
DRAPE OEC MINIVIEW 54X84 (DRAPES) ×3 IMPLANT
DRAPE ORTHO SPLIT 77X108 STRL (DRAPES)
DRAPE SURG 17X23 STRL (DRAPES) ×3 IMPLANT
DRAPE SURG ORHT 6 SPLT 77X108 (DRAPES) IMPLANT
DRAPE U-SHAPE 47X51 STRL (DRAPES) ×3 IMPLANT
DRILL BIT 2.5X100 214235007 (MISCELLANEOUS) ×3 IMPLANT
DRIVE SHAFT SEAL FOR RIA ×3 IMPLANT
DRSG ADAPTIC 3X8 NADH LF (GAUZE/BANDAGES/DRESSINGS) ×3 IMPLANT
DRSG MEPILEX BORDER 4X4 (GAUZE/BANDAGES/DRESSINGS) ×3 IMPLANT
DRSG PAD ABDOMINAL 8X10 ST (GAUZE/BANDAGES/DRESSINGS) ×12 IMPLANT
ELECT REM PT RETURN 9FT ADLT (ELECTROSURGICAL) ×3
ELECTRODE REM PT RTRN 9FT ADLT (ELECTROSURGICAL) ×2 IMPLANT
EVACUATOR 1/8 PVC DRAIN (DRAIN) IMPLANT
EVACUATOR 3/16  PVC DRAIN (DRAIN)
EVACUATOR 3/16 PVC DRAIN (DRAIN) IMPLANT
GAUZE SPONGE 4X4 12PLY STRL (GAUZE/BANDAGES/DRESSINGS) ×3 IMPLANT
GLOVE BIO SURGEON STRL SZ7.5 (GLOVE) ×3 IMPLANT
GLOVE BIO SURGEON STRL SZ8 (GLOVE) ×3 IMPLANT
GLOVE BIOGEL PI IND STRL 7.5 (GLOVE) ×2 IMPLANT
GLOVE BIOGEL PI IND STRL 8 (GLOVE) ×2 IMPLANT
GLOVE BIOGEL PI INDICATOR 7.5 (GLOVE) ×1
GLOVE BIOGEL PI INDICATOR 8 (GLOVE) ×1
GOWN STRL REUS W/ TWL LRG LVL3 (GOWN DISPOSABLE) ×4 IMPLANT
GOWN STRL REUS W/ TWL XL LVL3 (GOWN DISPOSABLE) ×2 IMPLANT
GOWN STRL REUS W/TWL LRG LVL3 (GOWN DISPOSABLE) ×2
GOWN STRL REUS W/TWL XL LVL3 (GOWN DISPOSABLE) ×1
GRAFT FILTER FOR RIA 520 LGTH (MISCELLANEOUS) ×3 IMPLANT
IMMOBILIZER KNEE 22 UNIV (SOFTGOODS) ×3 IMPLANT
K-WIRE ACE 1.6X6 (WIRE) ×18
KIT BASIN OR (CUSTOM PROCEDURE TRAY) ×3 IMPLANT
KIT ROOM TURNOVER OR (KITS) ×3 IMPLANT
KWIRE ACE 1.6X6 (WIRE) ×12 IMPLANT
MANIFOLD NEPTUNE II (INSTRUMENTS) ×3 IMPLANT
NDL SUT 6 .5 CRC .975X.05 MAYO (NEEDLE) IMPLANT
NEEDLE 1/2 CIR CATGUT .05X1.09 (NEEDLE) ×3 IMPLANT
NEEDLE 22X1 1/2 (OR ONLY) (NEEDLE) IMPLANT
NEEDLE MAYO TAPER (NEEDLE)
NEEDLE MAYO TROCAR (NEEDLE) ×3 IMPLANT
NS IRRIG 1000ML POUR BTL (IV SOLUTION) ×3 IMPLANT
PACK ORTHO EXTREMITY (CUSTOM PROCEDURE TRAY) ×3 IMPLANT
PAD ABD 8X10 STRL (GAUZE/BANDAGES/DRESSINGS) ×9 IMPLANT
PAD ARMBOARD 7.5X6 YLW CONV (MISCELLANEOUS) ×6 IMPLANT
PAD CAST 4YDX4 CTTN HI CHSV (CAST SUPPLIES) ×2 IMPLANT
PADDING CAST COTTON 4X4 STRL (CAST SUPPLIES) ×1
PADDING CAST COTTON 6X4 STRL (CAST SUPPLIES) ×9 IMPLANT
PLATE LOCK 13H STD RT PROX TIB (Plate) ×3 IMPLANT
REAMER HEAD 13MM (MISCELLANEOUS) ×6 IMPLANT
REAMER ROD DEEP FLUTE 2.5X950 (INSTRUMENTS) ×3 IMPLANT
SCREW CORT FT 32X3.5XNONLOCK (Screw) ×4 IMPLANT
SCREW CORTICAL 3.5MM  28MM (Screw) ×2 IMPLANT
SCREW CORTICAL 3.5MM  32MM (Screw) ×2 IMPLANT
SCREW CORTICAL 3.5MM 28MM (Screw) ×4 IMPLANT
SCREW LOCK CORT STAR 3.5X65 (Screw) ×3 IMPLANT
SCREW LOCK CORT STAR 3.5X75 (Screw) ×6 IMPLANT
SCREW LOCK CORT STAR 3.5X80 (Screw) ×6 IMPLANT
SCREW LP 3.5X70MM (Screw) ×3 IMPLANT
SCREW LP 3.5X75MM (Screw) ×3 IMPLANT
SPONGE GAUZE 4X4 12PLY STER LF (GAUZE/BANDAGES/DRESSINGS) ×3 IMPLANT
SPONGE LAP 18X18 X RAY DECT (DISPOSABLE) ×6 IMPLANT
SPONGE SCRUB IODOPHOR (GAUZE/BANDAGES/DRESSINGS) ×3 IMPLANT
STAPLER VISISTAT 35W (STAPLE) ×3 IMPLANT
STOCKINETTE IMPERVIOUS LG (DRAPES) ×3 IMPLANT
STRIP CLOSURE SKIN 1/2X4 (GAUZE/BANDAGES/DRESSINGS) IMPLANT
SUCTION FRAZIER HANDLE 10FR (MISCELLANEOUS) ×1
SUCTION TUBE FRAZIER 10FR DISP (MISCELLANEOUS) ×2 IMPLANT
SUT ETHILON 3 0 PS 1 (SUTURE) IMPLANT
SUT PDS AB 2-0 CT1 27 (SUTURE) IMPLANT
SUT PROLENE 0 CT 2 (SUTURE) ×6 IMPLANT
SUT VIC AB 0 CT1 27 (SUTURE) ×2
SUT VIC AB 0 CT1 27XBRD ANBCTR (SUTURE) ×4 IMPLANT
SUT VIC AB 1 CT1 27 (SUTURE) ×1
SUT VIC AB 1 CT1 27XBRD ANBCTR (SUTURE) ×2 IMPLANT
SUT VIC AB 2-0 CT1 27 (SUTURE) ×2
SUT VIC AB 2-0 CT1 TAPERPNT 27 (SUTURE) ×4 IMPLANT
SYR 20ML ECCENTRIC (SYRINGE) IMPLANT
SYR CONTROL 10ML LL (SYRINGE) IMPLANT
TOWEL OR 17X24 6PK STRL BLUE (TOWEL DISPOSABLE) ×6 IMPLANT
TOWEL OR 17X26 10 PK STRL BLUE (TOWEL DISPOSABLE) ×6 IMPLANT
TRAY FOLEY CATH 16FRSI W/METER (SET/KITS/TRAYS/PACK) IMPLANT
TUBE ASSEMBLY RIA STERILE (MISCELLANEOUS) ×6 IMPLANT
TUBE CONNECTING 12X1/4 (SUCTIONS) ×3 IMPLANT
UNDERPAD 30X30 (UNDERPADS AND DIAPERS) ×3 IMPLANT
WATER STERILE IRR 1000ML POUR (IV SOLUTION) ×6 IMPLANT
YANKAUER SUCT BULB TIP NO VENT (SUCTIONS) ×3 IMPLANT

## 2016-03-04 NOTE — Transfer of Care (Signed)
Immediate Anesthesia Transfer of Care Note  Patient: Lawrence [name]  Procedure(s) Performed: Procedure(s): REMOVAL EXTERNAL FIXATION LEG (Right) OPEN REDUCTION INTERNAL FIXATION (ORIF) TIBIAL PLATEAU (Right) Rimmed IM aspirate (Left)  Patient Location: PACU  Anesthesia Type:GA combined with regional for post-op pain  Level of Consciousness: awake, oriented, sedated, patient cooperative and responds to stimulation  Airway & Oxygen Therapy: Patient Spontanous Breathing and Patient connected to nasal cannula oxygen  Post-op Assessment: Report given to RN, Post -op Vital signs reviewed and stable, Patient moving all extremities and Patient moving all extremities X 4  Post vital signs: Reviewed and stable  Last Vitals:  Vitals:   03/03/16 2235 03/04/16 0452  BP: (!) 156/75 (!) 145/76  Pulse: 100 97  Resp:    Temp: 36.8 C 36.8 C    Last Pain:  Vitals:   03/04/16 0454  TempSrc:   PainSc: Asleep      Patients Stated Pain Goal: 0 ([id number])  Complications: No apparent anesthesia complications

## 2016-03-04 NOTE — Anesthesia Procedure Notes (Signed)
Anesthesia Regional Block:  Femoral nerve block  Pre-Anesthetic Checklist: ,, timeout performed, Correct Patient, Correct Site, Correct Laterality, Correct Procedure, Correct Position, site marked, Risks and benefits discussed,  Surgical consent,  Pre-op evaluation,  At surgeon's request and post-op pain management  Laterality: Right  Prep: chloraprep       Needles:  Injection technique: Single-shot  Needle Type: Echogenic Stimulator Needle     Needle Length: 9cm 9 cm Needle Gauge: 21 and 21 G    Additional Needles:  Procedures: ultrasound guided (picture in chart) Femoral nerve block Narrative:  Start time: 03/04/2016 7:21 AM End time: 03/04/2016 7:24 AM Injection made incrementally with aspirations every 5 mL.  Performed by: Personally  Anesthesiologist: Nilda Simmer

## 2016-03-04 NOTE — Progress Notes (Signed)
Patient refused. Stated he does not wear one at home and he has oxygen in nasal cannula and it is working for him. RT made pt and family aware that if he changed his mind to call.

## 2016-03-04 NOTE — Progress Notes (Signed)
PT Cancellation Note  Patient Details Name: Lawrence [name] MRN: [id number] DOB: [birth year]   Cancelled Treatment:    Reason Eval/Treat Not Completed: Pain limiting ability to participate  Pt had surgery this am and remains to report intolerable pain.  Pt educated on importance of mobility and agreeable to participate in the am.  Pt with new orders to keep immobilizer on during mobility.     Aimee Eli Hose 03/04/2016, 4:26 PM  Governor Rooks, PTA pager [phone]

## 2016-03-04 NOTE — Progress Notes (Signed)
Pt w/ a temp of 102, HR 120s, alert, lung sounds clear. PA on call notified and made aware Rapid response notified and made aware. PA gave instruction for RN to encourage IS and to give scheduled tylenol as soon as it was due. Recheck one hour later- temp 98.6. Nursing will continue to monitor.

## 2016-03-04 NOTE — Progress Notes (Signed)
Patient refusing CPAP for tonight. 

## 2016-03-04 NOTE — Progress Notes (Signed)
Since returning from surgery, pt's HR fluctuating between 120s-130s. 10 mg of Oxycodone and Robaxin given at 1545, 1 mg Dilaudid given at 1830, and another 10 mg of Oxycodone given at [place] with no change in HR. Pt denies chest pain and VS otherwise stable. Keith-PA notified and received orders to increase IV fluids to 125 mL/hr and obtain a CBC. Orders placed and night RN updated. Nursing to continue to monitor

## 2016-03-04 NOTE — Brief Op Note (Signed)
02/27/2016 - 03/04/2016  10:29 PM  PATIENT:  Lawrence [name]  50 y.o. male  PRE-OPERATIVE DIAGNOSIS:  1. RIGHT BICONDYLAR TIBIAL PLATEAU 2. RIGHT TIBIAL SHAFT FRACTURE 3. SUSPECTED RIGHT LATERAL MENSICUS TEAR 4. RETAINED EX-FIX RIGHT LEG  POST-OPERATIVE DIAGNOSIS:   1. RIGHT BICONDYLAR TIBIAL PLATEAU 2. RIGHT TIBIAL SHAFT FRACTURE 3. RIGHT LATERAL MENSICUS AVULSION 70% INCLUDING ALL OF MIDBODY AND ANTERIOR HORN, MID BODY TEAR ALSO 4. RETAINED EX-FIX RIGHT LEG  PROCEDURE:  Procedure(s): 1. OPEN REDUCTION INTERNAL FIXATION RIGHT BICONDYLAR TIBIAL PLATEAU  2. OPEN REDUCTION INTERNAL FIXATION RIGHT TIBIAL SHAFT FRACTURE 3. ARTHROTOMY WITH REPAIR OF RIGHT LATERAL MENSICUS AVULSION 70% INCLUDING ALL OF MIDBODY AND ANTERIOR HORN, ALSO PARTIAL LATERAL MENISCECTOMY MID BODY TEAR ALSO 4. REMOVAL OF EXTERNAL FIXATOR RIGHT LEG 5. CURETTAGE AND DEBRIDEMENT OF ULCERATED PIN SITES 6. MAJOR BONE GRAFT LEFT FEMUR REAMED INTRAMEDULLARY ASPIRATION (RIA)  SURGEON:  Surgeon(s) and Role:    * Altamese [place], MD - Primary  PHYSICIAN ASSISTANT: None  ANESTHESIA:   general  EBL:  No intake/output data recorded.  BLOOD ADMINISTERED:none  DRAINS: none   LOCAL MEDICATIONS USED:  NONE  SPECIMEN:  No Specimen  DISPOSITION OF SPECIMEN:  N/A  COUNTS:  YES  TOURNIQUET:   None  DICTATION: .Other Dictation: Dictation Number [id number]  PLAN OF CARE: Admit to inpatient   PATIENT DISPOSITION:  PACU - hemodynamically stable.   Delay start of Pharmacological VTE agent (>24hrs) due to surgical blood loss or risk of bleeding: no

## 2016-03-04 NOTE — Anesthesia Procedure Notes (Addendum)
Procedure Name: Intubation Date/Time: 03/04/2016 8:05 AM Performed by: Jacquiline Doe A Pre-anesthesia Checklist: Patient identified, Emergency Drugs available, Suction available, Patient being monitored and Timeout performed Patient Re-evaluated:Patient Re-evaluated prior to inductionOxygen Delivery Method: Circle system utilized Preoxygenation: Pre-oxygenation with 100% oxygen Intubation Type: IV induction and Cricoid Pressure applied Ventilation: Oral airway inserted - appropriate to patient size and Mask ventilation without difficulty Laryngoscope Size: Mac and 4 Grade View: Grade I Tube type: Oral Tube size: 7.0 mm Number of attempts: 1 Airway Equipment and Method: Stylet Placement Confirmation: ETT inserted through vocal cords under direct vision,  positive ETCO2,  CO2 detector and breath sounds checked- equal and bilateral Secured at: 22 cm Tube secured with: Tape Dental Injury: Teeth and Oropharynx as per pre-operative assessment  Comments: Intubation per D. HUFF , SRNA .

## 2016-03-04 NOTE — Anesthesia Postprocedure Evaluation (Signed)
Anesthesia Post Note  Patient: Lawrence [name]  Procedure(s) Performed: Procedure(s) (LRB): REMOVAL EXTERNAL FIXATION LEG (Right) OPEN REDUCTION INTERNAL FIXATION (ORIF) TIBIAL PLATEAU (Right) Rimmed IM aspirate (Left)  Patient location during evaluation: PACU Anesthesia Type: General and Regional Level of consciousness: awake and alert Pain management: pain level controlled Vital Signs Assessment: post-procedure vital signs reviewed and stable Respiratory status: spontaneous breathing, nonlabored ventilation and respiratory function stable Cardiovascular status: blood pressure returned to baseline and stable Postop Assessment: no signs of nausea or vomiting Anesthetic complications: no    Last Vitals:  Vitals:   03/04/16 1435 03/04/16 1516  BP: (!) 157/65 (!) 137/97  Pulse: (!) 123 (!) 129  Resp: 13   Temp:  36.6 C    Last Pain:  Vitals:   03/04/16 1445  TempSrc:   PainSc: 5                  Nilda Simmer

## 2016-03-05 ENCOUNTER — Encounter (HOSPITAL_COMMUNITY): Payer: Self-pay | Admitting: Orthopedic Surgery

## 2016-03-05 ENCOUNTER — Inpatient Hospital Stay (HOSPITAL_COMMUNITY): Payer: BLUE CROSS/BLUE SHIELD

## 2016-03-05 DIAGNOSIS — R509 Fever, unspecified: Secondary | ICD-10-CM | POA: Insufficient documentation

## 2016-03-05 LAB — CBC
HCT: 25 % — ABNORMAL LOW (ref 39.0–52.0)
Hemoglobin: 8.2 g/dL — ABNORMAL LOW (ref 13.0–17.0)
MCH: 29.3 pg (ref 26.0–34.0)
MCHC: 32.8 g/dL (ref 30.0–36.0)
MCV: 89.3 fL (ref 78.0–100.0)
Platelets: 212 10*3/uL (ref 150–400)
RBC: 2.8 MIL/uL — ABNORMAL LOW (ref 4.22–5.81)
RDW: 12.3 % (ref 11.5–15.5)
WBC: 8.3 10*3/uL (ref 4.0–10.5)

## 2016-03-05 LAB — URINALYSIS, ROUTINE W REFLEX MICROSCOPIC
Bilirubin Urine: NEGATIVE
Glucose, UA: >1000 mg/dL — AB
Ketones, ur: 40 mg/dL — AB
Leukocytes, UA: NEGATIVE
Nitrite: NEGATIVE
Protein, ur: NEGATIVE mg/dL
Specific Gravity, Urine: <1.005 — ABNORMAL LOW (ref 1.005–1.030)
pH: 5.5 (ref 5.0–8.0)

## 2016-03-05 LAB — URINE MICROSCOPIC-ADD ON

## 2016-03-05 LAB — GLUCOSE, CAPILLARY
Glucose-Capillary: 177 mg/dL — ABNORMAL HIGH (ref 65–99)
Glucose-Capillary: 218 mg/dL — ABNORMAL HIGH (ref 65–99)
Glucose-Capillary: 102 mg/dL — ABNORMAL HIGH (ref 65–99)
Glucose-Capillary: 165 mg/dL — ABNORMAL HIGH (ref 65–99)
Glucose-Capillary: 198 mg/dL — ABNORMAL HIGH (ref 65–99)

## 2016-03-05 LAB — CREATININE, SERUM
Creatinine, Ser: 0.58 mg/dL — ABNORMAL LOW (ref 0.61–1.24)
GFR calc Af Amer: >60 mL/min (ref >60)
GFR calc non Af Amer: >60 mL/min (ref >60)

## 2016-03-05 LAB — PROLACTIN: Prolactin: 19.1 ng/mL — ABNORMAL HIGH (ref 4.0–15.2)

## 2016-03-05 LAB — LUTEINIZING HORMONE: LH: 1.3 m[IU]/mL — ABNORMAL LOW (ref 1.7–8.6)

## 2016-03-05 MED ORDER — CEFAZOLIN IN D5W 1 GM/50ML IV SOLN: 1.0000 g | Freq: Three times a day (TID) | INTRAVENOUS | Status: DC

## 2016-03-05 MED ORDER — VITAMIN C 500 MG PO TABS
500.0000 mg | ORAL_TABLET | Freq: Every day | ORAL | Status: DC
  Administered 2016-03-05 – 2016-03-08 (×4): 500 mg via ORAL
  Filled 2016-03-05 (×4): qty 1

## 2016-03-05 MED ORDER — KETOROLAC TROMETHAMINE 30 MG/ML IJ SOLN
30.0000 mg | Freq: Once | INTRAMUSCULAR | Status: AC
  Administered 2016-03-05: 30 mg via INTRAVENOUS
  Filled 2016-03-05: qty 1

## 2016-03-05 MED ORDER — INSULIN GLARGINE 100 UNIT/ML ~~LOC~~ SOLN
15.0000 [IU] | Freq: Every day | SUBCUTANEOUS | Status: DC
  Administered 2016-03-05: 15 [IU] via SUBCUTANEOUS
  Filled 2016-03-05 (×2): qty 0.15

## 2016-03-05 MED ORDER — CALCIUM CITRATE 950 (200 CA) MG PO TABS
200.0000 mg | ORAL_TABLET | Freq: Two times a day (BID) | ORAL | Status: DC
  Administered 2016-03-05 – 2016-03-08 (×7): 200 mg via ORAL
  Filled 2016-03-05 (×8): qty 1

## 2016-03-05 MED ORDER — VITAMIN D 1000 UNITS PO TABS
2000.0000 [IU] | ORAL_TABLET | Freq: Two times a day (BID) | ORAL | Status: DC
  Administered 2016-03-05 – 2016-03-08 (×7): 2000 [IU] via ORAL
  Filled 2016-03-05 (×7): qty 2

## 2016-03-05 MED ORDER — MAGNESIUM HYDROXIDE 400 MG/5ML PO SUSP
30.0000 mL | Freq: Once | ORAL | Status: AC
  Administered 2016-03-05: 30 mL via ORAL
  Filled 2016-03-05: qty 30

## 2016-03-05 MED ORDER — CEFAZOLIN IN D5W 1 GM/50ML IV SOLN
1.0000 g | Freq: Three times a day (TID) | INTRAVENOUS | Status: AC
  Administered 2016-03-05 – 2016-03-06 (×3): 1 g via INTRAVENOUS
  Filled 2016-03-05 (×3): qty 50

## 2016-03-05 MED ORDER — INSULIN ASPART 100 UNIT/ML ~~LOC~~ SOLN
5.0000 [IU] | Freq: Three times a day (TID) | SUBCUTANEOUS | Status: DC
  Administered 2016-03-05 – 2016-03-06 (×3): 5 [IU] via SUBCUTANEOUS

## 2016-03-05 NOTE — Progress Notes (Signed)
PT Cancellation Note  Patient Details Name: [name] MRN: [id number] DOB: 11-03-1965   Cancelled Treatment:    Reason Eval/Treat Not Completed: Pain limiting ability to participate;Other (comment) (Pt just received pain meds and eating lunch will return when meds are working and he is finished eating.  )   Cristela Blue 03/05/2016, 12:27 PM Governor Rooks, PTA pager [phone]

## 2016-03-05 NOTE — Progress Notes (Signed)
[facility] PROGRESS NOTE  Lawrence [name] [id number] DOB: 10/07/1965 DOA: 02/27/2016  PCP: No primary care provider on file.   Reason for visit: Uncontrolled diabetes mellitus   Brief History/Interval Summary: 50 year old male of Hispanic origin who has a past medical history of poorly controlled diabetes who was admitted on September 22 after sustaining injury as a result of a fall from a ladder onto concrete. He was found to have communicated displaced right tibial plateau fracture. Reconstruction is planned for Tuesday, September 26. Medicine was consulted to assist with management of diabetes.  Procedures: 3 surgeries for comminuted right tibial plateau fracture.  Subjective/Interval History: Seen with his wife and daughter at bedside.   ROS: Denies any nausea or vomiting.  Objective:  Vital Signs  Vitals:   03/04/16 2238 03/04/16 2357 03/05/16 0145 03/05/16 0406  BP:  138/75  (!) 155/82  Pulse: (!) 119 (!) 114  (!) 111  Resp:  16  16  Temp: 98.6 F (37 C) 99.7 F (37.6 C) (!) 100.8 F (38.2 C) 99 F (37.2 C)  TempSrc:  Oral  Oral  SpO2:  95%  100%    Intake/Output Summary (Last 24 hours) at 03/05/16 1133 Last data filed at 03/05/16 0900  Gross per 24 hour  Intake             2315 ml  Output             5125 ml  Net            -2810 ml   There were no vitals filed for this visit.  General appearance: alert, cooperative, appears stated age, no distress and moderately obese Resp: clear to auscultation bilaterally Cardio: regular rate and rhythm, S1, S2 normal, no murmur, click, rub or gallop GI: soft, non-tender; bowel sounds normal; no masses,  no organomegaly Extremities: Right lower extremity is immobilized.   Lab Results:  Data Reviewed: I have personally reviewed following labs and imaging studies  CBC:  Recent Labs Lab 02/27/16 1600 03/01/16 0938 03/02/16 0400 03/04/16 0424 03/04/16 2047 03/05/16 1007  WBC 11.7* 9.3 8.0 6.5 7.8 8.3    NEUTROABS 9.6*  --  5.1  --   --   --   HGB 13.8 12.1* 10.8* 10.9* 8.4* 8.2*  HCT 40.2 35.0* 31.2* 32.1* 25.8* 25.0*  MCV 86.8 86.8 87.6 89.2 89.3 89.3  PLT 192 166 181 237 221 [id number]    Basic Metabolic Panel:  Recent Labs Lab 02/27/16 1600 03/01/16 0512 03/02/16 0400 03/04/16 0424 03/05/16 0605  NA 135 131* 133* 135  --   K 4.2 3.4* 3.8 3.9  --   CL 103 98* 99* 97*  --   CO2 22 27 27 28   --   GLUCOSE 356* 122* 187* 181*  --   BUN 14 10 10 7   --   CREATININE 0.68 0.58* 0.53* 0.60* 0.58*  CALCIUM 9.0 8.0* 8.6*  8.6* 8.6*  --   MG  --   --  1.9  --   --   PHOS  --   --  3.8  --   --     GFR: CrCl cannot be calculated (Unknown ideal weight.).  Coagulation Profile:  Recent Labs Lab 02/27/16 1914 03/04/16 0424  INR 1.11 0.98    HbA1C: No results for input(s): HGBA1C in the last 72 hours.  CBG:  Recent Labs Lab 03/04/16 1608 03/04/16 2036 03/04/16 2200 03/05/16 0638 03/05/16 1054  GLUCAP 232* 253* 201* 177*  218*     Recent Results (from the past 240 hour(s))  Surgical pcr screen     Status: None   Collection Time: 02/27/16  5:57 PM  Result Value Ref Range Status   MRSA, PCR NEGATIVE NEGATIVE Final   Staphylococcus aureus NEGATIVE NEGATIVE Final    Comment:        The Xpert SA Assay (FDA approved for NASAL specimens in patients over 73 years of age), is one component of a comprehensive surveillance program.  Test performance has been validated by [facility] for patients greater than or equal to 65 year old. It is not intended to diagnose infection nor to guide or monitor treatment.   Culture, blood (Routine X 2) w Reflex to ID Panel     Status: None (Preliminary result)   Collection Time: 03/01/16  9:49 AM  Result Value Ref Range Status   Specimen Description BLOOD LEFT ARM  Final   Special Requests BOTTLES DRAWN AEROBIC AND ANAEROBIC 8CC EACH  Final   Culture NO GROWTH 3 DAYS  Final   Report Status PENDING  Incomplete  Culture, blood  (Routine X 2) w Reflex to ID Panel     Status: None (Preliminary result)   Collection Time: 03/01/16  9:53 AM  Result Value Ref Range Status   Specimen Description BLOOD RIGHT ARM  Final   Special Requests BOTTLES DRAWN AEROBIC AND ANAEROBIC 10CC EACH  Final   Culture NO GROWTH 3 DAYS  Final   Report Status PENDING  Incomplete  Urine culture     Status: None   Collection Time: 03/02/16  1:15 AM  Result Value Ref Range Status   Specimen Description URINE, CLEAN CATCH  Final   Special Requests NONE  Final   Culture NO GROWTH  Final   Report Status 03/03/2016 FINAL  Final      Radiology Studies: Dg Chest 2 View  Result Date: 03/05/2016 CLINICAL DATA:  Postoperative fever. EXAM: CHEST  2 VIEW COMPARISON:  03/01/2016 FINDINGS: Few linear densities at the right lung base may represent atelectasis. Otherwise, the lungs are clear. No pulmonary edema. Heart and mediastinum are within normal limits. The trachea is midline. Negative for a pneumothorax. No large pleural effusions. No acute bone abnormality. IMPRESSION: Minimal atelectasis at the right lung base. Electronically Signed   By: Markus Daft M.D.   On: 03/05/2016 09:58   Dg Tibia/fibula Right  Result Date: 03/04/2016 CLINICAL DATA:  ORIF comminuted right tibial plateau fracture. EXAM: RIGHT TIBIA AND FIBULA - 2 VIEW COMPARISON:  CT of the right tibia and fibula 02/27/2016. Right tibia fibula x-rays of that same date. FINDINGS: Eight spot images from the C-arm fluoroscopic device, AP and lateral views of the right tibia and fibula obtained at various points during the ORIF procedure are submitted for interpretation postoperatively. The images obtained at upon completion of the procedure demonstrated ORIF of the comminuted tibial plateau fracture extending into the metaphysis and diaphysis with plate and screw fixation. Alignment is much improved after ORIF. The radiologic technologist documented 1 minute and 43 seconds of fluoroscopy time.  IMPRESSION: Images obtained during ORIF of the comminuted right tibial plateau fracture. Electronically Signed   By: Evangeline Dakin M.D.   On: 03/04/2016 14:59   Dg Knee Right Port  Result Date: 03/04/2016 CLINICAL DATA:  Status post ORIF of tibial plateau fracture EXAM: PORTABLE RIGHT KNEE - 1-2 VIEW COMPARISON:  9/22/ 17 FINDINGS: Interval open reduction and internal fixation of the comminuted fracture deformity  involving the proximal tibia with extension of the fracture into the joint space. Screw and plate fixation has been performed. The hardware components and the fracture fragments are in anatomic alignment. IMPRESSION: 1. Status post ORIF of comminuted proximal tibial fracture. Electronically Signed   By: Kerby Moors M.D.   On: 03/04/2016 22:42   Dg C-arm Gt 120 Min  Result Date: 03/04/2016 CLINICAL DATA:  Removal of right leg external fixation hardware EXAM: DG C-ARM GT 120 MIN CONTRAST:  None FLUOROSCOPY TIME:  Fluoroscopy Time:  1 minutes and 43 seconds Radiation Exposure Index (if provided by the fluoroscopic device): Not provided Number of Acquired Spot Images: 8 COMPARISON:  Fluoroscopy 02/27/2016 FINDINGS: Fluoroscopic images were obtained during the internal fixation of a comminuted fracture of the right tibial plateau. IMPRESSION: Intraoperative fluoroscopy of internal fixation of right tibial fracture. Electronically Signed   By: Ulyses Jarred M.D.   On: 03/04/2016 14:58   Dg Hip Operative Unilat With Pelvis Left  Result Date: 03/04/2016 CLINICAL DATA:  Removal of external fixation with open reduction internal fixation of tibial plateau fracture EXAM: OPERATIVE right HIP and knee (WITH PELVIS IF PERFORMED) 8 VIEWS TECHNIQUE: Fluoroscopic spot image(s) were submitted for interpretation post-operatively. COMPARISON:  Right knee films of 02/27/2016 FINDINGS: External fixation wires have been removed. Plate and screw fixation has been performed of the comminuted proximal right tibial  fracture involving much of the medial plateau. Near anatomic position and alignment is maintained on the C-arm images returned. IMPRESSION: ORIF of comminuted proximal right tibial fracture. Electronically Signed   By: Ivar Drape M.D.   On: 03/04/2016 15:03   Dg Femur Port Min 2 Views Left  Result Date: 03/03/2016 CLINICAL DATA:  Preoperative evaluation prior to autograft harvesting from the left femur. EXAM: LEFT FEMUR PORTABLE 2 VIEWS COMPARISON:  None. FINDINGS: No intrinsic osseous abnormalities. Well-preserved cortex. Visualized hip joint and knee joint intact. IMPRESSION: Normal examination. Electronically Signed   By: Evangeline Dakin M.D.   On: 03/03/2016 18:16     Medications:  Scheduled: . acetaminophen  1,000 mg Oral Q6H  . calcium citrate  200 mg of elemental calcium Oral BID  .  ceFAZolin (ANCEF) IV  1 g Intravenous Q8H  . cholecalciferol  2,000 Units Oral BID  . docusate sodium  100 mg Oral BID  . enoxaparin (LOVENOX) injection  40 mg Subcutaneous Q24H  . insulin aspart  0-15 Units Subcutaneous TID WC  . insulin aspart  5 Units Subcutaneous TID AC  . insulin glargine  15 Units Subcutaneous QHS  . polyethylene glycol  17 g Oral BID  . senna  1 tablet Oral Daily  . vitamin C  500 mg Oral Daily   Continuous: . sodium chloride 125 mL/hr at 03/05/16 0558  . lactated ringers 10 mL/hr at 03/04/16 0734   [id number], HYDROmorphone (DILAUDID) injection, methocarbamol **OR** methocarbamol (ROBAXIN)  IV, metoCLOPramide **OR** metoCLOPramide (REGLAN) injection, ondansetron **OR** ondansetron (ZOFRAN) IV, oxyCODONE, sodium chloride  Assessment/Plan:  Active Problems:   Tibial plateau fracture   Diabetes (HCC)    Fever of unknown origin Had fever of 102.1 last night, unclear source of fever, does not have leukocytosis does not look toxic. Blood cultures were done last time he had fever, cultures, urine cultures repeated. It happens that patient has fever every time he  goes to surgery. This could be related to atelectasis, x-ray and urinalysis without acute findings.  Poorly controlled diabetes mellitus type II. HbA1c is 12.2 , implying very poor Glycemic  control, this is correlate with mean plasma glucose of 303. Started on Lantus insulin adjusted to 15 units daily at bedtime. Currently on 5 units of NovoLog with meals, we will evaluate for prandial insulin versus metformin only on discharge.  Hyponatremia and hypokalemia Potassium is improved. This is resolved.  Constipation. Secondary to narcotics. We'll give MiraLAX and milk of magnesia.  Tibial plateau fracture Management per orthopedics.  Thank you for this consult. I will continue to this follow patient with you.    LOS: 7 days   [facility] A  [facility] Pager [phone] 03/05/2016, 11:33 AM  If 7PM-7AM, please contact night-coverage at [email or web address], password [facility]

## 2016-03-05 NOTE — Progress Notes (Signed)
RN called for a second opinion for oral temp 102 and HR 120's. RN had placed a call to [facility] NP and Dr. Marcelino Scot PTA, new orders given and completed. Upon arrival pt alert and oriented, lungs clear through out, additional ice pack placed at groin for cooling measures. Pt states "I'm always hot." Temp 99.7 after PO Tylenol 650 mg HR 114

## 2016-03-05 NOTE — Progress Notes (Signed)
Orthopedic Tech Progress Note Patient Details:  Lawrence [name] 1966/02/05 [id number]  Ortho Devices Type of Ortho Device: Ace wrap, Doran Durand splint Ortho Device/Splint Location: foot roll Ortho Device/Splint Interventions: Application   [name] 03/05/2016, 4:59 PM

## 2016-03-05 NOTE — Progress Notes (Signed)
[facility] Progress Note  Subjective  Doing well  Pain controlled this am in R leg Not having any pain in L hip  No CP or SOB No numbness or tingling   ROS As above  Objective   BP (!) 155/82 (BP Location: Left Arm)   Pulse (!) 111   Temp 99 F (37.2 C) (Oral)   Resp 16   SpO2 100%   Intake/Output      09/28 0701 - 09/29 0700 09/29 0701 - 09/30 0700   P.O.     I.V. 4075    IV Piggyback 0    Total Intake 4075     Urine 5925    Blood 400    Total Output 6325     Net -2250            Labs  Results for TYRIEK, SEDANO (MRN [id number]) as of 03/05/2016 09:31  Ref. Range 03/02/2016 04:00  Vit D, 1,25-Dihydroxy Latest Ref Range: 19.9 - 79.3 pg/mL 67.0   Cbc pending   Exam  Gen: resting comfortably in bed, NAD Lungs: clear anterior fields Cardiac: tachy but regular  Abd: + BS, NTND Ext:       Right Lower Extremity   Dressing c/d/i  Ext warm  + DP pulse  DPN, SPN, TN sensation grossly intact  EHL, FHL, AT, PT, peroneals, gastroc motor intact   Swelling stable        Left Lower Extremity   Dressing L hip c/d/i    Assessment and Plan   POD/HD#: 1  50 y/o male s/p fall off ladder (12ft) with complex bicondylar R tibial plateau fracture    - Fall of ladder   - R bicondylar tibial plateau fx with severe lateral joint depression             S/P  ORIF and RIA harvest from L femur   NWB Right Lower Extremity    WBAT Left leg   Unrestricted ROM R knee   Hinged knee brace unlocked   Can be out of hinge for ROM   Brace only needs to be on for mobilization    Ice and elevate  No pillows under R knee at rest- will order bone foam    - Pain management:      Continue with current regimen              Tylenol 1000 mg po q6h scheduled             Oxy IR 5-10 mg po q3h prn pain             Robaxin 500 mg po q6h prn spasms              Dilaudid 1mg  IV q2h prn severe pain    - ABL anemia/Hemodynamics  Pt is tachy but no CP or SOB      cbc pending  Pt is jehovah's witness so no blood products   Continue with IVF     - Medical issues               DM                         Appreciate medicine assistance with management  Critical sugars remain in check to prevent complications                Fever                         fever post op last night   Afebrile this am   U/a and culture pending   CXR pending   Suspect fever related to ATX. 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             Vitamin d deficiency              Testosterone deficiency    - Dispo:             therapy evals  Continue with tight sugar control  Anticipate dc home on Monday 03/08/2016     Jari Pigg, PA-C [facility] [phone] (O) 03/05/2016 9:32 AM

## 2016-03-05 NOTE — Progress Notes (Signed)
Physical Therapy Treatment Patient Details Name: Lawrence [name] MRN: [id number] DOB: [birth year] Today's Date: 03/05/2016    History of Present Illness Pt is a 50 y/o male admitted after falling from his ladder, sustaining a tibial plateau fx. Pt now with ex fix on L LE. No pertinent PMH.    PT Comments    Pt limited due to dizziness.  Pt diaphoretic during tx.  Pt will need stair training and review of WC parts before d/c.  Will continue to progress patient as appropriate.    Follow Up Recommendations  Home health PT;Supervision for mobility/OOB     Equipment Recommendations  Rolling walker with 5" wheels;3in1 (PT);Wheelchair (measurements PT);Wheelchair cushion (measurements PT)    Recommendations for Other Services       Precautions / Restrictions Precautions Precautions: Fall Precaution Comments: Pt with ORIF now, external fixator removed 9/28 Required Braces or Orthoses: Other Brace/Splint (bledsoe brace on at all times.  RLE.) Restrictions Weight Bearing Restrictions: Yes RLE Weight Bearing: Non weight bearing    Mobility  Bed Mobility Overal bed mobility: Needs Assistance Bed Mobility: Supine to Sit     Supine to sit: Min assist     General bed mobility comments: Requires assist with RLE.  Cues for hand placement and trunk control.  Heavy use of arms with trapeze  Transfers Overall transfer level: Needs assistance Equipment used: Rolling walker (2 wheeled) Transfers: Sit to/from Stand Sit to Stand: Min guard Stand pivot transfers: Min guard Squat pivot transfers: Min guard     General transfer comment: Cues for hand placement and body position in prep for transfers.  Pt does a good job maintaining R NWB.    Ambulation/Gait Ambulation/Gait assistance: Min guard Ambulation Distance (Feet): 10 Feet (+12 ft, limited gait due to dizziness) Assistive device: Rolling walker (2 wheeled) Gait Pattern/deviations: Step-to pattern;Antalgic;Trunk flexed   Gait  velocity interpretation: Below normal speed for age/gender General Gait Details: Steps to chair for stand pivot, deferred gait for WC mobility.     Stairs            Information systems manager mobility:  (deferred to family to perform, OT to assist patient with adaptive equipment.  )  Modified Rankin (Stroke Patients Only)       Balance Overall balance assessment: Needs assistance   Sitting balance-Leahy Scale: Fair       Standing balance-Leahy Scale: Poor                      Cognition Arousal/Alertness: Awake/alert Behavior During Therapy: WFL for tasks assessed/performed Overall Cognitive Status: Within Functional Limits for tasks assessed                      Exercises      General Comments        Pertinent Vitals/Pain Pain Assessment: 0-10 Pain Score: 9  Pain Location: R LE Pain Descriptors / Indicators: Tightness;Throbbing;Sore Pain Intervention(s): Monitored during session;Repositioned    Home Living                      Prior Function            PT Goals (current goals can now be found in the care plan section) Acute Rehab PT Goals Patient Stated Goal: return home Potential to Achieve Goals: Good Progress towards PT goals: Progressing toward goals    Frequency    Min 5X/week      PT  Plan Current plan remains appropriate    Co-evaluation PT/OT/SLP Co-Evaluation/Treatment: Yes Reason for Co-Treatment: For patient/therapist safety (Pt dizzy)         End of Session Equipment Utilized During Treatment: Gait belt Activity Tolerance: Patient limited by pain;Patient limited by fatigue Patient left: in chair;with call bell/phone within reach;with family/visitor present     Time: 1525-1550 PT Time Calculation (min) (ACUTE ONLY): 25 min  Charges:  $Therapeutic Activity: 8-22 mins                    G Codes:      Cristela Blue 2016-03-20, 4:04 PM  Governor Rooks, PTA pager  [phone]

## 2016-03-05 NOTE — Care Management Note (Addendum)
Case Management Note  Patient Details  Name: Lawrence [name] MRN: [id number] Date of Birth: 12/08/1965  Subjective/Objective:   Fall from ladder with complex bicondylar right tibial plateau fracture, s/p ORIF and RIA harvest from L femur                 Action/Plan: Discharge Planning:  NCM spoke to dtr, Verdis Frederickson # [phone] and wife. Pt has wheelchair and RW in room. Requesting 3n1 bedside commode for home. Will order from [facility] on 03/06/2016. Offered choice for [facility]. Dtr states HH arranged with [facility].    Expected Discharge Date:                  Expected Discharge Plan:  [place]  In-House Referral:  NA  Discharge planning Services  CM Consult  Post Acute Care Choice:  Home Health Choice offered to:  Adult Children  DME Arranged:  3-N-1, Wheelchair manual, Walker rolling DME Agency:  [facility] Arranged:  PT, OT [place] Agency:  [facility]  Status of Service:  Completed, signed off  If discussed at [place] of Stay Meetings, dates discussed:    Additional Comments:  Erenest Rasher, RN 03/05/2016, 6:36 PM

## 2016-03-05 NOTE — Progress Notes (Signed)
Occupational Therapy Treatment Patient Details Name: Lawrence [name] MRN: [id number] DOB: 1965-08-17 Today's Date: 03/05/2016    History of present illness Pt is a 50 y/o male admitted after falling from his ladder, sustaining a tibial plateau fx. Pt now with ex fix on L LE. No pertinent PMH.   OT comments  Pt is progressing well with Ot. He performed toilet transfer with min guard assist to [facility] this date. He was able to tolerate standing grooming tasks for approximately 5 minutes to brush teeth. Continued education with family and pt concerning use of AE for LB ADLs. Pt required min assist with donning/doffing socks and simulated threading feet into pants with ADL. Plan to address tub transfer with 3-in-1 next visit per pt request. Will continue to follow acutely. D/C recommendations remain appropriate.   Follow Up Recommendations  Home health OT;Supervision/Assistance - 24 hour    Equipment Recommendations  3 in 1 bedside comode    Recommendations for Other Services      Precautions / Restrictions Precautions Precautions: Fall Precaution Comments: Pt with ORIF now, external fixator removed 9/28 Required Braces or Orthoses: Other Brace/Splint Other Brace/Splint: Bledsoe brace on RLE at all times Restrictions Weight Bearing Restrictions: Yes RLE Weight Bearing: Non weight bearing       Mobility Bed Mobility Overal bed mobility: Needs Assistance Bed Mobility: Supine to Sit     Supine to sit: Min assist     General bed mobility comments: OOB when OT arrived.  Transfers Overall transfer level: Needs assistance Equipment used: Rolling walker (2 wheeled) Transfers: Sit to/from Stand Sit to Stand: Min guard Stand pivot transfers: Min guard Squat pivot transfers: Min guard     General transfer comment: Cues for hand placement and body position in prep for transfers.  Pt does a good job maintaining R NWB.      Balance Overall balance assessment: Needs  assistance Sitting-balance support: Feet supported;No upper extremity supported Sitting balance-Leahy Scale: Fair     Standing balance support: During functional activity;Single extremity supported Standing balance-Leahy Scale: Poor                     ADL Overall ADL's : Needs assistance/impaired     Grooming: Oral care;Sitting;Standing;Min guard Grooming Details (indicate cue type and reason): Pt ambulated to sink with RW, sat to brush teeth, but stood to rinse mouth and wash hands.             Lower Body Dressing: Minimal assistance;With adaptive equipment (Sitting) Lower Body Dressing Details (indicate cue type and reason): Pt utilized AE to don/soff socks and simulate donning pants. Toilet Transfer: +2 for safety/equipment;Stand-pivot;RW;Min guard [employer] forces technical officer Details (indicate cue type and reason): ambulated to [facility] in bathroom       Tub/Shower Transfer Details (indicate cue type and reason): Pt requested to defer until next session secondary to dizziness and previous mobility to [facility]. Functional mobility during ADLs: Minimal assistance;+2 for safety/equipment;Rolling walker General ADL Comments: Pain is subsiding and pt able to complete ADL transfers with min assist +2. Improved ability to adhere to weight bearing status.      Vision                     Perception     Praxis      Cognition   Behavior During Therapy: [facility] for tasks assessed/performed Overall Cognitive Status: Within Functional Limits for tasks assessed  Extremity/Trunk Assessment               Exercises     Shoulder Instructions       General Comments      Pertinent Vitals/ Pain       Pain Assessment: Faces Pain Score: 9  Faces Pain Scale: Hurts little more Pain Location: RLE Pain Descriptors / Indicators: Sore;Tightness Pain Intervention(s): Monitored during session;Repositioned;Limited activity within patient's tolerance  Home  Living                                          Prior Functioning/Environment              Frequency  Min 3X/week        Progress Toward Goals  OT Goals(current goals can now be found in the care plan section)  Progress towards OT goals: Progressing toward goals  Acute Rehab OT Goals Patient Stated Goal: return home OT Goal Formulation: With patient/family Time For Goal Achievement: 03/13/16 Potential to Achieve Goals: Good ADL Goals Pt Will Perform Grooming: with supervision;standing Pt Will Perform Upper Body Bathing: with modified independence;sitting Pt Will Perform Lower Body Bathing: with supervision;sit to/from stand Pt Will Transfer to Toilet: with supervision;ambulating;bedside commode Pt Will Perform Toileting - Clothing Manipulation and hygiene: with modified independence;sit to/from stand Pt Will Perform Tub/Shower Transfer: Tub transfer;with min assist;with caregiver independent in assisting;3 in 1;ambulating;rolling walker  Plan Discharge plan remains appropriate    Co-evaluation    PT/OT/SLP Co-Evaluation/Treatment: Yes Reason for Co-Treatment: For patient/therapist safety (Pt dizzy) PT goals addressed during session: Mobility/safety with mobility;Proper use of DME OT goals addressed during session: ADL's and self-care;Proper use of Adaptive equipment and DME      End of Session Equipment Utilized During Treatment: Gait belt;Rolling walker   Activity Tolerance Patient tolerated treatment well   Patient Left with family/visitor present;Other (comment) (Family taking pt on walk in hallway with nurse permission)   Nurse Communication          Time: [phone] OT Time Calculation (min): 35 min  Charges: OT General Charges $OT Visit: 1 Procedure OT Treatments $Self Care/Home Management : 8-22 mins  Norman Herrlich, OTR/L [phone] 03/05/2016, 4:43 PM

## 2016-03-06 DIAGNOSIS — R5082 Postprocedural fever: Secondary | ICD-10-CM

## 2016-03-06 LAB — CULTURE, BLOOD (ROUTINE X 2)
Culture: NO GROWTH
Culture: NO GROWTH

## 2016-03-06 LAB — GLUCOSE, CAPILLARY
Glucose-Capillary: 157 mg/dL — ABNORMAL HIGH (ref 65–99)
Glucose-Capillary: 146 mg/dL — ABNORMAL HIGH (ref 65–99)
Glucose-Capillary: 160 mg/dL — ABNORMAL HIGH (ref 65–99)
Glucose-Capillary: 145 mg/dL — ABNORMAL HIGH (ref 65–99)

## 2016-03-06 LAB — CBC
HCT: 22.2 % — ABNORMAL LOW (ref 39.0–52.0)
Hemoglobin: 7.4 g/dL — ABNORMAL LOW (ref 13.0–17.0)
MCH: 30.1 pg (ref 26.0–34.0)
MCHC: 33.3 g/dL (ref 30.0–36.0)
MCV: 90.2 fL (ref 78.0–100.0)
Platelets: 214 10*3/uL (ref 150–400)
RBC: 2.46 MIL/uL — ABNORMAL LOW (ref 4.22–5.81)
RDW: 12.2 % (ref 11.5–15.5)
WBC: 8.2 10*3/uL (ref 4.0–10.5)

## 2016-03-06 LAB — URINE CULTURE: Culture: NO GROWTH

## 2016-03-06 MED ORDER — INSULIN GLARGINE 100 UNIT/ML ~~LOC~~ SOLN
18.0000 [IU] | Freq: Every day | SUBCUTANEOUS | Status: DC
  Administered 2016-03-06: 9 [IU] via SUBCUTANEOUS
  Administered 2016-03-07: 10 [IU] via SUBCUTANEOUS
  Filled 2016-03-06 (×3): qty 0.18

## 2016-03-06 MED ORDER — DEXTROSE 50 % IV SOLN
INTRAVENOUS | Status: AC
  Filled 2016-03-06: qty 50

## 2016-03-06 MED ORDER — MAGNESIUM HYDROXIDE 400 MG/5ML PO SUSP
30.0000 mL | Freq: Once | ORAL | Status: AC
  Administered 2016-03-06: 30 mL via ORAL
  Filled 2016-03-06: qty 30

## 2016-03-06 MED ORDER — INSULIN ASPART 100 UNIT/ML ~~LOC~~ SOLN
6.0000 [IU] | Freq: Three times a day (TID) | SUBCUTANEOUS | Status: DC
  Administered 2016-03-06 – 2016-03-08 (×8): 6 [IU] via SUBCUTANEOUS

## 2016-03-06 NOTE — Procedures (Signed)
Pt does not wish to wear cpap will inform RT if anything changes. 

## 2016-03-06 NOTE — Progress Notes (Signed)
Subjective: 2 Days Post-Op Procedure(s) (LRB): REMOVAL EXTERNAL FIXATION LEG (Right) OPEN REDUCTION INTERNAL FIXATION (ORIF) TIBIAL PLATEAU (Right) Rimmed IM aspirate (Left) Patient reports pain as mild and moderate.    Objective: Vital signs in last 24 hours: Temp:  [98.6 F (37 C)-101.1 F (38.4 C)] 98.9 F (37.2 C) (09/30 0434) Pulse Rate:  [58-121] 95 (09/30 0434) Resp:  [16] 16 (09/29 1256) BP: (121-147)/(67-77) 121/68 (09/30 0434) SpO2:  [98 %-100 %] 100 % (09/30 0434)  Intake/Output from previous day: 09/29 0701 - 09/30 0700 In: 340 [P.O.:240; IV Piggyback:100] Out: 1150 [Urine:1150] Intake/Output this shift: No intake/output data recorded.   Recent Labs  03/04/16 0424 03/04/16 2047 03/05/16 1007 03/06/16 0543  HGB 10.9* 8.4* 8.2* 7.4*    Recent Labs  03/05/16 1007 03/06/16 0543  WBC 8.3 8.2  RBC 2.80* 2.46*  HCT 25.0* 22.2*  PLT 212 214    Recent Labs  03/04/16 0424 03/05/16 0605  NA 135  --   K 3.9  --   CL 97*  --   CO2 28  --   BUN 7  --   CREATININE 0.60* 0.58*  GLUCOSE 181*  --   CALCIUM 8.6*  --     Recent Labs  03/04/16 0424  INR 0.98    Neurovascular intact Sensation intact distally Intact pulses distally Dorsiflexion/Plantar flexion intact Incision: dressing C/D/I Compartment soft  Assessment/Plan: 2 Days Post-Op Procedure(s) (LRB): REMOVAL EXTERNAL FIXATION LEG (Right) OPEN REDUCTION INTERNAL FIXATION (ORIF) TIBIAL PLATEAU (Right) Rimmed IM aspirate (Left)  50 y/o male s/p fall off ladder (71ft) with complex bicondylar R tibial plateau fracture   - Fall of ladder  - R bicondylar tibial plateau fx with severe lateral joint depression S/P  ORIF and RIA harvest from L femur              NWB Right Lower Extremity              WBAT Left leg              Unrestricted ROM R knee                         Hinged knee brace unlocked                         Can be out of hinge for ROM     Brace only needs to be on for mobilization                          Ice and elevate             No pillows under R knee at rest- will order bone foam               - Pain management:      Continue with current regimen  Tylenol 1000 mg po q6h scheduled Oxy IR 5-10 mg po q3h prn pain Robaxin 500 mg po q6h prn spasms  Dilaudid 1mg  IV q2h prn severe pain   - ABL anemia/Hemodynamics             Pt is tachy but no CP or SOB cbc Hgb this AM 7.4             Pt is jehovah's witness so no blood products              Continue with IVF    -  Medical issues  DM Appreciate medicine assistance with management  Critical sugars remain in check to prevent complications   Fever fever post op last night                         Afebrile this am                         U/a and culture pending                         CXR pending                         Suspect fever related to ATX. Pt has essentially been on bed rest for 5 days prior to surgery                          He did start using is IS aggressively last night after we were made aware of fever                           - DVT/PE prophylaxis: Lovenox x21 days post op   - ID:  [email or web address]    - Metabolic Bone Disease: vitamin d deficiency, testosterone deficiency/hypogodadal osteoporosis- likely related to uncontrolled DM Will need vitamin d supplementation, calcium supplementation Recheck testosterone levels once vitamin D corrected    Anticipate starting forteo therapy post-op Will absolutely need DEXA as outpt   - Activity: NWB R leg   - Impediments to fracture healing: DIABETES!! Vitamin d deficiency  Testosterone deficiency   -  Dispo: therapy evals             Continue with tight sugar control             Anticipate dc home on Monday 03/08/2016  Chriss Czar 03/06/2016, 12:11 PM

## 2016-03-06 NOTE — Progress Notes (Signed)
[facility] PROGRESS NOTE  Lawrence [name] [id number] DOB: 08-29-1965 DOA: 02/27/2016  PCP: No primary care provider on file.   Reason for visit: Uncontrolled diabetes mellitus   Brief History/Interval Summary: 50 year old male of Hispanic origin who has a past medical history of poorly controlled diabetes who was admitted on September 22 after sustaining injury as a result of a fall from a ladder onto concrete. He was found to have communicated displaced right tibial plateau fracture. Reconstruction is planned for Tuesday, September 26. Medicine was consulted to assist with management of diabetes.  Procedures: 3 surgeries for comminuted right tibial plateau fracture.  Subjective/Interval History: Seen with his daughter and wife at bedside, denies any complaint, blood sugar still elevated will increase Lantus to 18 and NovoLog to 6.  ROS: Denies any nausea or vomiting.  Objective:  Vital Signs  Vitals:   03/05/16 1256 03/05/16 1935 03/06/16 0007 03/06/16 0434  BP: 129/76 (!) 147/77 122/67 121/68  Pulse: (!) 106 (!) 121 (!) 58 95  Resp: 16     Temp: (!) 101.1 F (38.4 C) 98.6 F (37 C) 99.4 F (37.4 C) 98.9 F (37.2 C)  TempSrc: Oral Oral Oral Oral  SpO2: 100% 98% 100% 100%    Intake/Output Summary (Last 24 hours) at 03/06/16 0951 Last data filed at 03/06/16 [id number]  Gross per 24 hour  Intake              100 ml  Output             1150 ml  Net            -1050 ml   There were no vitals filed for this visit.  General appearance: alert, cooperative, appears stated age, no distress and moderately obese Resp: clear to auscultation bilaterally Cardio: regular rate and rhythm, S1, S2 normal, no murmur, click, rub or gallop GI: soft, non-tender; bowel sounds normal; no masses,  no organomegaly Extremities: Right lower extremity is immobilized.  Lab Results:  Data Reviewed: I have personally reviewed following labs and imaging studies  CBC:  Recent Labs Lab  03/02/16 0400 03/04/16 0424 03/04/16 2047 03/05/16 1007 03/06/16 0543  WBC 8.0 6.5 7.8 8.3 8.2  NEUTROABS 5.1  --   --   --   --   HGB 10.8* 10.9* 8.4* 8.2* 7.4*  HCT 31.2* 32.1* 25.8* 25.0* 22.2*  MCV 87.6 89.2 89.3 89.3 90.2  PLT 181 237 221 212 [id number]    Basic Metabolic Panel:  Recent Labs Lab 03/01/16 0512 03/02/16 0400 03/04/16 0424 03/05/16 0605  NA 131* 133* 135  --   K 3.4* 3.8 3.9  --   CL 98* 99* 97*  --   CO2 27 27 28   --   GLUCOSE 122* 187* 181*  --   BUN 10 10 7   --   CREATININE 0.58* 0.53* 0.60* 0.58*  CALCIUM 8.0* 8.6*  8.6* 8.6*  --   MG  --  1.9  --   --   PHOS  --  3.8  --   --     GFR: CrCl cannot be calculated (Unknown ideal weight.).  Coagulation Profile:  Recent Labs Lab 03/04/16 0424  INR 0.98    HbA1C: No results for input(s): HGBA1C in the last 72 hours.  CBG:  Recent Labs Lab 03/05/16 1054 03/05/16 1652 03/05/16 1940 03/05/16 2104 03/06/16 0723  GLUCAP 218* 102* 165* 198* 157*     Recent Results (from the past 240 hour(s))  Surgical pcr screen     Status: None   Collection Time: 02/27/16  5:57 PM  Result Value Ref Range Status   MRSA, PCR NEGATIVE NEGATIVE Final   Staphylococcus aureus NEGATIVE NEGATIVE Final    Comment:        The Xpert SA Assay (FDA approved for NASAL specimens in patients over 77 years of age), is one component of a comprehensive surveillance program.  Test performance has been validated by [facility] for patients greater than or equal to 87 year old. It is not intended to diagnose infection nor to guide or monitor treatment.   Culture, blood (Routine X 2) w Reflex to ID Panel     Status: None (Preliminary result)   Collection Time: 03/01/16  9:49 AM  Result Value Ref Range Status   Specimen Description BLOOD LEFT ARM  Final   Special Requests BOTTLES DRAWN AEROBIC AND ANAEROBIC 8CC EACH  Final   Culture NO GROWTH 4 DAYS  Final   Report Status PENDING  Incomplete  Culture, blood  (Routine X 2) w Reflex to ID Panel     Status: None (Preliminary result)   Collection Time: 03/01/16  9:53 AM  Result Value Ref Range Status   Specimen Description BLOOD RIGHT ARM  Final   Special Requests BOTTLES DRAWN AEROBIC AND ANAEROBIC 10CC EACH  Final   Culture NO GROWTH 4 DAYS  Final   Report Status PENDING  Incomplete  Urine culture     Status: None   Collection Time: 03/02/16  1:15 AM  Result Value Ref Range Status   Specimen Description URINE, CLEAN CATCH  Final   Special Requests NONE  Final   Culture NO GROWTH  Final   Report Status 03/03/2016 FINAL  Final      Radiology Studies: Dg Chest 2 View  Result Date: 03/05/2016 CLINICAL DATA:  Postoperative fever. EXAM: CHEST  2 VIEW COMPARISON:  03/01/2016 FINDINGS: Few linear densities at the right lung base may represent atelectasis. Otherwise, the lungs are clear. No pulmonary edema. Heart and mediastinum are within normal limits. The trachea is midline. Negative for a pneumothorax. No large pleural effusions. No acute bone abnormality. IMPRESSION: Minimal atelectasis at the right lung base. Electronically Signed   By: Markus Daft M.D.   On: 03/05/2016 09:58   Dg Tibia/fibula Right  Result Date: 03/04/2016 CLINICAL DATA:  ORIF comminuted right tibial plateau fracture. EXAM: RIGHT TIBIA AND FIBULA - 2 VIEW COMPARISON:  CT of the right tibia and fibula 02/27/2016. Right tibia fibula x-rays of that same date. FINDINGS: Eight spot images from the C-arm fluoroscopic device, AP and lateral views of the right tibia and fibula obtained at various points during the ORIF procedure are submitted for interpretation postoperatively. The images obtained at upon completion of the procedure demonstrated ORIF of the comminuted tibial plateau fracture extending into the metaphysis and diaphysis with plate and screw fixation. Alignment is much improved after ORIF. The radiologic technologist documented 1 minute and 43 seconds of fluoroscopy time.  IMPRESSION: Images obtained during ORIF of the comminuted right tibial plateau fracture. Electronically Signed   By: Evangeline Dakin M.D.   On: 03/04/2016 14:59   Dg Knee Right Port  Result Date: 03/04/2016 CLINICAL DATA:  Status post ORIF of tibial plateau fracture EXAM: PORTABLE RIGHT KNEE - 1-2 VIEW COMPARISON:  9/22/ 17 FINDINGS: Interval open reduction and internal fixation of the comminuted fracture deformity involving the proximal tibia with extension of the fracture into the joint space.  Screw and plate fixation has been performed. The hardware components and the fracture fragments are in anatomic alignment. IMPRESSION: 1. Status post ORIF of comminuted proximal tibial fracture. Electronically Signed   By: Kerby Moors M.D.   On: 03/04/2016 22:42   Dg C-arm Gt 120 Min  Result Date: 03/04/2016 CLINICAL DATA:  Removal of right leg external fixation hardware EXAM: DG C-ARM GT 120 MIN CONTRAST:  None FLUOROSCOPY TIME:  Fluoroscopy Time:  1 minutes and 43 seconds Radiation Exposure Index (if provided by the fluoroscopic device): Not provided Number of Acquired Spot Images: 8 COMPARISON:  Fluoroscopy 02/27/2016 FINDINGS: Fluoroscopic images were obtained during the internal fixation of a comminuted fracture of the right tibial plateau. IMPRESSION: Intraoperative fluoroscopy of internal fixation of right tibial fracture. Electronically Signed   By: Ulyses Jarred M.D.   On: 03/04/2016 14:58   Dg Hip Operative Unilat With Pelvis Left  Result Date: 03/04/2016 CLINICAL DATA:  Removal of external fixation with open reduction internal fixation of tibial plateau fracture EXAM: OPERATIVE right HIP and knee (WITH PELVIS IF PERFORMED) 8 VIEWS TECHNIQUE: Fluoroscopic spot image(s) were submitted for interpretation post-operatively. COMPARISON:  Right knee films of 02/27/2016 FINDINGS: External fixation wires have been removed. Plate and screw fixation has been performed of the comminuted proximal right tibial  fracture involving much of the medial plateau. Near anatomic position and alignment is maintained on the C-arm images returned. IMPRESSION: ORIF of comminuted proximal right tibial fracture. Electronically Signed   By: Ivar Drape M.D.   On: 03/04/2016 15:03     Medications:  Scheduled: . acetaminophen  1,000 mg Oral Q6H  . calcium citrate  200 mg of elemental calcium Oral BID  . cholecalciferol  2,000 Units Oral BID  . docusate sodium  100 mg Oral BID  . enoxaparin (LOVENOX) injection  40 mg Subcutaneous Q24H  . insulin aspart  0-15 Units Subcutaneous TID WC  . insulin aspart  5 Units Subcutaneous TID AC  . insulin glargine  15 Units Subcutaneous QHS  . polyethylene glycol  17 g Oral BID  . senna  1 tablet Oral Daily  . vitamin C  500 mg Oral Daily   Continuous:   [id number], HYDROmorphone (DILAUDID) injection, methocarbamol **OR** methocarbamol (ROBAXIN)  IV, metoCLOPramide **OR** metoCLOPramide (REGLAN) injection, ondansetron **OR** ondansetron (ZOFRAN) IV, oxyCODONE, sodium chloride  Assessment/Plan:  Principal Problem:   Tibial plateau fracture Active Problems:   Diabetes (HCC)   Fever    Fever of unknown origin Had fever of 102.1 last night, unclear source of fever, does not have leukocytosis does not look toxic. Blood cultures were done last time he had fever, cultures, urine cultures repeated. It happens that patient has fever every time he goes to surgery. This could be related to atelectasis, x-ray and urinalysis without acute findings.Follow closely.  Anemia -Hemoglobin of 7.4, this is likely perioperative loss. If continued to drop consider transfusion.  Poorly controlled diabetes mellitus type II. HbA1c is 12.2 , implying very poor Glycemic control, this is correlate with mean plasma glucose of 303. Started on Lantus insulin adjusted to 18 units daily at bedtime. Currently on 6 units of NovoLog with meals, we will evaluate for prandial insulin versus  metformin only on discharge.  Hyponatremia and hypokalemia Potassium is improved. This is resolved.  Constipation. Secondary to narcotics. We'll give MiraLAX and milk of magnesia.  Tibial plateau fracture Management per orthopedics.  Thank you for this consult. I will continue to this follow patient with you.  LOS: 8 days   [facility] Pager [phone] 03/06/2016, 9:51 AM  If 7PM-7AM, please contact night-coverage at [email or web address], password [facility]

## 2016-03-06 NOTE — Progress Notes (Signed)
Physical Therapy Treatment Patient Details Name: Lawrence [name] MRN: [id number] DOB: [birth year] Today's Date: 03/06/2016    History of Present Illness Pt is a 50 y/o male admitted after falling from his ladder, sustaining a tibial plateau fx. Pt now with ex fix on L LE. No pertinent PMH.    PT Comments    Pt presented supine in bed with HOB elevated, awake and willing to participate in therapy session. Pt's spouse and children were present throughout session. Pt again limited secondary to pain and c/o dizziness during ambulation this session. PT demonstrated and instructed pt in HEP for R LE including AROM, AAROM and PROM of R knee and ankle. PT gave pt handout of HEP as well. Pt would continue to benefit from skilled physical therapy services at this time while admitted and after d/c to address his limitations in order to improve his overall safety and independence with functional mobility. PT planning to perform stair training at next session. Pt declined stair training this session secondary to dizziness.   Follow Up Recommendations  Home health PT;Supervision for mobility/OOB     Equipment Recommendations  Rolling walker with 5" wheels;3in1 (PT);Wheelchair (measurements PT);Wheelchair cushion (measurements PT)    Recommendations for Other Services       Precautions / Restrictions Precautions Precautions: Fall Precaution Comments: Pt with ORIF now, external fixator removed 9/28 Required Braces or Orthoses: Other Brace/Splint Other Brace/Splint: Bledsoe brace on RLE at all times Restrictions Weight Bearing Restrictions: Yes RLE Weight Bearing: Non weight bearing    Mobility  Bed Mobility Overal bed mobility: Needs Assistance Bed Mobility: Supine to Sit     Supine to sit: Min assist;HOB elevated     General bed mobility comments: pt required increased time, use of bed rails and trapeze bar and min A with R LE and upper body to achieve sitting EOB  Transfers Overall  transfer level: Needs assistance Equipment used: Rolling walker (2 wheeled) Transfers: Sit to/from [facility] Sit to Stand: Min assist Stand pivot transfers: Min assist       General transfer comment: pt required increased time and min A to achieve full standing position from sitting EOB and sitting in recliner  Ambulation/Gait Ambulation/Gait assistance: Min guard Ambulation Distance (Feet): 20 Feet (20' x2 with sitting rest break in between) Assistive device: Rolling walker (2 wheeled) Gait Pattern/deviations: Step-to pattern (hop-to pattern to maintain NWB R LE) Gait velocity: decreased Gait velocity interpretation: Below normal speed for age/gender General Gait Details: pt limited secondary to reports of dizziness and needing to sit down for the dizziness to resolve   Stairs            Wheelchair Mobility    Modified Rankin (Stroke Patients Only)       Balance Overall balance assessment: Needs assistance Sitting-balance support: Feet supported;No upper extremity supported Sitting balance-Leahy Scale: Fair     Standing balance support: During functional activity;Bilateral upper extremity supported Standing balance-Leahy Scale: Poor Standing balance comment: pt reliant on bilateral UEs on RW                    Cognition Arousal/Alertness: Awake/alert Behavior During Therapy: WFL for tasks assessed/performed Overall Cognitive Status: Within Functional Limits for tasks assessed                      Exercises Total Joint Exercises Ankle Circles/Pumps: AAROM;AROM;Right;10 reps;Seated Long Arc Quad: AAROM;Right;10 reps;Seated Knee Flexion: AAROM;Right;10 reps;Seated Other Exercises Other Exercises: R ankle  DF PROM stretch. PT demonstrated and instructed pt's family in performance of stretch as well    General Comments        Pertinent Vitals/Pain Pain Assessment: Faces Faces Pain Scale: Hurts even more Pain Location: R  LE Pain Descriptors / Indicators: Sore;Guarding;Grimacing;Moaning Pain Intervention(s): Monitored during session;Repositioned    Home Living                      Prior Function            PT Goals (current goals can now be found in the care plan section) Acute Rehab PT Goals Patient Stated Goal: return home PT Goal Formulation: With patient/family Time For Goal Achievement: 04-04-2016 Potential to Achieve Goals: Good Progress towards PT goals: Progressing toward goals    Frequency    Min 5X/week      PT Plan Current plan remains appropriate    Co-evaluation             End of Session Equipment Utilized During Treatment: Gait belt;Other (comment) (bledsoe brace on R LE) Activity Tolerance: Patient limited by pain;Patient limited by fatigue;Other (comment) (pt limited by dizziness with ambulation) Patient left: Other (comment);with family/visitor present (in w/c)     Time: [id number] PT Time Calculation (min) (ACUTE ONLY): 28 min  Charges:  $Gait Training: 8-22 mins $Therapeutic Activity: 8-22 mins                    G CodesClearnce Sorrel Alcon Apr 04, 2016, 11:27 AM Sherie Don, PT, DPT [phone]

## 2016-03-07 DIAGNOSIS — S82191A Other fracture of upper end of right tibia, initial encounter for closed fracture: Secondary | ICD-10-CM

## 2016-03-07 LAB — BASIC METABOLIC PANEL
Anion gap: 5 (ref 5–15)
BUN: <5 mg/dL — ABNORMAL LOW (ref 6–20)
CO2: 29 mmol/L (ref 22–32)
Calcium: 8 mg/dL — ABNORMAL LOW (ref 8.9–10.3)
Chloride: 99 mmol/L — ABNORMAL LOW (ref 101–111)
Creatinine, Ser: 0.56 mg/dL — ABNORMAL LOW (ref 0.61–1.24)
GFR calc Af Amer: >60 mL/min (ref >60)
GFR calc non Af Amer: >60 mL/min (ref >60)
Glucose, Bld: 162 mg/dL — ABNORMAL HIGH (ref 65–99)
Potassium: 3.7 mmol/L (ref 3.5–5.1)
Sodium: 133 mmol/L — ABNORMAL LOW (ref 135–145)

## 2016-03-07 LAB — CBC
HCT: 21.4 % — ABNORMAL LOW (ref 39.0–52.0)
Hemoglobin: 7 g/dL — ABNORMAL LOW (ref 13.0–17.0)
MCH: 29.3 pg (ref 26.0–34.0)
MCHC: 32.7 g/dL (ref 30.0–36.0)
MCV: 89.5 fL (ref 78.0–100.0)
Platelets: 251 10*3/uL (ref 150–400)
RBC: 2.39 MIL/uL — ABNORMAL LOW (ref 4.22–5.81)
RDW: 12.1 % (ref 11.5–15.5)
WBC: 6.9 10*3/uL (ref 4.0–10.5)

## 2016-03-07 LAB — GLUCOSE, CAPILLARY
Glucose-Capillary: 141 mg/dL — ABNORMAL HIGH (ref 65–99)
Glucose-Capillary: 258 mg/dL — ABNORMAL HIGH (ref 65–99)
Glucose-Capillary: 224 mg/dL — ABNORMAL HIGH (ref 65–99)
Glucose-Capillary: 190 mg/dL — ABNORMAL HIGH (ref 65–99)

## 2016-03-07 NOTE — Progress Notes (Signed)
[facility] PROGRESS NOTE  Lawrence [name] [id number] DOB: March 12, 1966 DOA: 02/27/2016  PCP: No primary care provider on file.   Reason for visit: Uncontrolled diabetes mellitus   Brief History/Interval Summary: 50 year old male of Hispanic origin who has a past medical history of poorly controlled diabetes who was admitted on September 22 after sustaining injury as a result of a fall from a ladder onto concrete. He was found to have communicated displaced right tibial plateau fracture. Reconstruction is planned for Tuesday, September 26. Medicine was consulted to assist with management of diabetes.  Procedures: 3 surgeries for comminuted right tibial plateau fracture.  Subjective/Interval History: Seen with his wife at bedside, had fever of 102.6 about 4 PM yesterday. Discussed briefly with ID, doesn't look to have UTI, pneumonia or other   ROS: Denies any nausea or vomiting.  Objective:  Vital Signs  Vitals:   03/06/16 1946 03/06/16 1959 03/07/16 0132 03/07/16 0427  BP:  137/83 128/71 132/79  Pulse:  (!) 107 (!) 117 (!) 101  Resp:      Temp: 100.2 F (37.9 C) 99.5 F (37.5 C) 99.1 F (37.3 C) 98.2 F (36.8 C)  TempSrc: Oral Oral Oral Oral  SpO2:  97% 100% 100%    Intake/Output Summary (Last 24 hours) at 03/07/16 1053 Last data filed at 03/07/16 0800  Gross per 24 hour  Intake             1240 ml  Output             1200 ml  Net               40 ml   There were no vitals filed for this visit.  General appearance: alert, cooperative, appears stated age, no distress and moderately obese Resp: clear to auscultation bilaterally Cardio: regular rate and rhythm, S1, S2 normal, no murmur, click, rub or gallop GI: soft, non-tender; bowel sounds normal; no masses,  no organomegaly Extremities: Right lower extremity is immobilized.  Lab Results:  Data Reviewed: I have personally reviewed following labs and imaging studies  CBC:  Recent Labs Lab 03/02/16 0400  03/04/16 0424 03/04/16 2047 03/05/16 1007 03/06/16 0543 03/07/16 0406  WBC 8.0 6.5 7.8 8.3 8.2 6.9  NEUTROABS 5.1  --   --   --   --   --   HGB 10.8* 10.9* 8.4* 8.2* 7.4* 7.0*  HCT 31.2* 32.1* 25.8* 25.0* 22.2* 21.4*  MCV 87.6 89.2 89.3 89.3 90.2 89.5  PLT 181 237 221 212 214 [id number]    Basic Metabolic Panel:  Recent Labs Lab 03/01/16 0512 03/02/16 0400 03/04/16 0424 03/05/16 0605 03/07/16 0406  NA 131* 133* 135  --  133*  K 3.4* 3.8 3.9  --  3.7  CL 98* 99* 97*  --  99*  CO2 27 27 28   --  29  GLUCOSE 122* 187* 181*  --  162*  BUN 10 10 7   --  <5*  CREATININE 0.58* 0.53* 0.60* 0.58* 0.56*  CALCIUM 8.0* 8.6*  8.6* 8.6*  --  8.0*  MG  --  1.9  --   --   --   PHOS  --  3.8  --   --   --     GFR: CrCl cannot be calculated (Unknown ideal weight.).  Coagulation Profile:  Recent Labs Lab 03/04/16 0424  INR 0.98    HbA1C: No results for input(s): HGBA1C in the last 72 hours.  CBG:  Recent Labs Lab  03/06/16 0723 03/06/16 1202 03/06/16 1625 03/06/16 2057 03/07/16 0703  GLUCAP 157* 146* 160* 145* 141*     Recent Results (from the past 240 hour(s))  Surgical pcr screen     Status: None   Collection Time: 02/27/16  5:57 PM  Result Value Ref Range Status   MRSA, PCR NEGATIVE NEGATIVE Final   Staphylococcus aureus NEGATIVE NEGATIVE Final    Comment:        The Xpert SA Assay (FDA approved for NASAL specimens in patients over 30 years of age), is one component of a comprehensive surveillance program.  Test performance has been validated by [facility] for patients greater than or equal to 75 year old. It is not intended to diagnose infection nor to guide or monitor treatment.   Culture, blood (Routine X 2) w Reflex to ID Panel     Status: None   Collection Time: 03/01/16  9:49 AM  Result Value Ref Range Status   Specimen Description BLOOD LEFT ARM  Final   Special Requests BOTTLES DRAWN AEROBIC AND ANAEROBIC 8CC EACH  Final   Culture NO GROWTH 5 DAYS   Final   Report Status 03/06/2016 FINAL  Final  Culture, blood (Routine X 2) w Reflex to ID Panel     Status: None   Collection Time: 03/01/16  9:53 AM  Result Value Ref Range Status   Specimen Description BLOOD RIGHT ARM  Final   Special Requests BOTTLES DRAWN AEROBIC AND ANAEROBIC 10CC EACH  Final   Culture NO GROWTH 5 DAYS  Final   Report Status 03/06/2016 FINAL  Final  Urine culture     Status: None   Collection Time: 03/02/16  1:15 AM  Result Value Ref Range Status   Specimen Description URINE, CLEAN CATCH  Final   Special Requests NONE  Final   Culture NO GROWTH  Final   Report Status 03/03/2016 FINAL  Final  Urine culture     Status: None   Collection Time: 03/05/16  8:14 AM  Result Value Ref Range Status   Specimen Description URINE, CATHETERIZED  Final   Special Requests NONE  Final   Culture NO GROWTH  Final   Report Status 03/06/2016 FINAL  Final      Radiology Studies: No results found.   Medications:  Scheduled: . acetaminophen  1,000 mg Oral Q6H  . calcium citrate  200 mg of elemental calcium Oral BID  . cholecalciferol  2,000 Units Oral BID  . docusate sodium  100 mg Oral BID  . enoxaparin (LOVENOX) injection  40 mg Subcutaneous Q24H  . insulin aspart  6 Units Subcutaneous TID AC  . insulin glargine  18 Units Subcutaneous QHS  . polyethylene glycol  17 g Oral BID  . senna  1 tablet Oral Daily  . vitamin C  500 mg Oral Daily   Continuous:   [id number], HYDROmorphone (DILAUDID) injection, methocarbamol **OR** methocarbamol (ROBAXIN)  IV, metoCLOPramide **OR** metoCLOPramide (REGLAN) injection, ondansetron **OR** ondansetron (ZOFRAN) IV, oxyCODONE, sodium chloride  Assessment/Plan:  Principal Problem:   Tibial plateau fracture Active Problems:   Diabetes (HCC)   Fever    Fever of unknown origin Again had a fever of 102.6 on 9/30, no leukocytosis does not look toxic. Blood cultures were done last time he had fever, cultures, urine  cultures repeated. It happens that patient has fever every time he goes to surgery. This could be related to atelectasis, x-ray and urinalysis without acute findings. Denies any right leg pain, has  good pulses, unlikely to have venous thromboembolism, no hypoxia or tachycardia. Discussed briefly with infectious disease, suggested to check around the surgical site rule any infection versus hematoma.  Anemia -Hemoglobin of 7.4, this is likely perioperative loss. If continued to drop consider transfusion.  Poorly controlled diabetes mellitus type II. HbA1c is 12.2 , implying very poor Glycemic control, this is correlate with mean plasma glucose of 303. Started on Lantus insulin adjusted to 18 units daily at bedtime. Currently on 6 units of NovoLog with meals, we will evaluate for prandial insulin versus metformin only on discharge.  Hyponatremia and hypokalemia Potassium is improved. This is resolved.  Constipation. Secondary to narcotics. We'll give MiraLAX and milk of magnesia.  Tibial plateau fracture Management per orthopedics.  Thank you for this consult. I will continue to this follow patient with you.    LOS: 9 days   [facility] Pager [phone] 03/07/2016, 10:53 AM  If 7PM-7AM, please contact night-coverage at [email or web address], password [facility]

## 2016-03-07 NOTE — Progress Notes (Signed)
Physical Therapy Treatment Patient Details Name: Lawrence [name] MRN: [id number] DOB: 10-Dec-1965 Today's Date: 03/07/2016    History of Present Illness Pt is a 50 y/o male admitted after falling from his ladder, sustaining a tibial plateau fx. Pt now with ex fix on L LE. No pertinent PMH.    PT Comments    Pt pleasant & agreeable to participate in PT session.  Completed w/c propulsion, re-education on w/c components, and stair training this session.  Pt tolerated session well.  Possible d/c home tomorrow per patient.      Follow Up Recommendations  Home health PT;Supervision for mobility/OOB     Equipment Recommendations  Rolling walker with 5" wheels;3in1 (PT);Wheelchair (measurements PT);Wheelchair cushion (measurements PT)    Recommendations for Other Services       Precautions / Restrictions Precautions Precautions: Fall Precaution Comments: Pt with ORIF now, external fixator removed 9/28 Required Braces or Orthoses: Other Brace/Splint Other Brace/Splint: Bledsoe brace on RLE at all times Restrictions RLE Weight Bearing: Non weight bearing    Mobility  Bed Mobility                  Transfers Overall transfer level: Needs assistance Equipment used: Rolling walker (2 wheeled) Transfers: Sit to/from Stand Sit to Stand: Min guard         General transfer comment: cues for hand placement & RLE positioning to ensure NWBing.    Ambulation/Gait                 Stairs Stairs: Yes Stairs assistance: Min assist Stair Management: Two rails;Step to pattern;Forwards;Backwards Number of Stairs: 2 General stair comments: completed stair both forwards & backwards.  Pt stating backwards technique easier due to ability to use UE's for more leverage with ascending.    Information systems manager mobility: Yes Wheelchair propulsion: Both upper extremities Wheelchair parts: Supervision/cueing Distance: education on wc compenents such as  brakes & leg rests.  propels wc with supervision Wheelchair Assistance Details (indicate cue type and reason): supervision  Modified Rankin (Stroke Patients Only)       Balance                                    Cognition Arousal/Alertness: Awake/alert Behavior During Therapy: WFL for tasks assessed/performed Overall Cognitive Status: Within Functional Limits for tasks assessed                      Exercises      General Comments        Pertinent Vitals/Pain Pain Assessment: 0-10 Pain Score: 4  Pain Descriptors / Indicators: Aching Pain Intervention(s): Limited activity within patient's tolerance;Monitored during session;Premedicated before session;Repositioned    Home Living                      Prior Function            PT Goals (current goals can now be found in the care plan section) Acute Rehab PT Goals Patient Stated Goal: return home PT Goal Formulation: With patient/family Time For Goal Achievement: 03/06/16 Potential to Achieve Goals: Good Progress towards PT goals: Progressing toward goals    Frequency    Min 5X/week      PT Plan Current plan remains appropriate    Co-evaluation             End of Session  Activity Tolerance: Patient tolerated treatment well Patient left: Other (comment);with family/visitor present;with call bell/phone within reach (wheelchair)     Time: [id number] PT Time Calculation (min) (ACUTE ONLY): 26 min  Charges:  $Therapeutic Activity: 8-22 mins $Wheel Chair Management: 8-22 mins                    G Codes:      Sena Hitch 03/07/2016, 12:38 PM   Sarajane Marek, PTA [phone] 03/07/2016

## 2016-03-07 NOTE — Progress Notes (Signed)
Subjective: 3 Days Post-Op Procedure(s) (LRB): REMOVAL EXTERNAL FIXATION LEG (Right) OPEN REDUCTION INTERNAL FIXATION (ORIF) TIBIAL PLATEAU (Right) Rimmed IM aspirate (Left) Patient reports pain as mild.    Objective: Vital signs in last 24 hours: Temp:  [98.2 F (36.8 C)-102.6 F (39.2 C)] 98.2 F (36.8 C) (10/01 0427) Pulse Rate:  [101-117] 101 (10/01 0427) Resp:  [18] 18 (09/30 1632) BP: (128-137)/(71-83) 132/79 (10/01 0427) SpO2:  [97 %-100 %] 100 % (10/01 0427)  Intake/Output from previous day: 09/30 0701 - 10/01 0700 In: 1240 [P.O.:1240] Out: 1200 [Urine:1200] Intake/Output this shift: No intake/output data recorded.   Recent Labs  03/04/16 2047 03/05/16 1007 03/06/16 0543 03/07/16 0406  HGB 8.4* 8.2* 7.4* 7.0*    Recent Labs  03/06/16 0543 03/07/16 0406  WBC 8.2 6.9  RBC 2.46* 2.39*  HCT 22.2* 21.4*  PLT 214 251    Recent Labs  03/05/16 0605 03/07/16 0406  NA  --  133*  K  --  3.7  CL  --  99*  CO2  --  29  BUN  --  <5*  CREATININE 0.58* 0.56*  GLUCOSE  --  162*  CALCIUM  --  8.0*   No results for input(s): LABPT, INR in the last 72 hours.  Sensation intact distally Intact pulses distally Dorsiflexion/Plantar flexion intact Incision: dressing C/D/I Compartment soft  Assessment/Plan: 3 Days Post-Op Procedure(s) (LRB): REMOVAL EXTERNAL FIXATION LEG (Right) OPEN REDUCTION INTERNAL FIXATION (ORIF) TIBIAL PLATEAU (Right) Rimmed IM aspirate (Left)  50 y/o male s/p fall off ladder (56ft) with complex bicondylar R tibial plateau fracture   - Fall of ladder  - R bicondylar tibial plateau fx with severe lateral joint depression S/P ORIF and RIA harvest from L femur  NWB Right Lower Extremity  WBAT Left leg  Unrestricted ROM R knee Hinged knee brace unlocked Can be out of hinge for ROM Brace only needs to be on  for mobilization  Ice and elevate No pillows under R knee at rest- will order bone foam   - Pain management: Continue with current regimen  Tylenol 1000 mg po q6h scheduled Oxy IR 5-10 mg po q3h prn pain Robaxin 500 mg po q6h prn spasms  Dilaudid 1mg  IV q2h prn severe pain   - ABL anemia/Hemodynamics Pt is tachy but no CP or SOB cbc Hgb this AM 7.0 Pt is jehovah's witness so no blood products  Continue with IVF   - Medical issues  DM Appreciate medicine assistance with management  Critical sugars remain in check to prevent complications   - DVT/PE prophylaxis: Lovenox x21 days post op   - Activity: NWB R leg   - Impediments to fracture healing: DIABETES!! Vitamin d deficiency  Testosterone deficiency   - Dispo: therapy evals Continue with tight sugar control Anticipate dc home on Monday 03/08/2016  Chriss Czar 03/07/2016, 9:14 AM

## 2016-03-08 DIAGNOSIS — W19XXXA Unspecified fall, initial encounter: Secondary | ICD-10-CM | POA: Diagnosis present

## 2016-03-08 LAB — CBC
HCT: 21.4 % — ABNORMAL LOW (ref 39.0–52.0)
Hemoglobin: 7.2 g/dL — ABNORMAL LOW (ref 13.0–17.0)
MCH: 29.5 pg (ref 26.0–34.0)
MCHC: 33.6 g/dL (ref 30.0–36.0)
MCV: 87.7 fL (ref 78.0–100.0)
Platelets: 297 10*3/uL (ref 150–400)
RBC: 2.44 MIL/uL — ABNORMAL LOW (ref 4.22–5.81)
RDW: 12.2 % (ref 11.5–15.5)
WBC: 6.3 10*3/uL (ref 4.0–10.5)

## 2016-03-08 LAB — GLUCOSE, CAPILLARY
Glucose-Capillary: 199 mg/dL — ABNORMAL HIGH (ref 65–99)
Glucose-Capillary: 194 mg/dL — ABNORMAL HIGH (ref 65–99)
Glucose-Capillary: 228 mg/dL — ABNORMAL HIGH (ref 65–99)

## 2016-03-08 LAB — TESTOSTERONE, % FREE: Testosterone-% Free: 0.8 % — ABNORMAL LOW (ref 0.2–0.7)

## 2016-03-08 MED ORDER — INSULIN ASPART 100 UNIT/ML ~~LOC~~ SOLN: 6.0000 [IU] | Freq: Three times a day (TID) | SUBCUTANEOUS | 11 refills | Status: DC

## 2016-03-08 MED ORDER — BLOOD GLUCOSE MONITOR KIT: PACK | 0 refills | Status: AC

## 2016-03-08 MED ORDER — OXYCODONE HCL 5 MG PO TABS: 5.0000 mg | ORAL_TABLET | ORAL | 0 refills | Status: DC | PRN

## 2016-03-08 MED ORDER — CHOLECALCIFEROL 50 MCG (2000 UT) PO TABS: 4000.0000 [IU] | ORAL_TABLET | Freq: Every day | ORAL | 2 refills | Status: DC

## 2016-03-08 MED ORDER — METHOCARBAMOL 500 MG PO TABS: 500.0000 mg | ORAL_TABLET | Freq: Four times a day (QID) | ORAL | 0 refills | Status: DC | PRN

## 2016-03-08 MED ORDER — ACETAMINOPHEN 500 MG PO TABS: 1000.0000 mg | ORAL_TABLET | Freq: Four times a day (QID) | ORAL | 0 refills | Status: DC

## 2016-03-08 MED ORDER — ASCORBIC ACID 500 MG PO TABS: 500.0000 mg | ORAL_TABLET | Freq: Every day | ORAL | 0 refills | Status: DC

## 2016-03-08 MED ORDER — DOCUSATE SODIUM 100 MG PO CAPS: 100.0000 mg | ORAL_CAPSULE | Freq: Two times a day (BID) | ORAL | 0 refills | Status: DC

## 2016-03-08 MED ORDER — INSULIN GLARGINE 100 UNIT/ML ~~LOC~~ SOLN: 18.0000 [IU] | Freq: Every day | SUBCUTANEOUS | 11 refills | Status: DC

## 2016-03-08 MED ORDER — INSULIN SYRINGES (DISPOSABLE) U-100 0.3 ML MISC: 18.0000 [IU] | Freq: Every day | 0 refills | Status: DC

## 2016-03-08 MED ORDER — ENOXAPARIN SODIUM 40 MG/0.4ML ~~LOC~~ SOLN: 40.0000 mg | SUBCUTANEOUS | 0 refills | Status: DC

## 2016-03-08 MED ORDER — CALCIUM CITRATE 950 (200 CA) MG PO TABS: 200.0000 mg | ORAL_TABLET | Freq: Two times a day (BID) | ORAL | 2 refills | Status: DC

## 2016-03-08 MED ORDER — INSULIN SYRINGES (DISPOSABLE) U-100 0.3 ML MISC: 6.0000 [IU] | Freq: Three times a day (TID) | 0 refills | Status: DC

## 2016-03-08 NOTE — Progress Notes (Signed)
[facility] Progress Note  Subjective  Doing great  Ready to go home  No specific concerns or complaints  Afebrile   Review of Systems  Constitutional: Negative for chills.  Eyes: Negative for blurred vision and double vision.  Respiratory: Negative for shortness of breath and wheezing.   Cardiovascular: Negative for chest pain and palpitations.  Gastrointestinal: Negative for abdominal pain, nausea and vomiting.  Neurological: Negative for tingling and sensory change.     Objective   BP (!) 156/96 (BP Location: Left Arm)   Pulse 89   Temp 98.1 F (36.7 C) (Oral)   Resp 18   SpO2 98%   Intake/Output      10/01 0701 - 10/02 0700 10/02 0701 - 10/03 0700   P.O. 640 400   Total Intake 640 400   Urine     Total Output       Net +640 +400        Urine Occurrence 3 x 1 x   Stool Occurrence 2 x      Labs  Results for GREOGRY, PFAHL (MRN [id number]) as of 03/08/2016 14:56  Ref. Range 03/08/2016 03:34  WBC Latest Ref Range: 4.0 - 10.5 K/uL 6.3  RBC Latest Ref Range: 4.22 - 5.81 MIL/uL 2.44 (L)  Hemoglobin Latest Ref Range: 13.0 - 17.0 g/dL 7.2 (L)  HCT Latest Ref Range: 39.0 - 52.0 % 21.4 (L)  MCV Latest Ref Range: 78.0 - 100.0 fL 87.7  MCH Latest Ref Range: 26.0 - 34.0 pg 29.5  MCHC Latest Ref Range: 30.0 - 36.0 g/dL 33.6  RDW Latest Ref Range: 11.5 - 15.5 % 12.2  Platelets Latest Ref Range: 150 - 400 K/uL 297   CBG (last 3)   Recent Labs  03/07/16 2109 03/08/16 0707 03/08/16 1222  GLUCAP 190* 199* 194*      Exam  Gen: awake, alert, resting comfortably in bed  Ext:       Right Lower Extremity   Dressings removed  Incisions look great  No signs of infection  Swelling well controlled  DPN,SPN, TN sensation intact  EHL, FHL, AT, PT, peroneals, gastroc motor intact  + DP pulse  No DCT  Compartments are soft        Left hip incision looks great, no signs of infection    Assessment and Plan   POD/HD#: 25  50 y/o male s/p fall off  ladder (81ft) with complex bicondylar R tibial plateau fracture    - Fall of ladder   - R bicondylar tibial plateau fx with severe lateral joint depression             S/P  ORIF and RIA harvest from L femur               NWB Right Lower Extremity               WBAT Left leg               Unrestricted ROM R knee                         Hinged knee brace unlocked                         Can be out of hinge for ROM  Brace only needs to be on for mobilization                          Ice and elevate             bone foam   Dressing changes as needed  Ok to start showering in 2 days, clean wounds with soap and water               - Pain management:      Continue with current regimen               Tylenol 1000 mg po q6h scheduled             Oxy IR 5-10 mg po q3h prn pain             Robaxin 500 mg po q6h prn spasms          - ABL anemia/Hemodynamics            stable              Pt is jehovah's witness so no blood products                  - Medical issues               DM                         Appreciate medicine assistance with management                           Critical sugars remain in check to prevent complications     Discharge regimen:    lantus 18 units at bedtime    novolog 6 units with meals                   Fever                        afebrile x 48 hours                              - DVT/PE prophylaxis:             Lovenox x21 days post op    - ID:              periop ancef     - Metabolic Bone Disease:             vitamin d deficiency, 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             Vitamin d deficiency              Testosterone deficiency    - Dispo:             therapy             Continue with tight sugar control           Dc home today  Follow up with ortho in 10-14 days  Follow up with pcp in 1week   Jari Pigg, PA-C [facility] [phone] (O) 03/08/2016 2:55 PM

## 2016-03-08 NOTE — Progress Notes (Signed)
   [facility] PROGRESS NOTE  Lawrence [name] [id number] DOB: 09/05/1965 DOA: 02/27/2016  PCP: No primary care provider on file.   Reason for visit: Uncontrolled diabetes mellitus   Procedures: 3 surgeries for comminuted right tibial plateau fracture.  Subjective/Interval History: Very excited he might go home today, seen with his wife at bedside. Please see below for diabetes regimen recommendations and the fever.  Assessment/Plan:  Principal Problem:   Tibial plateau fracture Active Problems:   Diabetes (HCC)   Fever  Fever of unknown origin Again had a fever of 102.6 on 9/30, no leukocytosis does not look toxic and without leukocytosis. Blood cultures were done last time he had fever, cultures, urine cultures repeated, cultures without growth. It happens that patient has fever every time he goes to surgery. This could be related to atelectasis, x-ray and urinalysis without acute findings.  Denies any right leg pain, has good pulses, unlikely to have venous thromboembolism, no hypoxia or tachycardia. Discussed briefly with infectious disease, suggested to check around the surgical site rule any infection versus hematoma.  Anemia -Hemoglobin of 7.4, this is likely perioperative loss. Tolerating without symptoms, no transfusion indicated.  Poorly controlled diabetes mellitus type II. HbA1c is 12.2 , implying very poor Glycemic control, this is correlate with mean plasma glucose of 303. Recommend Lantus 18 units at night and NovoLog 6 units with meals on discharge.  Hyponatremia and hypokalemia This is resolved.  Constipation. Secondary to narcotics. An multiple bowel movements after milk of magnesia.  Tibial plateau fracture Management per orthopedics.     LOS: 10 days   [facility] Pager [phone] 03/08/2016, 11:07 AM  If 7PM-7AM, please contact night-coverage at [email or web address], password [email or web address]

## 2016-03-08 NOTE — Care Management Note (Addendum)
Case Management Note  Patient Details  Name: Lawrence [name] MRN: [id number] Date of Birth: 06/22/1965  Original Note Created by Jonnie Finner 03/06/16 Subjective/Objective:   Fall from ladder with complex bicondylar right tibial plateau fracture, s/p ORIF and RIA harvest from L femur               Action/Plan: Discharge Planning:  NCM spoke to dtr, Lawrence [name] # [phone] and wife. Pt has wheelchair and RW in room. Requesting 3n1 bedside commode for home. Will order from [facility] on 03/06/2016. Offered choice for [facility]. Dtr states HH arranged with [facility].    Expected Discharge Date:                  Expected Discharge Plan:  [place]  In-House Referral:  NA  Discharge planning Services  CM Consult  Post Acute Care Choice:  Home Health Choice offered to:  Adult Children  DME Arranged:  3-N-1, Wheelchair manual, Walker rolling DME Agency:  [place] Arranged:  PT, OT, OT [place] Agency:  [facility]  Status of Service:  Completed, signed off  If discussed at [place] of Stay Meetings, dates discussed:    Additional Comments: 03/08/2016 Elenor Quinones, RN, BSN [phone] CM submitted benefit check for enoxaparin S/W [name] @ [employer] # [id number] OPT-1   LOVENOX  40 MG  DAILY   COVER- YES  CO-PAY- $ 320.92  TIER-4 DRUG  PRIOR APPROVAL - NO   ENOXAPARIN 40 MG DAILY   COVER- YES  CO-PAY- $ 10.00  TIER- 2 DRUG  PRIOR APPROVAL - NO   CM informed pt of copay and called pharmacy of choice [facility] in [place] off [address] can fill prescription today.  CM confirmed with bedside nurse Maudie Mercury; pt has been educated on Lovenox/insulin preparation and administration    CM confirmed with pt and wife that wife will be with pt post discharge.  Pt confirmed the he was just recently set up with [facility] for OT/PT - CM verified this with agency - resumption orders written- agency informed of pending discharge today.  Pt  family have already purchased 3:1 and have taken equipment home. Wheelchair, wheelchair cushion and rolling walker are in the pts room at this time.  Pt states he plans to stop by [facility] to get other supplies including urinal and tub bench post discharge.   Pt denied barriers to discharging home.   Maryclare Labrador, RN 03/08/2016, 8:56 AM

## 2016-03-08 NOTE — Discharge Instructions (Signed)
Orthopaedic Trauma Service Discharge Instructions   General Discharge Instructions  WEIGHT BEARING STATUS: Nonweightbearing Right leg  RANGE OF MOTION/ACTIVITY: unrestricted range of motion right knee. Ok to be out of knee brace when not up moving.   Wound Care: daily wound care starting on 03/10/2016. See below  Discharge Wound Care Instructions  Do NOT apply any ointments, solutions or lotions to pin sites or surgical wounds.  These prevent needed drainage and even though solutions like hydrogen peroxide kill bacteria, they also damage cells lining the pin sites that help fight infection.  Applying lotions or ointments can keep the wounds moist and can cause them to breakdown and open up as well. This can increase the risk for infection. When in doubt call the office.  Surgical incisions should be dressed daily.  If any drainage is noted, use one layer of adaptic, then gauze, Kerlix, and an ace wrap.  Once the incision is completely dry and without drainage, it may be left open to air out.  Showering may begin 36-48 hours later.  Cleaning gently with soap and water.  Traumatic wounds should be dressed daily as well.    One layer of adaptic, gauze, Kerlix, then ace wrap.  The adaptic can be discontinued once the draining has ceased    If you have a wet to dry dressing: wet the gauze with saline the squeeze as much saline out so the gauze is moist (not soaking wet), place moistened gauze over wound, then place a dry gauze over the moist one, followed by Kerlix wrap, then ace wrap.   PAIN MEDICATION USE AND EXPECTATIONS  You have likely been given narcotic medications to help control your pain.  After a traumatic event that results in an fracture (broken bone) with or without surgery, it is ok to use narcotic pain medications to help control one's pain.  We understand that everyone responds to pain differently and each individual patient will be evaluated on a regular basis for the  continued need for narcotic medications. Ideally, narcotic medication use should last no more than 6-8 weeks (coinciding with fracture healing).   As a patient it is your responsibility as well to monitor narcotic medication use and report the amount and frequency you use these medications when you come to your office visit.   We would also advise that if you are using narcotic medications, you should take a dose prior to therapy to maximize you participation.  IF YOU ARE ON NARCOTIC MEDICATIONS IT IS NOT PERMISSIBLE TO OPERATE A MOTOR VEHICLE (MOTORCYCLE/CAR/TRUCK/MOPED) OR HEAVY MACHINERY DO NOT MIX NARCOTICS WITH OTHER CNS (CENTRAL NERVOUS SYSTEM) DEPRESSANTS SUCH AS ALCOHOL  Diet: as you were eating previously.  Can use over the counter stool softeners and bowel preparations, such as Miralax, to help with bowel movements.  Narcotics can be constipating.  Be sure to drink plenty of fluids    STOP SMOKING OR USING NICOTINE PRODUCTS!!!!  As discussed nicotine severely impairs your body's ability to heal surgical and traumatic wounds but also impairs bone healing.  Wounds and bone heal by forming microscopic blood vessels (angiogenesis) and nicotine is a vasoconstrictor (essentially, shrinks blood vessels).  Therefore, if vasoconstriction occurs to these microscopic blood vessels they essentially disappear and are unable to deliver necessary nutrients to the healing tissue.  This is one modifiable factor that you can do to dramatically increase your chances of healing your injury.    (This means no smoking, no nicotine gum, patches, etc)  DO NOT  USE NONSTEROIDAL ANTI-INFLAMMATORY DRUGS (NSAID'S)  Using products such as Advil (ibuprofen), Aleve (naproxen), Motrin (ibuprofen) for additional pain control during fracture healing can delay and/or prevent the healing response.  If you would like to take over the counter (OTC) medication, Tylenol (acetaminophen) is ok.  However, some narcotic medications  that are given for pain control contain acetaminophen as well. Therefore, you should not exceed more than 4000 mg of tylenol in a day if you do not have liver disease.  Also note that there are may OTC medicines, such as cold medicines and allergy medicines that my contain tylenol as well.  If you have any questions about medications and/or interactions please ask your doctor/PA or your pharmacist.      ICE AND ELEVATE INJURED/OPERATIVE EXTREMITY  Using ice and elevating the injured extremity above your heart can help with swelling and pain control.  Icing in a pulsatile fashion, such as 20 minutes on and 20 minutes off, can be followed.    Do not place ice directly on skin. Make sure there is a barrier between to skin and the ice pack.    Using frozen items such as frozen peas works well as the conform nicely to the are that needs to be iced.  USE AN ACE WRAP OR TED HOSE FOR SWELLING CONTROL  In addition to icing and elevation, Ace wraps or TED hose are used to help limit and resolve swelling.  It is recommended to use Ace wraps or TED hose until you are informed to stop.    When using Ace Wraps start the wrapping distally (farthest away from the body) and wrap proximally (closer to the body)   Example: If you had surgery on your leg or thing and you do not have a splint on, start the ace wrap at the toes and work your way up to the thigh        If you had surgery on your upper extremity and do not have a splint on, start the ace wrap at your fingers and work your way up to the upper arm  IF YOU ARE IN A SPLINT OR CAST DO NOT [place]   If your splint gets wet for any reason please contact the office immediately. You may shower in your splint or cast as long as you keep it dry.  This can be done by wrapping in a cast cover or garbage back (or similar)  Do Not stick any thing down your splint or cast such as pencils, money, or hangers to try and scratch yourself with.  If you feel  itchy take benadryl as prescribed on the bottle for itching  IF YOU ARE IN A CAM BOOT (BLACK BOOT)  You may remove boot periodically. Perform daily dressing changes as noted below.  Wash the liner of the boot regularly and wear a sock when wearing the boot. It is recommended that you sleep in the boot until told otherwise  CALL THE OFFICE WITH ANY QUESTIONS OR CONCERNS: [phone]

## 2016-03-08 NOTE — Progress Notes (Signed)
Pt instructed in how to give insulin injection. He had some difficulty in seeing the numbers on the syringe but he did well.

## 2016-03-08 NOTE — Progress Notes (Signed)
Physical Therapy Treatment Patient Details Name: Lawrence [name] MRN: [id number] DOB: 1966/04/18 Today's Date: 03/08/2016    History of Present Illness Pt is a 50 y/o male admitted after falling from his ladder, sustaining a tibial plateau fx. Pt now with ex fix on L LE. No pertinent PMH.    PT Comments    Pt to d/c today reviewed bed mobility strategies in prep for d/c home.  Pt advanced gait distance from previous session.  Wife present and very supportive during session.  Pt has all equipment needs.    Follow Up Recommendations  Home health PT;Supervision for mobility/OOB     Equipment Recommendations  Rolling walker with 5" wheels;3in1 (PT);Wheelchair (measurements PT);Wheelchair cushion (measurements PT)    Recommendations for Other Services       Precautions / Restrictions Precautions Precautions: Fall Precaution Comments: Pt with ORIF now, external fixator removed 9/28 Required Braces or Orthoses: Other Brace/Splint Other Brace/Splint: Bledsoe brace on RLE at all times Restrictions Weight Bearing Restrictions: Yes RLE Weight Bearing: Non weight bearing    Mobility  Bed Mobility Overal bed mobility: Needs Assistance Bed Mobility: Supine to Sit     Supine to sit: Min assist (with trunk control used gait belt to assist RLE to edge of bed.  )     General bed mobility comments: Placed bed in flat position and removed rails to simulate home environment.    Transfers Overall transfer level: Needs assistance Equipment used: Rolling walker (2 wheeled) Transfers: Sit to/from Stand Sit to Stand: Supervision;Min assist         General transfer comment: Supervision for safety no LOB noted, required assist to during stand to sit to support RLE.    Ambulation/Gait Ambulation/Gait assistance: Min guard Ambulation Distance (Feet): 50 Feet Assistive device: Rolling walker (2 wheeled) Gait Pattern/deviations: Step-to pattern Gait velocity: decreased   General Gait  Details: No dizziness noted, good carryover with gait training.     Stairs Stairs:  (reports he feels confident with stair training from previous session.  )          Information systems manager mobility:  (reports he feels confident with mobility and parts.  Wife and patient both verbalize understanding.  )  Modified Rankin (Stroke Patients Only)       Balance Overall balance assessment: Needs assistance   Sitting balance-Leahy Scale: Fair       Standing balance-Leahy Scale: Poor                      Cognition Arousal/Alertness: Awake/alert Behavior During Therapy: WFL for tasks assessed/performed Overall Cognitive Status: Within Functional Limits for tasks assessed                      Exercises      General Comments        Pertinent Vitals/Pain Pain Assessment: 0-10 Pain Score: 4  Pain Location: RLE Pain Descriptors / Indicators: Aching Pain Intervention(s): Limited activity within patient's tolerance;Monitored during session;Ice applied;Repositioned    Home Living                      Prior Function            PT Goals (current goals can now be found in the care plan section) Acute Rehab PT Goals Patient Stated Goal: return home Potential to Achieve Goals: Good Progress towards PT goals: Progressing toward goals    Frequency  Min 5X/week      PT Plan Current plan remains appropriate    Co-evaluation             End of Session Equipment Utilized During Treatment: Gait belt Activity Tolerance: Patient tolerated treatment well Patient left: in chair;with call bell/phone within reach;with family/visitor present     Time: [id number] PT Time Calculation (min) (ACUTE ONLY): 19 min  Charges:  $Gait Training: 8-22 mins                    G Codes:      Cristela Blue 03/11/16, 10:46 AM Governor Rooks, PTA pager [phone]

## 2016-03-08 NOTE — Progress Notes (Addendum)
Pt discharge with medications and instructions with full understanding, reviewed how to do injections both lovenox and insulin with full understanding

## 2016-03-08 NOTE — Progress Notes (Signed)
Occupational Therapy Treatment Patient Details Name: Lawrence [name] MRN: [id number] DOB: 1966-05-06 Today's Date: 03/08/2016    History of present illness Pt is a 50 y/o male admitted after falling from his ladder, sustaining a tibial plateau fx. Pt now with ex fix on L LE. No pertinent PMH.   OT comments  Pt continues to progress well with Ot. Pt, wife, and son educated on use of AE during UB and LB dressing tasks. Pt required min assist with donning shirt, and mod assist with donning pants this date with wife able to provide necessary assistance. Pt also educated concerning use of leg lifter to increase independence with bed mobility. Pt reports plan to D/C home this afternoon with 24 hour assistance from family. Continue to recommend home health OT and 3 in 1 bed side commode post-acute D/C. Will continue to follow while admitted.  Follow Up Recommendations  Home health OT;Supervision/Assistance - 24 hour    Equipment Recommendations  3 in 1 bedside comode    Recommendations for Other Services      Precautions / Restrictions Precautions Precautions: Fall Precaution Comments: Pt with ORIF now, external fixator removed 9/28 Required Braces or Orthoses: Other Brace/Splint (Bledsor brace on RLE at all times) Other Brace/Splint: Bledsoe brace on RLE at all times Restrictions Weight Bearing Restrictions: Yes RLE Weight Bearing: Non weight bearing       Mobility Bed Mobility Overal bed mobility: Needs Assistance Bed Mobility: Supine to Sit     Supine to sit: Min guard (With leg lifter)     General bed mobility comments: Placed bed in flat position and removed rails to simulate home environment.    Transfers Overall transfer level: Needs assistance Equipment used: Rolling walker (2 wheeled) Transfers: Sit to/from Stand Sit to Stand: Min guard         General transfer comment: Min guard for safety and to maintain NWB precautions RLE    Balance Overall balance assessment:  Needs assistance Sitting-balance support: Feet supported;Single extremity supported Sitting balance-Leahy Scale: Fair     Standing balance support: During functional activity;Single extremity supported Standing balance-Leahy Scale: Poor Standing balance comment: Pt relies heavily on bilateral UEs on RW. Unsteady when supported by single upper extremity.                   ADL Overall ADL's : Needs assistance/impaired     Grooming: Wash/dry hands;Min guard;Standing           Upper Body Dressing : Minimal assistance;Sitting Upper Body Dressing Details (indicate cue type and reason): Pt donned shirt with LEs propped (long sitting) and required assist from wife to pull down due to positioning. Lower Body Dressing: Moderate assistance;With adaptive equipment;Sit to/from stand Lower Body Dressing Details (indicate cue type and reason): Pt required assist from wife to pull up pants and to lift R leg to thread R leg into pants. Toilet Transfer: Engineer, maintenance (IT) Details (indicate cue type and reason): Simulated in recliner Toileting- Clothing Manipulation and Hygiene: Minimal assistance;Sit to/from stand Toileting - Clothing Manipulation Details (indicate cue type and reason): Min assist to maintain balance during dynamic standing task.     Functional mobility during ADLs: Min guard;Rolling walker (for safety) General ADL Comments: Pt progressing well with ADL. Educated on use of leg lifter for bed mobility per pt request. Continued education with pt, wife, and son concerning use of AE, dressing techniques and safe ADL transfers.      Vision  Perception     Praxis      Cognition   Behavior During Therapy: WFL for tasks assessed/performed Overall Cognitive Status: Within Functional Limits for tasks assessed                       Extremity/Trunk Assessment               Exercises     Shoulder  Instructions       General Comments      Pertinent Vitals/ Pain       Pain Assessment: Faces Faces Pain Scale: Hurts little more Pain Location: RLE Pain Descriptors / Indicators: Discomfort;Aching;Sore Pain Intervention(s): Monitored during session;Ice applied;Repositioned  Home Living                                          Prior Functioning/Environment              Frequency  Min 3X/week        Progress Toward Goals  OT Goals(current goals can now be found in the care plan section)  Progress towards OT goals: Progressing toward goals  Acute Rehab OT Goals Patient Stated Goal: return home OT Goal Formulation: With patient/family Time For Goal Achievement: 03/13/16 Potential to Achieve Goals: Good ADL Goals Pt Will Perform Grooming: with supervision;standing Pt Will Perform Upper Body Bathing: with modified independence;sitting Pt Will Perform Lower Body Bathing: with supervision;sit to/from stand Pt Will Transfer to Toilet: with supervision;ambulating;bedside commode Pt Will Perform Toileting - Clothing Manipulation and hygiene: with modified independence;sit to/from stand Pt Will Perform Tub/Shower Transfer: Tub transfer;with min assist;with caregiver independent in assisting;3 in 1;ambulating;rolling walker  Plan Discharge plan remains appropriate    Co-evaluation                 End of Session Equipment Utilized During Treatment: Gait belt;Rolling walker   Activity Tolerance Patient tolerated treatment well   Patient Left with family/visitor present;in bed;with call bell/phone within reach   Nurse Communication          Time: [id number] OT Time Calculation (min): 45 min  Charges: OT General Charges $OT Visit: 1 Procedure OT Treatments $Self Care/Home Management : 38-52 mins  Norman Herrlich, OTR/L [phone] 03/08/2016, 4:33 PM

## 2016-03-08 NOTE — Discharge Summary (Signed)
Orthopaedic Trauma Service (OTS)  Patient ID: BURNHAM TROST MRN: [id number] DOB/AGE: Jan 17, 1966 50 y.o.  Admit date: 02/27/2016 Discharge date: 03/08/2016  Admission Diagnoses: Fall from ladder Closed R tibial plateau fracture Diabetes  Discharge Diagnoses:  Principal Problem:   Tibial plateau fracture Active Problems:   Diabetes [facility])   Fall   Procedures Performed:  02/27/2016- Dr. Veverly Fells   Closed reduction and external fixation of right tibial plateau fracture (spanning external fixator  03/04/2016- Dr. Marcelino Scot ORIF R tibial plateau fx Repair of R lateral meniscus Autograft harvest L femur, RIA   Discharged Condition: good  Hospital Course:   SURAFEL HILLEARY is an 50 y.o. hispanic male who sustained a fall on 02/27/2016. Patient fell approximately 7 feet off of a ladder. He was working above the garage when the gradual open not him off his ladder. He landed on concrete below. Patient requested to brought to [place]. He was seen and evaluated in the emergency department where he was found to have an isolated orthopedic injury with a complex bicondylar right tibial plateau fracture. Patient was seen and evaluated by Dr. Alma Friendly on the day of presentation. given severe impaction and shortening of his right leg along with instability patient was taken urgently to the OR for application of a spanning external fixator.On admission patient was noted to have a severely elevated blood sugar levels above 300. Follow-up hemoglobin A1c was noted to be 12.2%. Internal medicine was consulted to assist with diabetes management.Due to the complexity of the injury the orthopedic trauma service was consult for definitive management. Patient reports diabetes for 20+ years. States he was on metformin prior to admission has never instructed to be on insulin. Says he had a PCP [facility] health but this physician has moved on and now currently without a primary care physician. Patient has been started  on insulin here in the hospital including sliding scale sugar control has been better over the last several days   Patient was taken to the OR on 03/04/2016 for definitive fixation of his complex right tibial plateau fracture. Given the profound depression of his lateral plateau we felt that it was necessary to obtain autograft from his left femur via ringed intramedullary aspirate. Patient tolerated the procedure well after surgery he was transferred to the PACU for recovery from anesthesia and taken back to the orthopedic for for continued observation, pain control and to resume therapies.  Overall patient's hospital stay was relatively uncomplicated. He was on routine antibiotic prophylaxis for surgery. He was started on Lovenox for DVT and PE prophylaxis as well. He is on sliding scale insulin for his diabetes and was started on Lantus by the medical service as well.  Patient did on occasion spike low-grade fevers however extensive workup was performed and did not identify any additional etiology other than probable atelectasis    patient worked diligently with therapies and progressed well. On postoperative day #4 following definitive fixation patient was deemed stable for discharge.    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 He was started on vitamin D and calcium supplementation. Once his vitamin D levels were corrected we'll recheck his testosterone and referred to endocrinology or PCP if these levels remain depressed. Possibly anticipate starting Forteo therapy postoperatively the patient will absolutely need a bone density scan in the coming weeks given his poorly controlled  diabetes.     Consults: internal medicine   Significant Diagnostic Studies: labs:  Results for JOMES, GIRALDO (MRN  [id number]) as of 03/08/2016 15:21  Ref. Range 03/08/2016 03:34  WBC Latest Ref Range: 4.0 - 10.5 K/uL 6.3  RBC Latest Ref Range: 4.22 - 5.81 MIL/uL 2.44 (L)  Hemoglobin Latest Ref Range: 13.0 - 17.0 g/dL 7.2 (L)  HCT Latest Ref Range: 39.0 - 52.0 % 21.4 (L)  MCV Latest Ref Range: 78.0 - 100.0 fL 87.7  MCH Latest Ref Range: 26.0 - 34.0 pg 29.5  MCHC Latest Ref Range: 30.0 - 36.0 g/dL 33.6  RDW Latest Ref Range: 11.5 - 15.5 % 12.2  Platelets Latest Ref Range: 150 - 400 K/uL 297  Results for HIRO, VIPOND (MRN [id number]) as of 03/08/2016 15:21  Ref. Range 03/02/2016 04:00  Vit D, 1,25-Dihydroxy Latest Ref Range: 19.9 - 79.3 pg/mL 67.0  Vitamin D, 25-Hydroxy Latest Ref Range: 30.0 - 100.0 ng/mL 18.9 (L)   Results for PASCHAL, BLANTON (MRN [id number]) as of 03/08/2016 15:21  Ref. Range 03/02/2016 04:00  Sex Horm Binding Glob, Serum Latest Ref Range: 19.3 - 76.4 nmol/L 50.4  Testosterone Latest Ref Range: 264 - 916 ng/dL 123 (L)  Testosterone Free Latest Ref Range: 7.2 - 24.0 pg/mL 1.7 (L)  Testosterone-% Free Latest Ref Range: 0.2 - 0.7 % 0.8 (L)  PTH Latest Ref Range: 15 - 65 pg/mL 28  TSH Latest Ref Range: 0.350 - 4.500 uIU/mL 1.453   Results for SKYLER, CAREL (MRN [id number]) as of 03/08/2016 15:21  Ref. Range 03/03/2016 11:59 03/04/2016 04:24  LH Latest Ref Range: 1.7 - 8.6 mIU/mL  1.3 (L)  FSH Latest Ref Range: 1.5 - 12.4 mIU/mL 1.5   Prolactin Latest Ref Range: 4.0 - 15.2 ng/mL  19.1 (H)  Results for EBON, KETCHUM (MRN [id number]) as of 03/08/2016 15:21  Ref. Range 03/02/2016 04:00  Calcium, Ionized, Serum Latest Ref Range: 4.5 - 5.6 mg/dL 4.8  Calcium, Total (PTH) Latest Ref Range: 8.7 - 10.2 mg/dL 8.6 (L)  Phosphorus Latest Ref Range: 2.5 - 4.6 mg/dL 3.8  Magnesium Latest Ref Range: 1.7 - 2.4 mg/dL 1.9   Treatments: IV hydration, antibiotics: Ancef, analgesia: oxycodone, dilaudid, robaxin, anticoagulation: LMW heparin, insulin: Lantus and SSI (novolog), therapies: PT, OT and RN and  surgery: as above   Discharge Exam:  Orthopaedic Trauma Service Progress Note   Subjective   Doing great  Ready to go home  No specific concerns or complaints   Afebrile    Review of Systems  Constitutional: Negative for chills.  Eyes: Negative for blurred vision and double vision.  Respiratory: Negative for shortness of breath and wheezing.   Cardiovascular: Negative for chest pain and palpitations.  Gastrointestinal: Negative for abdominal pain, nausea and vomiting.  Neurological: Negative for tingling and sensory change.        Objective    BP (!) 156/96 (BP Location: Left Arm)   Pulse 89   Temp 98.1 F (36.7 C) (Oral)   Resp 18   SpO2 98%    Intake/Output      10/01 0701 - 10/02 0700 10/02 0701 - 10/03 0700   P.O. 640 400   Total Intake 640 400   Urine     Total Output       Net +640 +400        Urine Occurrence 3 x 1 x   Stool Occurrence 2 x       Labs  Results for COLLIS, THEDE (MRN [id number]) as of 03/08/2016 14:56   Ref. Range 03/08/2016 03:34  WBC Latest Ref Range: 4.0 - 10.5 K/uL 6.3  RBC Latest Ref Range: 4.22 - 5.81 MIL/uL 2.44 (L)  Hemoglobin Latest Ref Range: 13.0 - 17.0 g/dL 7.2 (L)  HCT Latest Ref Range: 39.0 - 52.0 % 21.4 (L)  MCV Latest Ref Range: 78.0 - 100.0 fL 87.7  MCH Latest Ref Range: 26.0 - 34.0 pg 29.5  MCHC Latest Ref Range: 30.0 - 36.0 g/dL 33.6  RDW Latest Ref Range: 11.5 - 15.5 % 12.2  Platelets Latest Ref Range: 150 - 400 K/uL 297    CBG (last 3)   Recent Labs (last 2 labs)     Recent Labs   03/07/16 2109 03/08/16 0707 03/08/16 1222  GLUCAP 190* 199* 194*            Exam   Gen: awake, alert, resting comfortably in bed  Ext:       Right Lower Extremity              Dressings removed             Incisions look great             No signs of infection             Swelling well controlled             DPN,SPN, TN sensation intact             EHL, FHL, AT, PT, peroneals, gastroc motor intact             + DP  pulse             No DCT             Compartments are soft         Left hip incision looks great, no signs of infection      Assessment and Plan    POD/HD#: 4   50 y/o male s/p fall off ladder (86f) with complex bicondylar R tibial plateau fracture    - Fall of ladder   - R bicondylar tibial plateau fx with severe lateral joint depression             S/P  ORIF and RIA harvest from L femur               NWB Right Lower Extremity               WBAT Left leg               Unrestricted ROM R knee                         Hinged knee brace unlocked                         Can be out of hinge for ROM                         Brace only needs to be on for mobilization               Ice and elevate             bone foam               Dressing changes as needed  Ok to start showering in 2 days, clean wounds with soap and water    - Pain management:      Continue with current regimen               Tylenol 1000 mg po q6h scheduled             Oxy IR 5-10 mg po q3h prn pain             Robaxin 500 mg po q6h prn spasms      - ABL anemia/Hemodynamics            stable              Pt is jehovah's witness so no blood products      - Medical issues               DM                         Appreciate medicine assistance with management                           Critical sugars remain in check to prevent complications                            Discharge regimen:                          lantus 18 units at bedtime                          novolog 6 units with meals                                         Fever                        afebrile x 48 hours                             - DVT/PE prophylaxis:             Lovenox x21 days post op    - ID:              periop ancef     - Metabolic Bone Disease:             vitamin d deficiency, testosterone deficiency/hypogodadal osteoporosis- likely related to uncontrolled DM             Will need vitamin d  supplementation, calcium supplementation             Recheck testosterone levels once vitamin D corrected                  Anticipate starting forteo therapy post-op             Will absolutely need DEXA as outpt    - Activity:             NWB R leg    - FEN/GI prophylaxis/Foley/Lines:             carb mod diet      - Impediments to fracture healing:  DIABETES!!             Vitamin d deficiency              Testosterone deficiency    - Dispo:             therapy             Continue with tight sugar control                      Dc home today             Follow up with ortho in 10-14 days             Follow up with pcp in 1week     Disposition: home with home health PT  Discharge Instructions    Ambulatory referral to Nutrition and Diabetic Education    Complete by:  As directed    Elevated A1c   Call MD / Call 911    Complete by:  As directed    If you experience chest pain or shortness of breath, CALL 911 and be transported to the hospital emergency room.  If you develope a fever above 101 F, pus (white drainage) or increased drainage or redness at the wound, or calf pain, call your surgeon's office.   Constipation Prevention    Complete by:  As directed    Drink plenty of fluids.  Prune juice may be helpful.  You may use a stool softener, such as Colace (over the counter) 100 mg twice a day.  Use MiraLax (over the counter) for constipation as needed.   Diet - low sodium heart healthy    Complete by:  As directed    Discharge instructions    Complete by:  As directed    Orthopaedic Trauma Service Discharge Instructions   General Discharge Instructions  WEIGHT BEARING STATUS: Nonweightbearing Right leg  RANGE OF MOTION/ACTIVITY: unrestricted range of motion right knee. Ok to be out of knee brace when not up moving.   Wound Care: daily wound care starting on 03/10/2016. See below  Discharge Wound Care Instructions  Do NOT apply any ointments,  solutions or lotions to pin sites or surgical wounds.  These prevent needed drainage and even though solutions like hydrogen peroxide kill bacteria, they also damage cells lining the pin sites that help fight infection.  Applying lotions or ointments can keep the wounds moist and can cause them to breakdown and open up as well. This can increase the risk for infection. When in doubt call the office.  Surgical incisions should be dressed daily.  If any drainage is noted, use one layer of adaptic, then gauze, Kerlix, and an ace wrap.  Once the incision is completely dry and without drainage, it may be left open to air out.  Showering may begin 36-48 hours later.  Cleaning gently with soap and water.  Traumatic wounds should be dressed daily as well.    One layer of adaptic, gauze, Kerlix, then ace wrap.  The adaptic can be discontinued once the draining has ceased    If you have a wet to dry dressing: wet the gauze with saline the squeeze as much saline out so the gauze is moist (not soaking wet), place moistened gauze over wound, then place a dry gauze over the moist one, followed by Kerlix wrap, then ace wrap.   PAIN MEDICATION USE AND EXPECTATIONS  You have likely been given narcotic medications to help  control your pain.  After a traumatic event that results in an fracture (broken bone) with or without surgery, it is ok to use narcotic pain medications to help control one's pain.  We understand that everyone responds to pain differently and each individual patient will be evaluated on a regular basis for the continued need for narcotic medications. Ideally, narcotic medication use should last no more than 6-8 weeks (coinciding with fracture healing).   As a patient it is your responsibility as well to monitor narcotic medication use and report the amount and frequency you use these medications when you come to your office visit.   We would also advise that if you are using narcotic medications,  you should take a dose prior to therapy to maximize you participation.  IF YOU ARE ON NARCOTIC MEDICATIONS IT IS NOT PERMISSIBLE TO OPERATE A MOTOR VEHICLE (MOTORCYCLE/CAR/TRUCK/MOPED) OR HEAVY MACHINERY DO NOT MIX NARCOTICS WITH OTHER CNS (CENTRAL NERVOUS SYSTEM) DEPRESSANTS SUCH AS ALCOHOL  Diet: as you were eating previously.  Can use over the counter stool softeners and bowel preparations, such as Miralax, to help with bowel movements.  Narcotics can be constipating.  Be sure to drink plenty of fluids    STOP SMOKING OR USING NICOTINE PRODUCTS!!!!  As discussed nicotine severely impairs your body's ability to heal surgical and traumatic wounds but also impairs bone healing.  Wounds and bone heal by forming microscopic blood vessels (angiogenesis) and nicotine is a vasoconstrictor (essentially, shrinks blood vessels).  Therefore, if vasoconstriction occurs to these microscopic blood vessels they essentially disappear and are unable to deliver necessary nutrients to the healing tissue.  This is one modifiable factor that you can do to dramatically increase your chances of healing your injury.    (This means no smoking, no nicotine gum, patches, etc)  DO NOT USE NONSTEROIDAL ANTI-INFLAMMATORY DRUGS (NSAID'S)  Using products such as Advil (ibuprofen), Aleve (naproxen), Motrin (ibuprofen) for additional pain control during fracture healing can delay and/or prevent the healing response.  If you would like to take over the counter (OTC) medication, Tylenol (acetaminophen) is ok.  However, some narcotic medications that are given for pain control contain acetaminophen as well. Therefore, you should not exceed more than 4000 mg of tylenol in a day if you do not have liver disease.  Also note that there are may OTC medicines, such as cold medicines and allergy medicines that my contain tylenol as well.  If you have any questions about medications and/or interactions please ask your doctor/PA or your  pharmacist.      ICE AND ELEVATE INJURED/OPERATIVE EXTREMITY  Using ice and elevating the injured extremity above your heart can help with swelling and pain control.  Icing in a pulsatile fashion, such as 20 minutes on and 20 minutes off, can be followed.    Do not place ice directly on skin. Make sure there is a barrier between to skin and the ice pack.    Using frozen items such as frozen peas works well as the conform nicely to the are that needs to be iced.  USE AN ACE WRAP OR TED HOSE FOR SWELLING CONTROL  In addition to icing and elevation, Ace wraps or TED hose are used to help limit and resolve swelling.  It is recommended to use Ace wraps or TED hose until you are informed to stop.    When using Ace Wraps start the wrapping distally (farthest away from the body) and wrap proximally (closer to the body)  Example: If you had surgery on your leg or thing and you do not have a splint on, start the ace wrap at the toes and work your way up to the thigh        If you had surgery on your upper extremity and do not have a splint on, start the ace wrap at your fingers and work your way up to the upper arm  IF YOU ARE IN A SPLINT OR CAST DO NOT Slick   If your splint gets wet for any reason please contact the office immediately. You may shower in your splint or cast as long as you keep it dry.  This can be done by wrapping in a cast cover or garbage back (or similar)  Do Not stick any thing down your splint or cast such as pencils, money, or hangers to try and scratch yourself with.  If you feel itchy take benadryl as prescribed on the bottle for itching  IF YOU ARE IN A CAM BOOT (BLACK BOOT)  You may remove boot periodically. Perform daily dressing changes as noted below.  Wash the liner of the boot regularly and wear a sock when wearing the boot. It is recommended that you sleep in the boot until told otherwise  CALL THE OFFICE WITH ANY QUESTIONS OR CONCERNS: [phone]    Do not put a pillow under the knee. Place it under the heel.    Complete by:  As directed    Driving restrictions    Complete by:  As directed    No driving   Increase activity slowly as tolerated    Complete by:  As directed        Medication List    STOP taking these medications   metFORMIN 1000 MG tablet Commonly known as:  GLUCOPHAGE     TAKE these medications   acetaminophen 500 MG tablet Commonly known as:  TYLENOL Take 2 tablets (1,000 mg total) by mouth every 6 (six) hours.   ascorbic acid 500 MG tablet Commonly known as:  VITAMIN C Take 1 tablet (500 mg total) by mouth daily. Start taking on:  03/09/2016   blood glucose meter kit and supplies Kit Dispense based on patient and insurance preference. Use up to four times daily as directed. (FOR ICD-9 250.00, 250.01).   calcium citrate 950 MG tablet Commonly known as:  CALCITRATE - dosed in mg elemental calcium Take 1 tablet (200 mg of elemental calcium total) by mouth 2 (two) times daily.   Cholecalciferol 2000 units Tabs Take 2 tablets (4,000 Units total) by mouth daily.   docusate sodium 100 MG capsule Commonly known as:  COLACE Take 1 capsule (100 mg total) by mouth 2 (two) times daily.   enoxaparin 40 MG/0.4ML injection Commonly known as:  LOVENOX Inject 0.4 mLs (40 mg total) into the skin daily. Start taking on:  03/09/2016   insulin aspart 100 UNIT/ML injection Commonly known as:  novoLOG Inject 6 Units into the skin 3 (three) times daily before meals.   insulin glargine 100 UNIT/ML injection Commonly known as:  LANTUS Inject 0.18 mLs (18 Units total) into the skin at bedtime.   Insulin Syringes (Disposable) U-100 0.3 ML Misc Inject 18 Units into the skin at bedtime.   Insulin Syringes (Disposable) U-100 0.3 ML Misc Inject 6 Units into the skin 3 (three) times daily with meals.   methocarbamol 500 MG tablet Commonly known as:  ROBAXIN Take 1-2 tablets (500-1,000 mg total)  by mouth every 6 (six)  hours as needed for muscle spasms.   oxyCODONE 5 MG immediate release tablet Commonly known as:  Oxy IR/ROXICODONE Take 1-2 tablets (5-10 mg total) by mouth every 3 (three) hours as needed for moderate pain, severe pain or breakthrough pain.      Follow-up Information    HANDY,MICHAEL H, MD. Schedule an appointment as soon as possible for a visit in 2 week(s).   Specialty:  Orthopedic Surgery Contact information: [address] [place] [address] [phone]           Discharge Instructions and Plan:  50 y/o male s/p fall off ladder (18f) with complex bicondylar R tibial plateau fracture    - Fall of ladder   - R bicondylar tibial plateau fx with severe lateral joint depression             S/P  ORIF and RIA harvest from L femur               NWB Right Lower Extremity               WBAT Left leg               Unrestricted ROM R knee                         Hinged knee brace unlocked                         Can be out of hinge for ROM                         Brace only needs to be on for mobilization               Ice and elevate             bone foam               Dressing changes as needed             Ok to start showering in 2 days, clean wounds with soap and water    - Pain management:      Continue with current regimen               Tylenol 1000 mg po q6h scheduled             Oxy IR 5-10 mg po q3h prn pain             Robaxin 500 mg po q6h prn spasms      - ABL anemia/Hemodynamics            stable              Pt is jehovah's witness so no blood products      - Medical issues               DM                         Appreciate medicine assistance with management                           Critical sugars remain in check to prevent complications  Discharge regimen:                          lantus 18 units at bedtime                          novolog 6 units with meals                                          Fever                        afebrile x 48 hours                             - DVT/PE prophylaxis:             Lovenox x21 days post op    - ID:              periop ancef     - Metabolic Bone Disease:             vitamin d deficiency, testosterone deficiency/hypogodadal osteoporosis- likely related to uncontrolled DM             Will need vitamin d supplementation, calcium supplementation             Recheck testosterone levels once vitamin D corrected                  Anticipate starting forteo therapy post-op             Will absolutely need DEXA as outpt    - Activity:             NWB R leg    - FEN/GI prophylaxis/Foley/Lines:             carb mod diet      - Impediments to fracture healing:             DIABETES!!             Vitamin d deficiency              Testosterone deficiency    - Dispo:             therapy             Continue with tight sugar control                      Dc home today             Follow up with ortho in 10-14 days             Follow up with pcp in 1week  Signed:  Jari Pigg, PA-C [facility] [phone] (P) 03/08/2016, 3:36 PM

## 2016-03-10 DIAGNOSIS — S82142D Displaced bicondylar fracture of left tibia, subsequent encounter for closed fracture with routine healing: Secondary | ICD-10-CM | POA: Diagnosis not present

## 2016-03-10 DIAGNOSIS — Z794 Long term (current) use of insulin: Secondary | ICD-10-CM | POA: Diagnosis not present

## 2016-03-10 DIAGNOSIS — E119 Type 2 diabetes mellitus without complications: Secondary | ICD-10-CM | POA: Diagnosis not present

## 2016-03-10 DIAGNOSIS — Z9181 History of falling: Secondary | ICD-10-CM | POA: Diagnosis not present

## 2016-03-12 DIAGNOSIS — Z9181 History of falling: Secondary | ICD-10-CM | POA: Diagnosis not present

## 2016-03-12 DIAGNOSIS — E119 Type 2 diabetes mellitus without complications: Secondary | ICD-10-CM | POA: Diagnosis not present

## 2016-03-12 DIAGNOSIS — S82142D Displaced bicondylar fracture of left tibia, subsequent encounter for closed fracture with routine healing: Secondary | ICD-10-CM | POA: Diagnosis not present

## 2016-03-12 DIAGNOSIS — Z794 Long term (current) use of insulin: Secondary | ICD-10-CM | POA: Diagnosis not present

## 2016-03-17 DIAGNOSIS — Z794 Long term (current) use of insulin: Secondary | ICD-10-CM | POA: Diagnosis not present

## 2016-03-17 DIAGNOSIS — S82142D Displaced bicondylar fracture of left tibia, subsequent encounter for closed fracture with routine healing: Secondary | ICD-10-CM | POA: Diagnosis not present

## 2016-03-17 DIAGNOSIS — E1169 Type 2 diabetes mellitus with other specified complication: Secondary | ICD-10-CM | POA: Diagnosis not present

## 2016-03-17 DIAGNOSIS — E785 Hyperlipidemia, unspecified: Secondary | ICD-10-CM | POA: Diagnosis not present

## 2016-03-17 DIAGNOSIS — Z79899 Other long term (current) drug therapy: Secondary | ICD-10-CM | POA: Diagnosis not present

## 2016-03-17 DIAGNOSIS — Z9181 History of falling: Secondary | ICD-10-CM | POA: Diagnosis not present

## 2016-03-17 DIAGNOSIS — E119 Type 2 diabetes mellitus without complications: Secondary | ICD-10-CM | POA: Diagnosis not present

## 2016-03-18 DIAGNOSIS — D473 Essential (hemorrhagic) thrombocythemia: Secondary | ICD-10-CM | POA: Diagnosis not present

## 2016-03-19 DIAGNOSIS — S82142D Displaced bicondylar fracture of left tibia, subsequent encounter for closed fracture with routine healing: Secondary | ICD-10-CM | POA: Diagnosis not present

## 2016-03-19 DIAGNOSIS — Z9181 History of falling: Secondary | ICD-10-CM | POA: Diagnosis not present

## 2016-03-19 DIAGNOSIS — E119 Type 2 diabetes mellitus without complications: Secondary | ICD-10-CM | POA: Diagnosis not present

## 2016-03-19 DIAGNOSIS — Z794 Long term (current) use of insulin: Secondary | ICD-10-CM | POA: Diagnosis not present

## 2016-03-22 DIAGNOSIS — S82251D Displaced comminuted fracture of shaft of right tibia, subsequent encounter for closed fracture with routine healing: Secondary | ICD-10-CM | POA: Diagnosis not present

## 2016-03-22 DIAGNOSIS — S82141D Displaced bicondylar fracture of right tibia, subsequent encounter for closed fracture with routine healing: Secondary | ICD-10-CM | POA: Diagnosis not present

## 2016-03-22 DIAGNOSIS — Z9181 History of falling: Secondary | ICD-10-CM | POA: Diagnosis not present

## 2016-03-22 DIAGNOSIS — Z794 Long term (current) use of insulin: Secondary | ICD-10-CM | POA: Diagnosis not present

## 2016-03-22 DIAGNOSIS — E119 Type 2 diabetes mellitus without complications: Secondary | ICD-10-CM | POA: Diagnosis not present

## 2016-03-22 DIAGNOSIS — S82142D Displaced bicondylar fracture of left tibia, subsequent encounter for closed fracture with routine healing: Secondary | ICD-10-CM | POA: Diagnosis not present

## 2016-03-24 DIAGNOSIS — E119 Type 2 diabetes mellitus without complications: Secondary | ICD-10-CM | POA: Diagnosis not present

## 2016-03-24 DIAGNOSIS — S82142D Displaced bicondylar fracture of left tibia, subsequent encounter for closed fracture with routine healing: Secondary | ICD-10-CM | POA: Diagnosis not present

## 2016-03-24 DIAGNOSIS — Z9181 History of falling: Secondary | ICD-10-CM | POA: Diagnosis not present

## 2016-03-24 DIAGNOSIS — Z794 Long term (current) use of insulin: Secondary | ICD-10-CM | POA: Diagnosis not present

## 2016-03-26 DIAGNOSIS — E119 Type 2 diabetes mellitus without complications: Secondary | ICD-10-CM | POA: Diagnosis not present

## 2016-03-26 DIAGNOSIS — Z9181 History of falling: Secondary | ICD-10-CM | POA: Diagnosis not present

## 2016-03-26 DIAGNOSIS — Z794 Long term (current) use of insulin: Secondary | ICD-10-CM | POA: Diagnosis not present

## 2016-03-26 DIAGNOSIS — S82142D Displaced bicondylar fracture of left tibia, subsequent encounter for closed fracture with routine healing: Secondary | ICD-10-CM | POA: Diagnosis not present

## 2016-03-29 DIAGNOSIS — S82142D Displaced bicondylar fracture of left tibia, subsequent encounter for closed fracture with routine healing: Secondary | ICD-10-CM | POA: Diagnosis not present

## 2016-03-29 DIAGNOSIS — Z794 Long term (current) use of insulin: Secondary | ICD-10-CM | POA: Diagnosis not present

## 2016-03-29 DIAGNOSIS — E119 Type 2 diabetes mellitus without complications: Secondary | ICD-10-CM | POA: Diagnosis not present

## 2016-03-29 DIAGNOSIS — Z9181 History of falling: Secondary | ICD-10-CM | POA: Diagnosis not present

## 2016-03-29 NOTE — Op Note (Signed)
NAMEPLUTARCO, MATUSEK                 ACCOUNT NO.:  [id number]  MEDICAL RECORD NO.:  [id number]  LOCATION:  5N03C                        FACILITY:  [facility]  PHYSICIAN:  Astrid Divine. Marcelino Scot, M.D. DATE OF BIRTH:  1966/03/06  DATE OF PROCEDURE:  03/04/2016 DATE OF DISCHARGE:  03/08/2016                              OPERATIVE REPORT   PREOPERATIVE DIAGNOSES: 1. Right bicondylar tibial plateau fracture. 2. Right tibial shaft fracture. 3. Suspected right lateral meniscus tear. 4. Retained external fixator right leg.  POSTOPERATIVE DIAGNOSES: 1. Right bicondylar tibial plateau fracture. 2. Right tibial shaft fracture. 3. Per right lateral meniscus avulsion, 70% of the meniscus including     all of the midbody and anterior horn; also a mid body inner surface     tear. 4. Retained external fixator right leg with ulcerated pin sites.  PROCEDURES: 1. ORIF of right bicondylar tibial plateau fracture. 2. ORIF of right tibial shaft fracture. 3. Arthrotomy with repair of the right lateral meniscus avulsion     including all the mid body and anterior horn and also a partial     lateral meniscectomy of the midbody tear. 4. Removal of external fixator and anesthesia in right leg. 5. Major bone grafting from the left femur using reamed intramedullary     aspiration (RIA) and 6 curettage and debridement of ulcerated pin     sites femur and tibia.  SURGEON:  Astrid Divine. Marcelino Scot, M.D.  ASSISTANT:  RNFA.  ANESTHESIA:  General.  COMPLICATIONS:  None.  TOURNIQUET:  None.  DISPOSITION:  To PACU.  CONDITION:  Stable.  BRIEF SUMMARY OF INDICATIONS FOR PROCEDURE:  Lawrence [name] is a very pleasant 50 year old male with a severe right lower extremity injury, initially seen and evaluated by the surgeon on call Dr. Earlie Counts, who recognized the scope in severity of these injuries and asserted that they were outside of his practice and required management by a fellowship trained orthopedic  traumatologist.  Consequently, he consulted me to provide for further evaluation and definitive management.  At the request of Dr. Veverly Fells, I saw the patient who indeed had a very severe fracture such that I recommended primary large scale bone grafting from the contralateral femur and suspected a lateral meniscus tear that should be evaluated and repaired at the same time.  I discussed the potential for nerve injury, vessel injury, arthritis, loss of motion, DVT, PE, fracture from the femoral grafting, and potential pin site infection from the fixator pin sites, which were somewhat ulcerated despite the short duration.  The patient and his family acknowledged these risks and strongly wished to proceed.  BRIEF SUMMARY OF PROCEDURE:  The patient was taken to the operating room, where general anesthesia was induced.  He did receive preoperative antibiotics.  The fixator was retained and cleaned, prepped and draped in usual sterile fashion.  At that point, I brought in some towels to lay underneath the fixture, proceeded with removal of the components rescrubbed the leg and then performed a curettage sequentially of the skin, subcutaneous tissue, muscle, fascia, and bone.  Ultimately, this was then thoroughly irrigated and pin sites dressed out with Ioban and gauze to prevent any contamination of  the field.  A standard curvilinear incision was then made to allow approach to the tibial plateau fracture. Care was able to expose the fracture site and mobilized the major fragments.  There was severe destruction and impaction well over 50 mm down into the metaphysis and almost down into the shaft.  There was complete avulsion of the lateral meniscus from the midbody and including the entire anterior horn, pushed down into the fracture site.  A small area remained intact back posterior and medial to the popliteal fossa. The edges of the meniscus were clean and secured with 0 Prolene.  There was  also an inner 3rd tear along the midbody.  This was cut sharply with a Mayo scissor back to a stable, smooth edge, and then the repair continued with vertical mattress 0 Prolene sutures from anterior horn all the way back to the popliteal fossa.  Once this was secured, I mobilize the individual fragments of the medial lateral plateaus putting them into position and then bringing up some of the crushed bone below to tamp it into place.  It was fixed provisionally with K-wires and then attention turned to the large shaft components of the fracture.  This was mobilized into correct alignment.  Rather than place 2 plates, I placed a lateral plate in position along the plateau securing it again with K-wires through the plate and then distally using open manipulation to get the shaft reduced to bring it to the plate.  This was secured with standard fixation distally and proximally to maximally appose the plate both to the plateau fractures as well as to the shaft fracture and then this was followed with additional screw fixation both proximally and distally bridging the meta diaphysis.  Once this was complete, I turned my attention to the left femur having established that the defect was indeed well over 40 mL.  A 3 cm incision was made proximal to the greater trochanter and the starting pin advanced into the proximal femur.  The was followed by the reamer aspirator and 50 to 60 mL of graft were harvested and this was performed in stepwise fashion going from the proximal femur all the way down into the supracondylar region. We did not encounter any complications and had a very healthy looking graft.  This was placed into the defect and then through a small cortical window, which was then closed keeping the graft within and then the wounds all irrigated and closed in standard layered fashion using 0 Vicryl, 2-0 Vicryl, and 3-0 nylon.  Sterile gently compressive dressing was applied from foot to  knee and then the patient was taken to the PACU in stable condition.  PROGNOSIS:  Mr. Panagopoulos will be strictly nonweightbearing on the right with weightbearing as tolerated on the left with aggressive early motion on the severity of his fracture involving both the plateau and shaft increase his chances of arthritis as well as nonunion.  With also some risk for fracture from the left femur from the harvest, but we did not jeopardize his cortices with the size reamer selected and are hopeful for improvement in healing rate with regard to his plateau.  The meniscal injury was a very severe as well, but was repaired and were hopeful they will have a complete take and healing.     Astrid Divine. Marcelino Scot, M.D.     MHH/MEDQ  D:  03/28/2016  T:  03/29/2016  Job:  [id number]

## 2016-03-30 DIAGNOSIS — E119 Type 2 diabetes mellitus without complications: Secondary | ICD-10-CM | POA: Diagnosis not present

## 2016-03-30 DIAGNOSIS — S82142D Displaced bicondylar fracture of left tibia, subsequent encounter for closed fracture with routine healing: Secondary | ICD-10-CM | POA: Diagnosis not present

## 2016-03-30 DIAGNOSIS — Z794 Long term (current) use of insulin: Secondary | ICD-10-CM | POA: Diagnosis not present

## 2016-03-30 DIAGNOSIS — Z9181 History of falling: Secondary | ICD-10-CM | POA: Diagnosis not present

## 2016-03-31 DIAGNOSIS — Z794 Long term (current) use of insulin: Secondary | ICD-10-CM | POA: Diagnosis not present

## 2016-03-31 DIAGNOSIS — E119 Type 2 diabetes mellitus without complications: Secondary | ICD-10-CM | POA: Diagnosis not present

## 2016-03-31 DIAGNOSIS — S82143A Displaced bicondylar fracture of unspecified tibia, initial encounter for closed fracture: Secondary | ICD-10-CM | POA: Diagnosis not present

## 2016-03-31 DIAGNOSIS — Z9181 History of falling: Secondary | ICD-10-CM | POA: Diagnosis not present

## 2016-03-31 DIAGNOSIS — S82142D Displaced bicondylar fracture of left tibia, subsequent encounter for closed fracture with routine healing: Secondary | ICD-10-CM | POA: Diagnosis not present

## 2016-04-02 DIAGNOSIS — Z9181 History of falling: Secondary | ICD-10-CM | POA: Diagnosis not present

## 2016-04-02 DIAGNOSIS — Z794 Long term (current) use of insulin: Secondary | ICD-10-CM | POA: Diagnosis not present

## 2016-04-02 DIAGNOSIS — E119 Type 2 diabetes mellitus without complications: Secondary | ICD-10-CM | POA: Diagnosis not present

## 2016-04-02 DIAGNOSIS — D473 Essential (hemorrhagic) thrombocythemia: Secondary | ICD-10-CM | POA: Diagnosis not present

## 2016-04-02 DIAGNOSIS — S82142D Displaced bicondylar fracture of left tibia, subsequent encounter for closed fracture with routine healing: Secondary | ICD-10-CM | POA: Diagnosis not present

## 2016-04-04 DIAGNOSIS — Z794 Long term (current) use of insulin: Secondary | ICD-10-CM | POA: Diagnosis not present

## 2016-04-04 DIAGNOSIS — E119 Type 2 diabetes mellitus without complications: Secondary | ICD-10-CM | POA: Diagnosis not present

## 2016-04-04 DIAGNOSIS — Z9181 History of falling: Secondary | ICD-10-CM | POA: Diagnosis not present

## 2016-04-04 DIAGNOSIS — S82142D Displaced bicondylar fracture of left tibia, subsequent encounter for closed fracture with routine healing: Secondary | ICD-10-CM | POA: Diagnosis not present

## 2016-04-05 DIAGNOSIS — Z9181 History of falling: Secondary | ICD-10-CM | POA: Diagnosis not present

## 2016-04-05 DIAGNOSIS — Z794 Long term (current) use of insulin: Secondary | ICD-10-CM | POA: Diagnosis not present

## 2016-04-05 DIAGNOSIS — S82142D Displaced bicondylar fracture of left tibia, subsequent encounter for closed fracture with routine healing: Secondary | ICD-10-CM | POA: Diagnosis not present

## 2016-04-05 DIAGNOSIS — E119 Type 2 diabetes mellitus without complications: Secondary | ICD-10-CM | POA: Diagnosis not present

## 2016-04-07 DIAGNOSIS — S82141D Displaced bicondylar fracture of right tibia, subsequent encounter for closed fracture with routine healing: Secondary | ICD-10-CM | POA: Diagnosis not present

## 2016-04-07 DIAGNOSIS — E119 Type 2 diabetes mellitus without complications: Secondary | ICD-10-CM | POA: Diagnosis not present

## 2016-04-07 DIAGNOSIS — Z794 Long term (current) use of insulin: Secondary | ICD-10-CM | POA: Diagnosis not present

## 2016-04-07 DIAGNOSIS — S82251D Displaced comminuted fracture of shaft of right tibia, subsequent encounter for closed fracture with routine healing: Secondary | ICD-10-CM | POA: Diagnosis not present

## 2016-04-07 DIAGNOSIS — S82142D Displaced bicondylar fracture of left tibia, subsequent encounter for closed fracture with routine healing: Secondary | ICD-10-CM | POA: Diagnosis not present

## 2016-04-07 DIAGNOSIS — Z9181 History of falling: Secondary | ICD-10-CM | POA: Diagnosis not present

## 2016-04-08 DIAGNOSIS — S82142D Displaced bicondylar fracture of left tibia, subsequent encounter for closed fracture with routine healing: Secondary | ICD-10-CM | POA: Diagnosis not present

## 2016-04-08 DIAGNOSIS — Z794 Long term (current) use of insulin: Secondary | ICD-10-CM | POA: Diagnosis not present

## 2016-04-08 DIAGNOSIS — Z9181 History of falling: Secondary | ICD-10-CM | POA: Diagnosis not present

## 2016-04-08 DIAGNOSIS — E119 Type 2 diabetes mellitus without complications: Secondary | ICD-10-CM | POA: Diagnosis not present

## 2016-04-09 DIAGNOSIS — Z9181 History of falling: Secondary | ICD-10-CM | POA: Diagnosis not present

## 2016-04-09 DIAGNOSIS — Z794 Long term (current) use of insulin: Secondary | ICD-10-CM | POA: Diagnosis not present

## 2016-04-09 DIAGNOSIS — E119 Type 2 diabetes mellitus without complications: Secondary | ICD-10-CM | POA: Diagnosis not present

## 2016-04-09 DIAGNOSIS — S82142D Displaced bicondylar fracture of left tibia, subsequent encounter for closed fracture with routine healing: Secondary | ICD-10-CM | POA: Diagnosis not present

## 2016-04-12 DIAGNOSIS — S82142D Displaced bicondylar fracture of left tibia, subsequent encounter for closed fracture with routine healing: Secondary | ICD-10-CM | POA: Diagnosis not present

## 2016-04-12 DIAGNOSIS — Z794 Long term (current) use of insulin: Secondary | ICD-10-CM | POA: Diagnosis not present

## 2016-04-12 DIAGNOSIS — E119 Type 2 diabetes mellitus without complications: Secondary | ICD-10-CM | POA: Diagnosis not present

## 2016-04-12 DIAGNOSIS — Z9181 History of falling: Secondary | ICD-10-CM | POA: Diagnosis not present

## 2016-04-13 DIAGNOSIS — M21371 Foot drop, right foot: Secondary | ICD-10-CM | POA: Diagnosis not present

## 2016-04-14 DIAGNOSIS — E119 Type 2 diabetes mellitus without complications: Secondary | ICD-10-CM | POA: Diagnosis not present

## 2016-04-14 DIAGNOSIS — Z9181 History of falling: Secondary | ICD-10-CM | POA: Diagnosis not present

## 2016-04-14 DIAGNOSIS — Z794 Long term (current) use of insulin: Secondary | ICD-10-CM | POA: Diagnosis not present

## 2016-04-14 DIAGNOSIS — S82142D Displaced bicondylar fracture of left tibia, subsequent encounter for closed fracture with routine healing: Secondary | ICD-10-CM | POA: Diagnosis not present

## 2016-04-16 DIAGNOSIS — Z9181 History of falling: Secondary | ICD-10-CM | POA: Diagnosis not present

## 2016-04-16 DIAGNOSIS — S82142D Displaced bicondylar fracture of left tibia, subsequent encounter for closed fracture with routine healing: Secondary | ICD-10-CM | POA: Diagnosis not present

## 2016-04-16 DIAGNOSIS — E119 Type 2 diabetes mellitus without complications: Secondary | ICD-10-CM | POA: Diagnosis not present

## 2016-04-16 DIAGNOSIS — Z794 Long term (current) use of insulin: Secondary | ICD-10-CM | POA: Diagnosis not present

## 2016-04-19 DIAGNOSIS — E1169 Type 2 diabetes mellitus with other specified complication: Secondary | ICD-10-CM | POA: Diagnosis not present

## 2016-04-19 DIAGNOSIS — Z794 Long term (current) use of insulin: Secondary | ICD-10-CM | POA: Diagnosis not present

## 2016-04-19 DIAGNOSIS — E119 Type 2 diabetes mellitus without complications: Secondary | ICD-10-CM | POA: Diagnosis not present

## 2016-04-19 DIAGNOSIS — E785 Hyperlipidemia, unspecified: Secondary | ICD-10-CM | POA: Diagnosis not present

## 2016-04-19 DIAGNOSIS — D649 Anemia, unspecified: Secondary | ICD-10-CM | POA: Diagnosis not present

## 2016-04-19 DIAGNOSIS — S82142D Displaced bicondylar fracture of left tibia, subsequent encounter for closed fracture with routine healing: Secondary | ICD-10-CM | POA: Diagnosis not present

## 2016-04-19 DIAGNOSIS — Z79899 Other long term (current) drug therapy: Secondary | ICD-10-CM | POA: Diagnosis not present

## 2016-04-19 DIAGNOSIS — D473 Essential (hemorrhagic) thrombocythemia: Secondary | ICD-10-CM | POA: Diagnosis not present

## 2016-04-19 DIAGNOSIS — Z9181 History of falling: Secondary | ICD-10-CM | POA: Diagnosis not present

## 2016-04-21 DIAGNOSIS — Z9181 History of falling: Secondary | ICD-10-CM | POA: Diagnosis not present

## 2016-04-21 DIAGNOSIS — Z794 Long term (current) use of insulin: Secondary | ICD-10-CM | POA: Diagnosis not present

## 2016-04-21 DIAGNOSIS — S82142D Displaced bicondylar fracture of left tibia, subsequent encounter for closed fracture with routine healing: Secondary | ICD-10-CM | POA: Diagnosis not present

## 2016-04-21 DIAGNOSIS — E119 Type 2 diabetes mellitus without complications: Secondary | ICD-10-CM | POA: Diagnosis not present

## 2016-04-23 DIAGNOSIS — E119 Type 2 diabetes mellitus without complications: Secondary | ICD-10-CM | POA: Diagnosis not present

## 2016-04-23 DIAGNOSIS — Z794 Long term (current) use of insulin: Secondary | ICD-10-CM | POA: Diagnosis not present

## 2016-04-23 DIAGNOSIS — Z9181 History of falling: Secondary | ICD-10-CM | POA: Diagnosis not present

## 2016-04-23 DIAGNOSIS — S82142D Displaced bicondylar fracture of left tibia, subsequent encounter for closed fracture with routine healing: Secondary | ICD-10-CM | POA: Diagnosis not present

## 2016-04-26 DIAGNOSIS — S82251D Displaced comminuted fracture of shaft of right tibia, subsequent encounter for closed fracture with routine healing: Secondary | ICD-10-CM | POA: Diagnosis not present

## 2016-04-26 DIAGNOSIS — S82141D Displaced bicondylar fracture of right tibia, subsequent encounter for closed fracture with routine healing: Secondary | ICD-10-CM | POA: Diagnosis not present

## 2016-05-01 DIAGNOSIS — S82143A Displaced bicondylar fracture of unspecified tibia, initial encounter for closed fracture: Secondary | ICD-10-CM | POA: Diagnosis not present

## 2016-05-04 DIAGNOSIS — D473 Essential (hemorrhagic) thrombocythemia: Secondary | ICD-10-CM | POA: Diagnosis not present

## 2016-05-04 DIAGNOSIS — D696 Thrombocytopenia, unspecified: Secondary | ICD-10-CM | POA: Diagnosis not present

## 2016-05-11 DIAGNOSIS — M6281 Muscle weakness (generalized): Secondary | ICD-10-CM | POA: Diagnosis not present

## 2016-05-11 DIAGNOSIS — M21371 Foot drop, right foot: Secondary | ICD-10-CM | POA: Diagnosis not present

## 2016-05-11 DIAGNOSIS — M25561 Pain in right knee: Secondary | ICD-10-CM | POA: Diagnosis not present

## 2016-05-11 DIAGNOSIS — R2689 Other abnormalities of gait and mobility: Secondary | ICD-10-CM | POA: Diagnosis not present

## 2016-05-19 DIAGNOSIS — S82251D Displaced comminuted fracture of shaft of right tibia, subsequent encounter for closed fracture with routine healing: Secondary | ICD-10-CM | POA: Diagnosis not present

## 2016-05-19 DIAGNOSIS — S82141D Displaced bicondylar fracture of right tibia, subsequent encounter for closed fracture with routine healing: Secondary | ICD-10-CM | POA: Diagnosis not present

## 2016-05-31 DIAGNOSIS — S82143A Displaced bicondylar fracture of unspecified tibia, initial encounter for closed fracture: Secondary | ICD-10-CM | POA: Diagnosis not present

## 2016-06-08 DIAGNOSIS — M25561 Pain in right knee: Secondary | ICD-10-CM | POA: Diagnosis not present

## 2016-06-08 DIAGNOSIS — M6281 Muscle weakness (generalized): Secondary | ICD-10-CM | POA: Diagnosis not present

## 2016-06-08 DIAGNOSIS — R2689 Other abnormalities of gait and mobility: Secondary | ICD-10-CM | POA: Diagnosis not present

## 2016-06-08 DIAGNOSIS — M21371 Foot drop, right foot: Secondary | ICD-10-CM | POA: Diagnosis not present

## 2016-06-15 DIAGNOSIS — M6281 Muscle weakness (generalized): Secondary | ICD-10-CM | POA: Diagnosis not present

## 2016-06-15 DIAGNOSIS — E1169 Type 2 diabetes mellitus with other specified complication: Secondary | ICD-10-CM | POA: Diagnosis not present

## 2016-06-15 DIAGNOSIS — M25561 Pain in right knee: Secondary | ICD-10-CM | POA: Diagnosis not present

## 2016-06-15 DIAGNOSIS — Z1389 Encounter for screening for other disorder: Secondary | ICD-10-CM | POA: Diagnosis not present

## 2016-06-15 DIAGNOSIS — Z1211 Encounter for screening for malignant neoplasm of colon: Secondary | ICD-10-CM | POA: Diagnosis not present

## 2016-06-15 DIAGNOSIS — Z0000 Encounter for general adult medical examination without abnormal findings: Secondary | ICD-10-CM | POA: Diagnosis not present

## 2016-06-15 DIAGNOSIS — R2689 Other abnormalities of gait and mobility: Secondary | ICD-10-CM | POA: Diagnosis not present

## 2016-06-15 DIAGNOSIS — M21371 Foot drop, right foot: Secondary | ICD-10-CM | POA: Diagnosis not present

## 2016-06-15 DIAGNOSIS — Z125 Encounter for screening for malignant neoplasm of prostate: Secondary | ICD-10-CM | POA: Diagnosis not present

## 2016-06-15 DIAGNOSIS — Z1212 Encounter for screening for malignant neoplasm of rectum: Secondary | ICD-10-CM | POA: Diagnosis not present

## 2016-06-15 DIAGNOSIS — E785 Hyperlipidemia, unspecified: Secondary | ICD-10-CM | POA: Diagnosis not present

## 2016-06-17 DIAGNOSIS — H25041 Posterior subcapsular polar age-related cataract, right eye: Secondary | ICD-10-CM | POA: Diagnosis not present

## 2016-06-17 DIAGNOSIS — H11153 Pinguecula, bilateral: Secondary | ICD-10-CM | POA: Diagnosis not present

## 2016-06-17 DIAGNOSIS — H11001 Unspecified pterygium of right eye: Secondary | ICD-10-CM | POA: Diagnosis not present

## 2016-06-17 DIAGNOSIS — E113413 Type 2 diabetes mellitus with severe nonproliferative diabetic retinopathy with macular edema, bilateral: Secondary | ICD-10-CM | POA: Diagnosis not present

## 2016-06-22 DIAGNOSIS — D649 Anemia, unspecified: Secondary | ICD-10-CM | POA: Diagnosis not present

## 2016-06-22 DIAGNOSIS — E1169 Type 2 diabetes mellitus with other specified complication: Secondary | ICD-10-CM | POA: Diagnosis not present

## 2016-06-22 DIAGNOSIS — D473 Essential (hemorrhagic) thrombocythemia: Secondary | ICD-10-CM | POA: Diagnosis not present

## 2016-06-22 DIAGNOSIS — E785 Hyperlipidemia, unspecified: Secondary | ICD-10-CM | POA: Diagnosis not present

## 2016-06-22 DIAGNOSIS — M25561 Pain in right knee: Secondary | ICD-10-CM | POA: Diagnosis not present

## 2016-06-22 DIAGNOSIS — M6281 Muscle weakness (generalized): Secondary | ICD-10-CM | POA: Diagnosis not present

## 2016-06-22 DIAGNOSIS — R2689 Other abnormalities of gait and mobility: Secondary | ICD-10-CM | POA: Diagnosis not present

## 2016-06-22 DIAGNOSIS — M21371 Foot drop, right foot: Secondary | ICD-10-CM | POA: Diagnosis not present

## 2016-06-29 DIAGNOSIS — M25561 Pain in right knee: Secondary | ICD-10-CM | POA: Diagnosis not present

## 2016-06-29 DIAGNOSIS — M6281 Muscle weakness (generalized): Secondary | ICD-10-CM | POA: Diagnosis not present

## 2016-06-29 DIAGNOSIS — R2689 Other abnormalities of gait and mobility: Secondary | ICD-10-CM | POA: Diagnosis not present

## 2016-06-29 DIAGNOSIS — M21371 Foot drop, right foot: Secondary | ICD-10-CM | POA: Diagnosis not present

## 2016-06-30 DIAGNOSIS — S82251D Displaced comminuted fracture of shaft of right tibia, subsequent encounter for closed fracture with routine healing: Secondary | ICD-10-CM | POA: Diagnosis not present

## 2016-06-30 DIAGNOSIS — S82141D Displaced bicondylar fracture of right tibia, subsequent encounter for closed fracture with routine healing: Secondary | ICD-10-CM | POA: Diagnosis not present

## 2016-07-01 DIAGNOSIS — S82143A Displaced bicondylar fracture of unspecified tibia, initial encounter for closed fracture: Secondary | ICD-10-CM | POA: Diagnosis not present

## 2016-07-06 DIAGNOSIS — R2689 Other abnormalities of gait and mobility: Secondary | ICD-10-CM | POA: Diagnosis not present

## 2016-07-06 DIAGNOSIS — M25561 Pain in right knee: Secondary | ICD-10-CM | POA: Diagnosis not present

## 2016-07-06 DIAGNOSIS — M21371 Foot drop, right foot: Secondary | ICD-10-CM | POA: Diagnosis not present

## 2016-07-06 DIAGNOSIS — M6281 Muscle weakness (generalized): Secondary | ICD-10-CM | POA: Diagnosis not present

## 2016-07-13 DIAGNOSIS — R2689 Other abnormalities of gait and mobility: Secondary | ICD-10-CM | POA: Diagnosis not present

## 2016-07-13 DIAGNOSIS — M6281 Muscle weakness (generalized): Secondary | ICD-10-CM | POA: Diagnosis not present

## 2016-07-13 DIAGNOSIS — M25561 Pain in right knee: Secondary | ICD-10-CM | POA: Diagnosis not present

## 2016-07-13 DIAGNOSIS — M21371 Foot drop, right foot: Secondary | ICD-10-CM | POA: Diagnosis not present

## 2016-07-14 DIAGNOSIS — S83281A Other tear of lateral meniscus, current injury, right knee, initial encounter: Secondary | ICD-10-CM | POA: Diagnosis not present

## 2016-07-28 DIAGNOSIS — S82251D Displaced comminuted fracture of shaft of right tibia, subsequent encounter for closed fracture with routine healing: Secondary | ICD-10-CM | POA: Diagnosis not present

## 2016-07-28 DIAGNOSIS — S82141D Displaced bicondylar fracture of right tibia, subsequent encounter for closed fracture with routine healing: Secondary | ICD-10-CM | POA: Diagnosis not present

## 2016-07-28 DIAGNOSIS — S83281D Other tear of lateral meniscus, current injury, right knee, subsequent encounter: Secondary | ICD-10-CM | POA: Diagnosis not present

## 2016-08-01 DIAGNOSIS — S82143A Displaced bicondylar fracture of unspecified tibia, initial encounter for closed fracture: Secondary | ICD-10-CM | POA: Diagnosis not present

## 2016-08-05 DIAGNOSIS — M25561 Pain in right knee: Secondary | ICD-10-CM | POA: Diagnosis not present

## 2016-08-05 DIAGNOSIS — M6281 Muscle weakness (generalized): Secondary | ICD-10-CM | POA: Diagnosis not present

## 2016-08-05 DIAGNOSIS — M21371 Foot drop, right foot: Secondary | ICD-10-CM | POA: Diagnosis not present

## 2016-08-05 DIAGNOSIS — R2689 Other abnormalities of gait and mobility: Secondary | ICD-10-CM | POA: Diagnosis not present

## 2016-08-18 DIAGNOSIS — M21371 Foot drop, right foot: Secondary | ICD-10-CM | POA: Diagnosis not present

## 2016-08-18 DIAGNOSIS — R2689 Other abnormalities of gait and mobility: Secondary | ICD-10-CM | POA: Diagnosis not present

## 2016-08-18 DIAGNOSIS — M25561 Pain in right knee: Secondary | ICD-10-CM | POA: Diagnosis not present

## 2016-08-18 DIAGNOSIS — M6281 Muscle weakness (generalized): Secondary | ICD-10-CM | POA: Diagnosis not present

## 2016-08-24 DIAGNOSIS — R2689 Other abnormalities of gait and mobility: Secondary | ICD-10-CM | POA: Diagnosis not present

## 2016-08-24 DIAGNOSIS — M6281 Muscle weakness (generalized): Secondary | ICD-10-CM | POA: Diagnosis not present

## 2016-08-24 DIAGNOSIS — M25561 Pain in right knee: Secondary | ICD-10-CM | POA: Diagnosis not present

## 2016-08-24 DIAGNOSIS — M21371 Foot drop, right foot: Secondary | ICD-10-CM | POA: Diagnosis not present

## 2016-08-29 DIAGNOSIS — S82143A Displaced bicondylar fracture of unspecified tibia, initial encounter for closed fracture: Secondary | ICD-10-CM | POA: Diagnosis not present

## 2016-08-31 DIAGNOSIS — M25561 Pain in right knee: Secondary | ICD-10-CM | POA: Diagnosis not present

## 2016-08-31 DIAGNOSIS — M6281 Muscle weakness (generalized): Secondary | ICD-10-CM | POA: Diagnosis not present

## 2016-08-31 DIAGNOSIS — R2689 Other abnormalities of gait and mobility: Secondary | ICD-10-CM | POA: Diagnosis not present

## 2016-08-31 DIAGNOSIS — M21371 Foot drop, right foot: Secondary | ICD-10-CM | POA: Diagnosis not present

## 2016-09-01 DIAGNOSIS — S82251D Displaced comminuted fracture of shaft of right tibia, subsequent encounter for closed fracture with routine healing: Secondary | ICD-10-CM | POA: Diagnosis not present

## 2016-09-01 DIAGNOSIS — S82141D Displaced bicondylar fracture of right tibia, subsequent encounter for closed fracture with routine healing: Secondary | ICD-10-CM | POA: Diagnosis not present

## 2016-09-07 DIAGNOSIS — M6281 Muscle weakness (generalized): Secondary | ICD-10-CM | POA: Diagnosis not present

## 2016-09-07 DIAGNOSIS — M25561 Pain in right knee: Secondary | ICD-10-CM | POA: Diagnosis not present

## 2016-09-07 DIAGNOSIS — M21371 Foot drop, right foot: Secondary | ICD-10-CM | POA: Diagnosis not present

## 2016-09-07 DIAGNOSIS — R2689 Other abnormalities of gait and mobility: Secondary | ICD-10-CM | POA: Diagnosis not present

## 2016-09-14 DIAGNOSIS — M6281 Muscle weakness (generalized): Secondary | ICD-10-CM | POA: Diagnosis not present

## 2016-09-14 DIAGNOSIS — R2689 Other abnormalities of gait and mobility: Secondary | ICD-10-CM | POA: Diagnosis not present

## 2016-09-14 DIAGNOSIS — M21371 Foot drop, right foot: Secondary | ICD-10-CM | POA: Diagnosis not present

## 2016-09-14 DIAGNOSIS — M25561 Pain in right knee: Secondary | ICD-10-CM | POA: Diagnosis not present

## 2016-09-21 DIAGNOSIS — M21371 Foot drop, right foot: Secondary | ICD-10-CM | POA: Diagnosis not present

## 2016-09-21 DIAGNOSIS — D473 Essential (hemorrhagic) thrombocythemia: Secondary | ICD-10-CM | POA: Diagnosis not present

## 2016-09-21 DIAGNOSIS — R2689 Other abnormalities of gait and mobility: Secondary | ICD-10-CM | POA: Diagnosis not present

## 2016-09-21 DIAGNOSIS — D649 Anemia, unspecified: Secondary | ICD-10-CM | POA: Diagnosis not present

## 2016-09-21 DIAGNOSIS — E785 Hyperlipidemia, unspecified: Secondary | ICD-10-CM | POA: Diagnosis not present

## 2016-09-21 DIAGNOSIS — M6281 Muscle weakness (generalized): Secondary | ICD-10-CM | POA: Diagnosis not present

## 2016-09-21 DIAGNOSIS — E1169 Type 2 diabetes mellitus with other specified complication: Secondary | ICD-10-CM | POA: Diagnosis not present

## 2016-09-21 DIAGNOSIS — M25561 Pain in right knee: Secondary | ICD-10-CM | POA: Diagnosis not present

## 2016-09-28 DIAGNOSIS — M21371 Foot drop, right foot: Secondary | ICD-10-CM | POA: Diagnosis not present

## 2016-09-28 DIAGNOSIS — M25561 Pain in right knee: Secondary | ICD-10-CM | POA: Diagnosis not present

## 2016-09-28 DIAGNOSIS — R2689 Other abnormalities of gait and mobility: Secondary | ICD-10-CM | POA: Diagnosis not present

## 2016-09-28 DIAGNOSIS — M6281 Muscle weakness (generalized): Secondary | ICD-10-CM | POA: Diagnosis not present

## 2016-10-12 DIAGNOSIS — M6281 Muscle weakness (generalized): Secondary | ICD-10-CM | POA: Diagnosis not present

## 2016-10-12 DIAGNOSIS — M25561 Pain in right knee: Secondary | ICD-10-CM | POA: Diagnosis not present

## 2016-10-12 DIAGNOSIS — M21371 Foot drop, right foot: Secondary | ICD-10-CM | POA: Diagnosis not present

## 2016-10-12 DIAGNOSIS — R2689 Other abnormalities of gait and mobility: Secondary | ICD-10-CM | POA: Diagnosis not present

## 2016-10-19 DIAGNOSIS — M6281 Muscle weakness (generalized): Secondary | ICD-10-CM | POA: Diagnosis not present

## 2016-10-19 DIAGNOSIS — R2689 Other abnormalities of gait and mobility: Secondary | ICD-10-CM | POA: Diagnosis not present

## 2016-10-19 DIAGNOSIS — M25561 Pain in right knee: Secondary | ICD-10-CM | POA: Diagnosis not present

## 2016-10-19 DIAGNOSIS — M21371 Foot drop, right foot: Secondary | ICD-10-CM | POA: Diagnosis not present

## 2016-10-20 DIAGNOSIS — S82141D Displaced bicondylar fracture of right tibia, subsequent encounter for closed fracture with routine healing: Secondary | ICD-10-CM | POA: Diagnosis not present

## 2016-10-21 DIAGNOSIS — E1169 Type 2 diabetes mellitus with other specified complication: Secondary | ICD-10-CM | POA: Diagnosis not present

## 2016-10-21 DIAGNOSIS — E785 Hyperlipidemia, unspecified: Secondary | ICD-10-CM | POA: Diagnosis not present

## 2016-10-22 ENCOUNTER — Other Ambulatory Visit: Payer: Self-pay | Admitting: Orthopedic Surgery

## 2016-10-22 DIAGNOSIS — M25561 Pain in right knee: Secondary | ICD-10-CM

## 2016-10-22 DIAGNOSIS — S82251K Displaced comminuted fracture of shaft of right tibia, subsequent encounter for closed fracture with nonunion: Secondary | ICD-10-CM

## 2016-10-26 ENCOUNTER — Other Ambulatory Visit: Payer: Self-pay

## 2016-10-26 ENCOUNTER — Ambulatory Visit
Admission: RE | Admit: 2016-10-26 | Discharge: 2016-10-26 | Disposition: A | Discharge status: Home / Self Care | Payer: BLUE CROSS/BLUE SHIELD | Source: Ambulatory Visit | Attending: Orthopedic Surgery | Admitting: Orthopedic Surgery

## 2016-10-26 DIAGNOSIS — M6281 Muscle weakness (generalized): Secondary | ICD-10-CM | POA: Diagnosis not present

## 2016-10-26 DIAGNOSIS — S82251K Displaced comminuted fracture of shaft of right tibia, subsequent encounter for closed fracture with nonunion: Secondary | ICD-10-CM

## 2016-10-26 DIAGNOSIS — R2689 Other abnormalities of gait and mobility: Secondary | ICD-10-CM | POA: Diagnosis not present

## 2016-10-26 DIAGNOSIS — M21371 Foot drop, right foot: Secondary | ICD-10-CM | POA: Diagnosis not present

## 2016-10-26 DIAGNOSIS — M25561 Pain in right knee: Secondary | ICD-10-CM | POA: Diagnosis not present

## 2016-11-02 DIAGNOSIS — M6281 Muscle weakness (generalized): Secondary | ICD-10-CM | POA: Diagnosis not present

## 2016-11-02 DIAGNOSIS — R2689 Other abnormalities of gait and mobility: Secondary | ICD-10-CM | POA: Diagnosis not present

## 2016-11-02 DIAGNOSIS — M25561 Pain in right knee: Secondary | ICD-10-CM | POA: Diagnosis not present

## 2016-11-02 DIAGNOSIS — M21371 Foot drop, right foot: Secondary | ICD-10-CM | POA: Diagnosis not present

## 2016-11-09 DIAGNOSIS — M25561 Pain in right knee: Secondary | ICD-10-CM | POA: Diagnosis not present

## 2016-11-09 DIAGNOSIS — M6281 Muscle weakness (generalized): Secondary | ICD-10-CM | POA: Diagnosis not present

## 2016-11-09 DIAGNOSIS — M21371 Foot drop, right foot: Secondary | ICD-10-CM | POA: Diagnosis not present

## 2016-11-09 DIAGNOSIS — R2689 Other abnormalities of gait and mobility: Secondary | ICD-10-CM | POA: Diagnosis not present

## 2016-11-16 DIAGNOSIS — M25561 Pain in right knee: Secondary | ICD-10-CM | POA: Diagnosis not present

## 2016-11-16 DIAGNOSIS — M21371 Foot drop, right foot: Secondary | ICD-10-CM | POA: Diagnosis not present

## 2016-11-16 DIAGNOSIS — M6281 Muscle weakness (generalized): Secondary | ICD-10-CM | POA: Diagnosis not present

## 2016-11-16 DIAGNOSIS — R2689 Other abnormalities of gait and mobility: Secondary | ICD-10-CM | POA: Diagnosis not present

## 2016-11-23 DIAGNOSIS — R2689 Other abnormalities of gait and mobility: Secondary | ICD-10-CM | POA: Diagnosis not present

## 2016-11-23 DIAGNOSIS — M6281 Muscle weakness (generalized): Secondary | ICD-10-CM | POA: Diagnosis not present

## 2016-11-23 DIAGNOSIS — M21371 Foot drop, right foot: Secondary | ICD-10-CM | POA: Diagnosis not present

## 2016-11-23 DIAGNOSIS — M25561 Pain in right knee: Secondary | ICD-10-CM | POA: Diagnosis not present

## 2016-11-30 DIAGNOSIS — M6281 Muscle weakness (generalized): Secondary | ICD-10-CM | POA: Diagnosis not present

## 2016-11-30 DIAGNOSIS — M25561 Pain in right knee: Secondary | ICD-10-CM | POA: Diagnosis not present

## 2016-11-30 DIAGNOSIS — R2689 Other abnormalities of gait and mobility: Secondary | ICD-10-CM | POA: Diagnosis not present

## 2016-11-30 DIAGNOSIS — M21371 Foot drop, right foot: Secondary | ICD-10-CM | POA: Diagnosis not present

## 2016-12-15 DIAGNOSIS — S83242A Other tear of medial meniscus, current injury, left knee, initial encounter: Secondary | ICD-10-CM | POA: Diagnosis not present

## 2016-12-21 DIAGNOSIS — E1169 Type 2 diabetes mellitus with other specified complication: Secondary | ICD-10-CM | POA: Diagnosis not present

## 2016-12-21 DIAGNOSIS — D473 Essential (hemorrhagic) thrombocythemia: Secondary | ICD-10-CM | POA: Diagnosis not present

## 2016-12-21 DIAGNOSIS — D649 Anemia, unspecified: Secondary | ICD-10-CM | POA: Diagnosis not present

## 2016-12-21 DIAGNOSIS — E785 Hyperlipidemia, unspecified: Secondary | ICD-10-CM | POA: Diagnosis not present

## 2017-02-02 DIAGNOSIS — S83281D Other tear of lateral meniscus, current injury, right knee, subsequent encounter: Secondary | ICD-10-CM | POA: Diagnosis not present

## 2017-02-02 DIAGNOSIS — S83242D Other tear of medial meniscus, current injury, left knee, subsequent encounter: Secondary | ICD-10-CM | POA: Diagnosis not present

## 2017-02-02 DIAGNOSIS — S82251D Displaced comminuted fracture of shaft of right tibia, subsequent encounter for closed fracture with routine healing: Secondary | ICD-10-CM | POA: Diagnosis not present

## 2017-02-02 DIAGNOSIS — S82141D Displaced bicondylar fracture of right tibia, subsequent encounter for closed fracture with routine healing: Secondary | ICD-10-CM | POA: Diagnosis not present

## 2017-02-22 DIAGNOSIS — E1169 Type 2 diabetes mellitus with other specified complication: Secondary | ICD-10-CM | POA: Diagnosis not present

## 2017-02-22 DIAGNOSIS — Z6832 Body mass index (BMI) 32.0-32.9, adult: Secondary | ICD-10-CM | POA: Diagnosis not present

## 2017-02-22 DIAGNOSIS — E669 Obesity, unspecified: Secondary | ICD-10-CM | POA: Diagnosis not present

## 2017-02-22 DIAGNOSIS — E785 Hyperlipidemia, unspecified: Secondary | ICD-10-CM | POA: Diagnosis not present

## 2017-02-24 DIAGNOSIS — E119 Type 2 diabetes mellitus without complications: Secondary | ICD-10-CM | POA: Diagnosis not present

## 2017-03-02 DIAGNOSIS — E113513 Type 2 diabetes mellitus with proliferative diabetic retinopathy with macular edema, bilateral: Secondary | ICD-10-CM | POA: Diagnosis not present

## 2017-03-04 DIAGNOSIS — E113512 Type 2 diabetes mellitus with proliferative diabetic retinopathy with macular edema, left eye: Secondary | ICD-10-CM | POA: Diagnosis not present

## 2017-03-11 DIAGNOSIS — E113511 Type 2 diabetes mellitus with proliferative diabetic retinopathy with macular edema, right eye: Secondary | ICD-10-CM | POA: Diagnosis not present

## 2017-03-14 DIAGNOSIS — H1131 Conjunctival hemorrhage, right eye: Secondary | ICD-10-CM | POA: Diagnosis not present

## 2017-03-24 DIAGNOSIS — H2511 Age-related nuclear cataract, right eye: Secondary | ICD-10-CM | POA: Diagnosis not present

## 2017-03-24 DIAGNOSIS — H25811 Combined forms of age-related cataract, right eye: Secondary | ICD-10-CM | POA: Diagnosis not present

## 2017-04-06 DIAGNOSIS — E113511 Type 2 diabetes mellitus with proliferative diabetic retinopathy with macular edema, right eye: Secondary | ICD-10-CM | POA: Diagnosis not present

## 2017-04-11 DIAGNOSIS — S82141D Displaced bicondylar fracture of right tibia, subsequent encounter for closed fracture with routine healing: Secondary | ICD-10-CM | POA: Diagnosis not present

## 2017-04-11 DIAGNOSIS — E113513 Type 2 diabetes mellitus with proliferative diabetic retinopathy with macular edema, bilateral: Secondary | ICD-10-CM | POA: Diagnosis not present

## 2017-04-11 DIAGNOSIS — S83281D Other tear of lateral meniscus, current injury, right knee, subsequent encounter: Secondary | ICD-10-CM | POA: Diagnosis not present

## 2017-04-11 DIAGNOSIS — S82251D Displaced comminuted fracture of shaft of right tibia, subsequent encounter for closed fracture with routine healing: Secondary | ICD-10-CM | POA: Diagnosis not present

## 2017-04-13 DIAGNOSIS — E785 Hyperlipidemia, unspecified: Secondary | ICD-10-CM | POA: Diagnosis not present

## 2017-04-13 DIAGNOSIS — D649 Anemia, unspecified: Secondary | ICD-10-CM | POA: Diagnosis not present

## 2017-04-13 DIAGNOSIS — E1169 Type 2 diabetes mellitus with other specified complication: Secondary | ICD-10-CM | POA: Diagnosis not present

## 2017-04-13 DIAGNOSIS — D473 Essential (hemorrhagic) thrombocythemia: Secondary | ICD-10-CM | POA: Diagnosis not present

## 2017-05-11 DIAGNOSIS — H25812 Combined forms of age-related cataract, left eye: Secondary | ICD-10-CM | POA: Diagnosis not present

## 2017-05-11 DIAGNOSIS — H2512 Age-related nuclear cataract, left eye: Secondary | ICD-10-CM | POA: Diagnosis not present

## 2017-05-18 DIAGNOSIS — E113513 Type 2 diabetes mellitus with proliferative diabetic retinopathy with macular edema, bilateral: Secondary | ICD-10-CM | POA: Diagnosis not present

## 2017-06-08 DIAGNOSIS — E113513 Type 2 diabetes mellitus with proliferative diabetic retinopathy with macular edema, bilateral: Secondary | ICD-10-CM | POA: Diagnosis not present

## 2017-06-30 DIAGNOSIS — E113513 Type 2 diabetes mellitus with proliferative diabetic retinopathy with macular edema, bilateral: Secondary | ICD-10-CM | POA: Diagnosis not present

## 2017-07-05 DIAGNOSIS — H4311 Vitreous hemorrhage, right eye: Secondary | ICD-10-CM | POA: Diagnosis not present

## 2017-07-13 DIAGNOSIS — T8484XA Pain due to internal orthopedic prosthetic devices, implants and grafts, initial encounter: Secondary | ICD-10-CM | POA: Diagnosis not present

## 2017-07-25 DIAGNOSIS — E1169 Type 2 diabetes mellitus with other specified complication: Secondary | ICD-10-CM | POA: Diagnosis not present

## 2017-07-25 DIAGNOSIS — Z1331 Encounter for screening for depression: Secondary | ICD-10-CM | POA: Diagnosis not present

## 2017-07-25 DIAGNOSIS — E785 Hyperlipidemia, unspecified: Secondary | ICD-10-CM | POA: Diagnosis not present

## 2017-07-25 DIAGNOSIS — Z0000 Encounter for general adult medical examination without abnormal findings: Secondary | ICD-10-CM | POA: Diagnosis not present

## 2017-07-25 DIAGNOSIS — Z125 Encounter for screening for malignant neoplasm of prostate: Secondary | ICD-10-CM | POA: Diagnosis not present

## 2017-07-27 ENCOUNTER — Ambulatory Visit (INDEPENDENT_AMBULATORY_CARE_PROVIDER_SITE_OTHER): Payer: BLUE CROSS/BLUE SHIELD

## 2017-07-27 ENCOUNTER — Ambulatory Visit (INDEPENDENT_AMBULATORY_CARE_PROVIDER_SITE_OTHER): Payer: BLUE CROSS/BLUE SHIELD | Admitting: Orthopaedic Surgery

## 2017-07-27 ENCOUNTER — Encounter (INDEPENDENT_AMBULATORY_CARE_PROVIDER_SITE_OTHER): Payer: Self-pay | Admitting: Orthopaedic Surgery

## 2017-07-27 DIAGNOSIS — S83203S Other tear of unspecified meniscus, current injury, right knee, sequela: Secondary | ICD-10-CM | POA: Diagnosis not present

## 2017-07-27 DIAGNOSIS — M25561 Pain in right knee: Secondary | ICD-10-CM | POA: Diagnosis not present

## 2017-07-27 DIAGNOSIS — G8929 Other chronic pain: Secondary | ICD-10-CM | POA: Diagnosis not present

## 2017-07-27 NOTE — Progress Notes (Signed)
Office Visit Note   Patient: Lawrence [name]           Date of Birth: Jun 11, 1965           MRN: [id number] Visit Date: 07/27/2017              Requested by: Melony Overly, MD [address] [place], [place] [address] PCP: Melony Overly, MD   Assessment & Plan: Visit Diagnoses:  1. Chronic pain of right knee   2. Complex tear of meniscus of right knee, unspecified meniscus, unspecified whether old or current tear, sequela     Plan: I do feel that he would benefit from a right knee arthroscopy at this point.  He has had a steroid injection in his knee before it did help with his pain but did increase his blood glucose.  Given the locking catching in his knee and the pain that he is having an arthroscopic intervention is warranted to perform a chondroplasty and remove any loose bodies as well as address the meniscal tearing is often seen with such traumatic tibial plateau fractures.  I went over knee model explained in detail with the surgery involves.  He talked about trying this first as opposed to also removing the hardware and I agree with this.  I do feel that it some point he has have a knee replacement he would have to have better glucose control as well and right now her neck and addressed the hardware because it is not problematic or prominent.  I showed him a knee model we talked in detail what this type of surgery involves including a thorough discussion the risk and benefits of the surgery.  He does wish to proceed with this.  We would then see him back in 1 week postoperative for suture removal.  All questions concerns were answered and addressed.  Follow-Up Instructions: Return for 1 week post-op.   Orders:  Orders Placed This Encounter  Procedures  . XR Knee 1-2 Views Right   No orders of the defined types were placed in this encounter.     Procedures: No procedures performed   Clinical Data: No additional findings.   Subjective: Chief Complaint    Patient presents with  . Right Knee - Pain  The patient is a very pleasant 52 year old gentleman who is 17 months out from open reduction internal fixation of a complex tibial plateau fracture of the right tibia.  He is somewhat edematous with a cane.  He is also someone who used to be a significantly poorly controlled diabetic and is still having problems of blood glucose.  He is a patient of Dr. Altamese [place] orthopedic trauma specialist in town here.  He is sent to me for considering arthroscopic intervention for his right knee.  The patient does report anterior knee pain and is only along the medial lateral joint lines.  He is been having some locking catching in his knee and just pain gentle with weightbearing activities.  The fracture is since healed with confirmation on CT scan and clinical exam.  I was able to review all the notes from the time of his original injury to the different surgeries he went through as well.  HPI  Review of Systems He currently denies any headache, chest pain, shortness of breath, fever, chills, nausea, vomiting.  Objective: Vital Signs: There were no vitals taken for this visit.  Physical Exam He is alert and oriented x3 and in no acute distress  Ortho Exam Examination of his right knee does show a mild effusion.  He has slight varus malalignment but this is the same as his nonoperative left knee.  He has well-healed surgical incisions about his knee.  His calf is soft.  There is no evidence of infection.  He actually has really good range of motion of that knee.  There is slight laxity of the ligaments but is only slight. Specialty Comments:  No specialty comments available.  Imaging: Xr Knee 1-2 Views Right  Result Date: 07/27/2017 2 views of the right knee show well-maintained joint space with osteophytes in the joint itself and possible loose bodies.  There is a well-healed tibial plateau fracture with an intact long lateral plate.  The fracture itself  is healed completely.    PMFS History: Patient Active Problem List   Diagnosis Date Noted  . Fall 03/08/2016  . Fever 03/05/2016  . Diabetes ([facility]) 02/28/2016  . Tibial plateau fracture 02/27/2016   Past Medical History:  Diagnosis Date  . Diabetes mellitus without complication ([place])     History reviewed. No pertinent family history.  Past Surgical History:  Procedure Laterality Date  . EXTERNAL FIXATION LEG Right 02/27/2016   Procedure: EXTERNAL FIXATION LEG;  Surgeon: Netta Cedars, MD;  Location: [facility];  Service: Orthopedics;  Laterality: Right;  reduction and external fixation right tibial plateau fracture  . EXTERNAL FIXATION REMOVAL Right 03/04/2016   Procedure: REMOVAL EXTERNAL FIXATION LEG;  Surgeon: Altamese [place], MD;  Location: [facility];  Service: Orthopedics;  Laterality: Right;  . HARVEST BONE GRAFT Left 03/04/2016   Procedure: Rimmed IM aspirate;  Surgeon: Altamese [place], MD;  Location: [facility];  Service: Orthopedics;  Laterality: Left;  . ORIF TIBIA PLATEAU Right 03/04/2016   Procedure: OPEN REDUCTION INTERNAL FIXATION (ORIF) TIBIAL PLATEAU;  Surgeon: Altamese [place], MD;  Location: [facility];  Service: Orthopedics;  Laterality: Right;   Social History   Occupational History  . Not on file  Tobacco Use  . Smoking status: Never Smoker  . Smokeless tobacco: Never Used  Substance and Sexual Activity  . Alcohol use: Yes  . Drug use: No  . Sexual activity: Yes

## 2017-08-02 DIAGNOSIS — E113513 Type 2 diabetes mellitus with proliferative diabetic retinopathy with macular edema, bilateral: Secondary | ICD-10-CM | POA: Diagnosis not present

## 2017-08-11 ENCOUNTER — Encounter: Payer: Self-pay | Admitting: Orthopaedic Surgery

## 2017-08-11 ENCOUNTER — Telehealth (INDEPENDENT_AMBULATORY_CARE_PROVIDER_SITE_OTHER): Payer: Self-pay

## 2017-08-11 DIAGNOSIS — G8918 Other acute postprocedural pain: Secondary | ICD-10-CM | POA: Diagnosis not present

## 2017-08-11 DIAGNOSIS — M23261 Derangement of other lateral meniscus due to old tear or injury, right knee: Secondary | ICD-10-CM | POA: Diagnosis not present

## 2017-08-11 DIAGNOSIS — S83281A Other tear of lateral meniscus, current injury, right knee, initial encounter: Secondary | ICD-10-CM | POA: Diagnosis not present

## 2017-08-11 NOTE — Telephone Encounter (Signed)
Wants to know if rx for hydrocodone can be changed from 1 to 2 tabs every 4 to 6 hrs to 1 to 2 every 6 hrs

## 2017-08-11 NOTE — Telephone Encounter (Signed)
Gave verbal ok for this

## 2017-08-17 ENCOUNTER — Encounter (INDEPENDENT_AMBULATORY_CARE_PROVIDER_SITE_OTHER): Payer: Self-pay | Admitting: Orthopaedic Surgery

## 2017-08-17 ENCOUNTER — Ambulatory Visit (INDEPENDENT_AMBULATORY_CARE_PROVIDER_SITE_OTHER): Payer: BLUE CROSS/BLUE SHIELD | Admitting: Orthopaedic Surgery

## 2017-08-17 DIAGNOSIS — Z9889 Other specified postprocedural states: Secondary | ICD-10-CM

## 2017-08-17 NOTE — Progress Notes (Signed)
The patient is following up status post a right knee arthroscopy.  The sutures have been removed in his knee looks good overall.  There is a moderate effusion.  He does not need to have this drained off he states because not uncomfortable.  We went over his arthroscopy pictures shown in the extent of the lateral compartment of his knee where he had a lateral meniscal tear from the remote trauma of the severe lateral tibial plateau fracture.  There is only some mild to moderate cartilage changes with no full-thickness areas to warrant any knee replacement.  His ACL and PCL were intact.  The medial compartment of his knee was pristine as well as the patellofemoral joint.  He will continue increase his activities as he tolerates.  I would like to see him back in 4 weeks and if he still has an effusion we will remove that from his knee and certainly place a steroid injection at that time.  He is definitely a candidate for hyaluronic acid in the future as well and he understands this.  All questions concerns were answered and addressed.  We will reevaluate him in 4 weeks and likely place a steroid injection in his knee at that visit.

## 2017-08-22 DIAGNOSIS — D473 Essential (hemorrhagic) thrombocythemia: Secondary | ICD-10-CM | POA: Diagnosis not present

## 2017-08-22 DIAGNOSIS — E785 Hyperlipidemia, unspecified: Secondary | ICD-10-CM | POA: Diagnosis not present

## 2017-08-22 DIAGNOSIS — D649 Anemia, unspecified: Secondary | ICD-10-CM | POA: Diagnosis not present

## 2017-08-22 DIAGNOSIS — E1169 Type 2 diabetes mellitus with other specified complication: Secondary | ICD-10-CM | POA: Diagnosis not present

## 2017-09-06 DIAGNOSIS — E113513 Type 2 diabetes mellitus with proliferative diabetic retinopathy with macular edema, bilateral: Secondary | ICD-10-CM | POA: Diagnosis not present

## 2017-09-19 ENCOUNTER — Ambulatory Visit (INDEPENDENT_AMBULATORY_CARE_PROVIDER_SITE_OTHER): Payer: BLUE CROSS/BLUE SHIELD | Admitting: Orthopaedic Surgery

## 2017-09-19 ENCOUNTER — Encounter (INDEPENDENT_AMBULATORY_CARE_PROVIDER_SITE_OTHER): Payer: Self-pay | Admitting: Orthopaedic Surgery

## 2017-09-19 DIAGNOSIS — Z9889 Other specified postprocedural states: Secondary | ICD-10-CM | POA: Diagnosis not present

## 2017-09-19 DIAGNOSIS — M1711 Unilateral primary osteoarthritis, right knee: Secondary | ICD-10-CM

## 2017-09-19 DIAGNOSIS — G8929 Other chronic pain: Secondary | ICD-10-CM | POA: Diagnosis not present

## 2017-09-19 DIAGNOSIS — M25561 Pain in right knee: Secondary | ICD-10-CM

## 2017-09-19 MED ORDER — METHYLPREDNISOLONE ACETATE 40 MG/ML IJ SUSP
40.0000 mg | INTRAMUSCULAR | Status: AC | PRN
  Administered 2017-09-19: 40 mg via INTRA_ARTICULAR

## 2017-09-19 MED ORDER — LIDOCAINE HCL 1 % IJ SOLN
3.0000 mL | INTRAMUSCULAR | Status: AC | PRN
  Administered 2017-09-19: 3 mL

## 2017-09-19 NOTE — Progress Notes (Signed)
Office Visit Note   Patient: Lawrence [name]           Date of Birth: [birth year]           MRN: [id number] Visit Date: 09/19/2017              Requested by: Melony Overly, MD [address] [place],  [address] PCP: Melony Overly, MD   Assessment & Plan: Visit Diagnoses:  1. Unilateral primary osteoarthritis, right knee   2. Chronic pain of right knee   3. Status post arthroscopy of right knee     Plan: At this point the patient is a perfect candidate for steroid injection in his right knee today to treat his moderate arthritis pain followed by a follow-up in 4 weeks with a Synvisc 1 injection in the right knee.  I gave him a handout about Synvisc 1 and explained the risk and benefits of both types of injections.  He is agreeable to this treatment plan and course.  Follow-Up Instructions: Return in about 1 month (around 10/17/2017).   Orders:  No orders of the defined types were placed in this encounter.  No orders of the defined types were placed in this encounter.     Procedures: Large Joint Inj: R knee on 09/19/2017 9:57 AM Indications: diagnostic evaluation and pain Details: 22 G 1.5 in needle, superolateral approach  Arthrogram: No  Medications: 3 mL lidocaine 1 %; 40 mg methylPREDNISolone acetate 40 MG/ML Outcome: tolerated well, no immediate complications Procedure, treatment alternatives, risks and benefits explained, specific risks discussed. Consent was given by the patient. Immediately prior to procedure a time out was called to verify the correct patient, procedure, equipment, support staff and site/side marked as required. Patient was prepped and draped in the usual sterile fashion.       Clinical Data: No additional findings.   Subjective: Chief Complaint  Patient presents with  . Right Knee - Follow-up  The patient is now about 6 weeks status post a right knee arthroscopy.  He had a chronic lateral meniscal tear and some posttraumatic  arthritic changes in his knee.  He is ambulating with a cane and does say that he is doing better since the arthroscopy with improved motion and decreased pain.  HPI  Review of Systems He currently denies any fever, chills, nausea, vomiting.  Objective: Vital Signs: There were no vitals taken for this visit.  Physical Exam He is alert and oriented x3 and in no acute distress Ortho Exam Examination of his right knee does show varus malalignment.  There is mild effusion with good range of motion. Specialty Comments:  No specialty comments available.  Imaging: No results found.   PMFS History: Patient Active Problem List   Diagnosis Date Noted  . Unilateral primary osteoarthritis, right knee 09/19/2017  . Chronic pain of right knee 09/19/2017  . Status post arthroscopy of right knee 09/19/2017  . Fall 03/08/2016  . Fever 03/05/2016  . Diabetes ([facility]) 02/28/2016  . Tibial plateau fracture 02/27/2016   Past Medical History:  Diagnosis Date  . Diabetes mellitus without complication ([facility])     History reviewed. No pertinent family history.  Past Surgical History:  Procedure Laterality Date  . EXTERNAL FIXATION LEG Right 02/27/2016   Procedure: EXTERNAL FIXATION LEG;  Surgeon: Netta Cedars, MD;  Location: [facility];  Service: Orthopedics;  Laterality: Right;  reduction and external fixation right tibial plateau fracture  . EXTERNAL FIXATION REMOVAL Right 03/04/2016  Procedure: REMOVAL EXTERNAL FIXATION LEG;  Surgeon: Altamese [place], MD;  Location: [facility];  Service: Orthopedics;  Laterality: Right;  . HARVEST BONE GRAFT Left 03/04/2016   Procedure: Rimmed IM aspirate;  Surgeon: Altamese [place], MD;  Location: [facility];  Service: Orthopedics;  Laterality: Left;  . ORIF TIBIA PLATEAU Right 03/04/2016   Procedure: OPEN REDUCTION INTERNAL FIXATION (ORIF) TIBIAL PLATEAU;  Surgeon: Altamese [place], MD;  Location: [facility];  Service: Orthopedics;  Laterality: Right;   Social History   Occupational  History  . Not on file  Tobacco Use  . Smoking status: Never Smoker  . Smokeless tobacco: Never Used  Substance and Sexual Activity  . Alcohol use: Yes  . Drug use: No  . Sexual activity: Yes

## 2017-10-06 DIAGNOSIS — R809 Proteinuria, unspecified: Secondary | ICD-10-CM | POA: Diagnosis not present

## 2017-10-06 DIAGNOSIS — E785 Hyperlipidemia, unspecified: Secondary | ICD-10-CM | POA: Diagnosis not present

## 2017-10-06 DIAGNOSIS — E1169 Type 2 diabetes mellitus with other specified complication: Secondary | ICD-10-CM | POA: Diagnosis not present

## 2017-10-06 DIAGNOSIS — E039 Hypothyroidism, unspecified: Secondary | ICD-10-CM | POA: Diagnosis not present

## 2017-10-06 DIAGNOSIS — E119 Type 2 diabetes mellitus without complications: Secondary | ICD-10-CM | POA: Diagnosis not present

## 2017-10-06 DIAGNOSIS — E669 Obesity, unspecified: Secondary | ICD-10-CM | POA: Diagnosis not present

## 2017-10-06 DIAGNOSIS — D649 Anemia, unspecified: Secondary | ICD-10-CM | POA: Diagnosis not present

## 2017-10-19 ENCOUNTER — Ambulatory Visit (INDEPENDENT_AMBULATORY_CARE_PROVIDER_SITE_OTHER): Payer: BLUE CROSS/BLUE SHIELD | Admitting: Orthopaedic Surgery

## 2017-10-19 ENCOUNTER — Encounter (INDEPENDENT_AMBULATORY_CARE_PROVIDER_SITE_OTHER): Payer: Self-pay | Admitting: Orthopaedic Surgery

## 2017-10-19 DIAGNOSIS — M1711 Unilateral primary osteoarthritis, right knee: Secondary | ICD-10-CM

## 2017-10-19 MED ORDER — HYALURONAN 88 MG/4ML IX SOSY
88.0000 mg | PREFILLED_SYRINGE | INTRA_ARTICULAR | Status: AC | PRN
  Administered 2017-10-19: 88 mg via INTRA_ARTICULAR

## 2017-10-19 MED ORDER — LIDOCAINE HCL 1 % IJ SOLN
0.5000 mL | INTRAMUSCULAR | Status: AC | PRN
  Administered 2017-10-19: .5 mL

## 2017-10-19 NOTE — Progress Notes (Signed)
   Procedure Note  Patient: Lawrence [name]             Date of Birth: 16-Aug-1965           MRN: [id number]             Visit Date: 10/19/2017  HPI: Lawrence [name] returns today for a Synvisc One injection right knee. He states the injection with cortisone helped the knee pian However still has pain and giving way sensation . He feels the hardware is causing the pain in his knee.   PE: Right knee no effusion or abnormal warmth. Good range of motion right knee.   Procedures: Visit Diagnoses: Unilateral primary osteoarthritis, right knee  Large Joint Inj: R knee on 10/19/2017 10:51 AM Indications: pain Details: 22 G 1.5 in needle, anterolateral approach  Arthrogram: No  Medications: 88 mg Hyaluronan 88 MG/4ML; 0.5 mL lidocaine 1 % Outcome: tolerated well, no immediate complications Procedure, treatment alternatives, risks and benefits explained, specific risks discussed. Consent was given by the patient. Immediately prior to procedure a time out was called to verify the correct patient, procedure, equipment, support staff and site/side marked as required. Patient was prepped and draped in the usual sterile fashion.     Plan: follow up in 8 weeks to check his response to the injection.

## 2017-11-15 DIAGNOSIS — E113513 Type 2 diabetes mellitus with proliferative diabetic retinopathy with macular edema, bilateral: Secondary | ICD-10-CM | POA: Diagnosis not present

## 2017-12-19 ENCOUNTER — Encounter (INDEPENDENT_AMBULATORY_CARE_PROVIDER_SITE_OTHER): Payer: Self-pay | Admitting: Orthopaedic Surgery

## 2017-12-19 ENCOUNTER — Ambulatory Visit (INDEPENDENT_AMBULATORY_CARE_PROVIDER_SITE_OTHER): Payer: BLUE CROSS/BLUE SHIELD | Admitting: Orthopaedic Surgery

## 2017-12-19 DIAGNOSIS — G8929 Other chronic pain: Secondary | ICD-10-CM

## 2017-12-19 DIAGNOSIS — M25561 Pain in right knee: Secondary | ICD-10-CM

## 2017-12-19 DIAGNOSIS — M1711 Unilateral primary osteoarthritis, right knee: Secondary | ICD-10-CM

## 2017-12-19 MED ORDER — METHYLPREDNISOLONE ACETATE 40 MG/ML IJ SUSP
40.0000 mg | INTRAMUSCULAR | Status: AC | PRN
  Administered 2017-12-19: 40 mg via INTRA_ARTICULAR

## 2017-12-19 MED ORDER — LIDOCAINE HCL 1 % IJ SOLN
3.0000 mL | INTRAMUSCULAR | Status: AC | PRN
  Administered 2017-12-19: 3 mL

## 2017-12-19 NOTE — Progress Notes (Signed)
Office Visit Note   Patient: Lawrence [name]           Date of Birth: Jan 24, 1966           MRN: [id number] Visit Date: 12/19/2017              Requested by: Melony Overly, MD [address] [place], [place] [address] PCP: Melony Overly, MD   Assessment & Plan: Visit Diagnoses:  1. Unilateral primary osteoarthritis, right knee   2. Chronic pain of right knee     Plan:  I spoke with him about trying a steroid injection today in his right knee and agreed with this.  He will continue with his cane.  We will see him back in 6 months with an AP and lateral of the right knee.  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   Follow-Up Instructions: Return in about 6 months (around 06/21/2018).   Orders:  Orders Placed This Encounter  Procedures  . Large Joint Inj   No orders of the defined types were placed in this encounter.     Procedures: Large Joint Inj: R knee on 12/19/2017 10:36 AM Indications: diagnostic evaluation and pain Details: 22 G 1.5 in needle, superolateral approach  Arthrogram: No  Medications: 3 mL lidocaine 1 %; 40 mg methylPREDNISolone acetate 40 MG/ML Outcome: tolerated well, no immediate complications Procedure, treatment alternatives, risks and benefits explained, specific risks discussed. Consent was given by the patient. Immediately prior to procedure a time out was called to verify the correct patient, procedure, equipment, support staff and site/side marked as required. Patient was prepped and draped in the usual sterile fashion.       Clinical Data: No additional findings.   Subjective: Chief Complaint  Patient presents with  . Right Knee - Pain, Follow-up    S/p Synvisc One injection on 10/19/17   The patient is returning for continued follow-up of his chronic right knee pain.  He had open  reduction and internal fixation of a tibial plateau fracture in September 2017.  This was done elsewhere.  He has had chronic knee pain since then.  We actually performed arthroscopic intervention of that knee earlier this year and did not find any severe arthritic changes in the knee but there is meniscal tearing.  In the interim we have placed a Synvisc 1 injection in his knee about 2 months ago.  He still uses a cane to ambulate in his room not a lot of relief after that injection.  He has been dealing though with some kidney disease.  He says it is more of a potassium issue.  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 HPI  Review of Systems He currently denies any headache, chest pain, shortness of breath, fever, chills, nausea, vomiting.  Objective: Vital Signs: There were no vitals taken for this visit.  Physical Exam He is alert and oriented x3 today and in no acute distress Ortho Exam Examination of his right knee shows just a slight effusion.  He does have slight varus malalignment of the leg and knee in general.  His range of motion is full and the knee  feels ligamentously stable. Specialty Comments:  No specialty comments available.  Imaging: No results found.   PMFS History: Patient Active Problem List   Diagnosis Date Noted  . Unilateral primary osteoarthritis, right knee 09/19/2017  . Chronic pain of right knee 09/19/2017  . Status post arthroscopy of right knee 09/19/2017  . Fall 03/08/2016  . Fever 03/05/2016  . Diabetes ([facility]) 02/28/2016  . Tibial plateau fracture 02/27/2016   Past Medical History:  Diagnosis Date  . Diabetes mellitus without complication ([facility])     History reviewed. No pertinent family history.  Past Surgical History:  Procedure Laterality Date  . EXTERNAL FIXATION LEG Right 02/27/2016   Procedure: EXTERNAL FIXATION LEG;   Surgeon: Netta Cedars, MD;  Location: [facility];  Service: Orthopedics;  Laterality: Right;  reduction and external fixation right tibial plateau fracture  . EXTERNAL FIXATION REMOVAL Right 03/04/2016   Procedure: REMOVAL EXTERNAL FIXATION LEG;  Surgeon: Altamese [place], MD;  Location: [facility];  Service: Orthopedics;  Laterality: Right;  . HARVEST BONE GRAFT Left 03/04/2016   Procedure: Rimmed IM aspirate;  Surgeon: Altamese [place], MD;  Location: [facility];  Service: Orthopedics;  Laterality: Left;  . ORIF TIBIA PLATEAU Right 03/04/2016   Procedure: OPEN REDUCTION INTERNAL FIXATION (ORIF) TIBIAL PLATEAU;  Surgeon: Altamese [place], MD;  Location: [facility];  Service: Orthopedics;  Laterality: Right;   Social History   Occupational History  . Not on file  Tobacco Use  . Smoking status: Never Smoker  . Smokeless tobacco: Never Used  Substance and Sexual Activity  . Alcohol use: Yes  . Drug use: No  . Sexual activity: Yes

## 2018-01-03 DIAGNOSIS — Z1211 Encounter for screening for malignant neoplasm of colon: Secondary | ICD-10-CM | POA: Diagnosis not present

## 2018-01-03 DIAGNOSIS — I10 Essential (primary) hypertension: Secondary | ICD-10-CM | POA: Diagnosis not present

## 2018-01-03 DIAGNOSIS — E669 Obesity, unspecified: Secondary | ICD-10-CM | POA: Diagnosis not present

## 2018-01-03 DIAGNOSIS — E1169 Type 2 diabetes mellitus with other specified complication: Secondary | ICD-10-CM | POA: Diagnosis not present

## 2018-01-05 DIAGNOSIS — E1169 Type 2 diabetes mellitus with other specified complication: Secondary | ICD-10-CM | POA: Diagnosis not present

## 2018-01-05 DIAGNOSIS — E785 Hyperlipidemia, unspecified: Secondary | ICD-10-CM | POA: Diagnosis not present

## 2018-01-05 DIAGNOSIS — R809 Proteinuria, unspecified: Secondary | ICD-10-CM | POA: Diagnosis not present

## 2018-01-05 DIAGNOSIS — R309 Painful micturition, unspecified: Secondary | ICD-10-CM | POA: Diagnosis not present

## 2018-01-05 DIAGNOSIS — E669 Obesity, unspecified: Secondary | ICD-10-CM | POA: Diagnosis not present

## 2018-01-31 DIAGNOSIS — E113513 Type 2 diabetes mellitus with proliferative diabetic retinopathy with macular edema, bilateral: Secondary | ICD-10-CM | POA: Diagnosis not present

## 2018-03-16 DIAGNOSIS — E119 Type 2 diabetes mellitus without complications: Secondary | ICD-10-CM | POA: Diagnosis not present

## 2018-03-29 IMAGING — CT CT KNEE*R* W/O CM
3 of 7 series · 6 of 33 positions shown, 7 images · non-contrast
Comparison: Right knee radiographs performed earlier today at [DATE]
p.m.

CLINICAL DATA: Status post external fixation of right tibial
plateau fracture. Initial encounter.

EXAM:
CT OF THE RIGHT KNEE WITHOUT CONTRAST
TECHNIQUE: Multidetector CT imaging of the right knee was performed according
to the standard protocol. Multiplanar CT image reconstructions were
also generated.

[Series 205: axial bone · axial · 0.38mm/px · z∈[-420,-349]mm · 2 of 161 slices shown]
[im 41/161  bone]
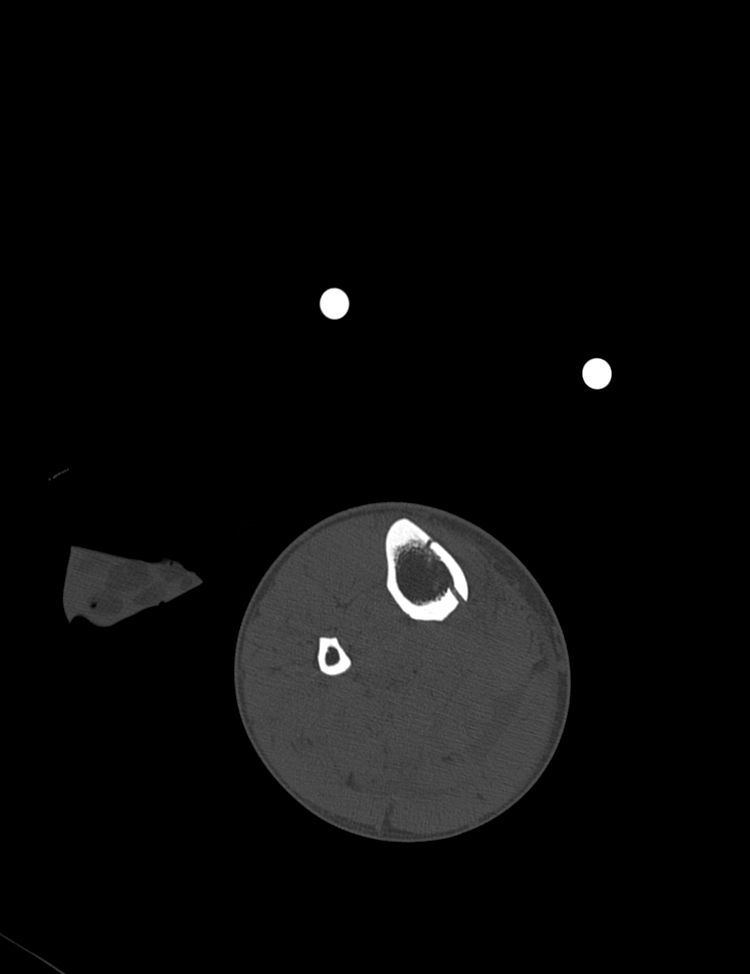
[im 81/161  bone]
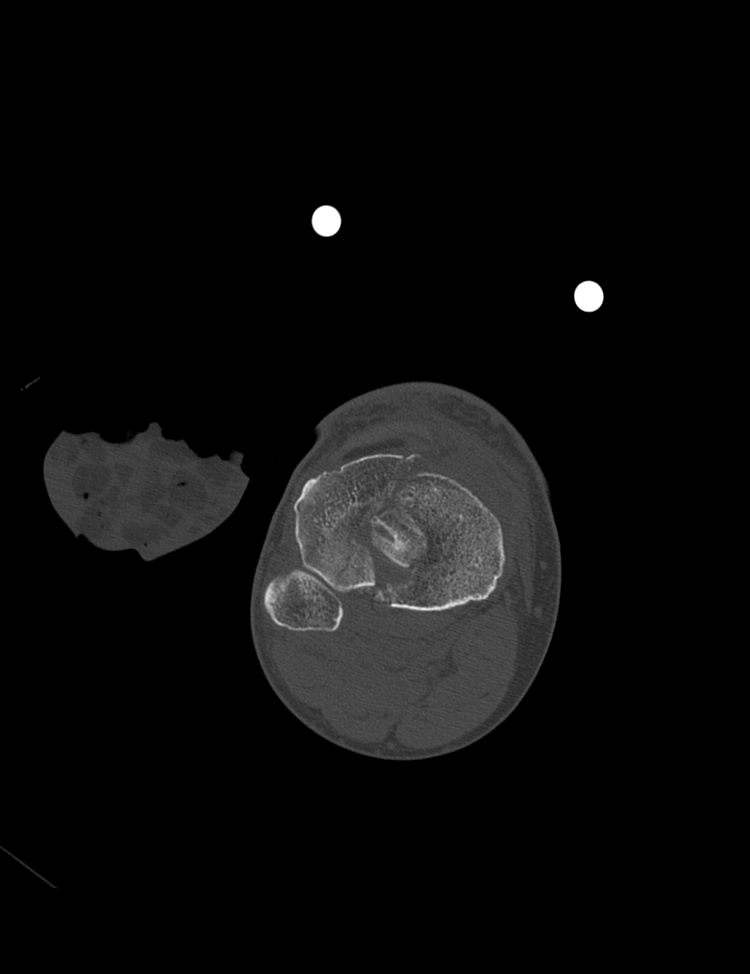

[Series 206: coronal bone · coronal · 0.39mm/px · 1 of 77 slices shown]
[im 39/77  bone]
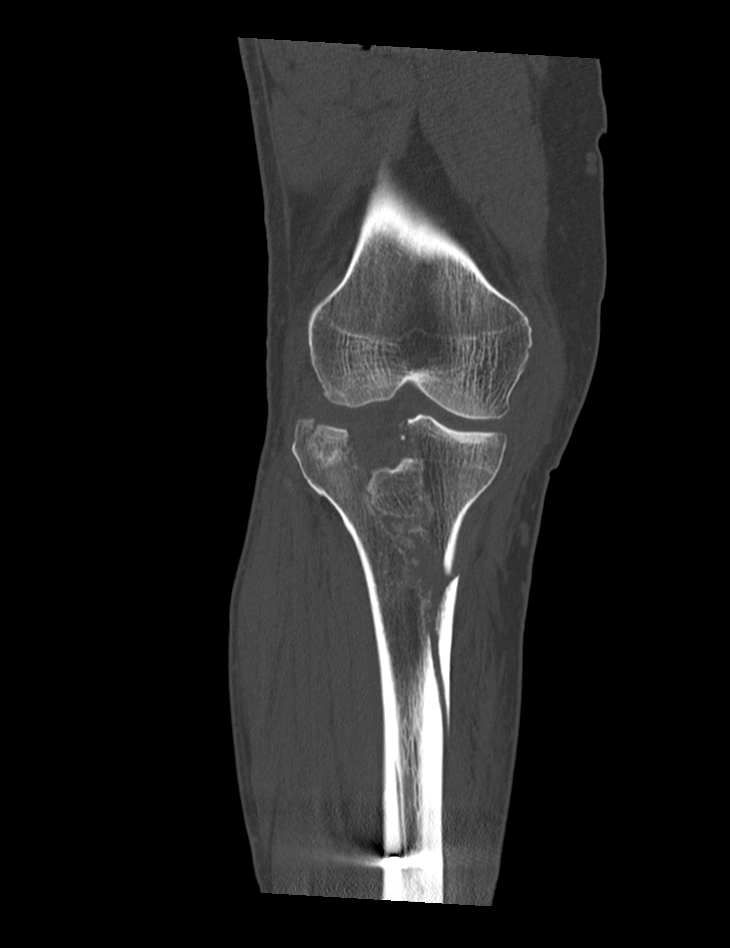

[Series 2011: axial st · axial · 0.38mm/px · z∈[-492,-332]mm · 3 of 163 slices shown, 4 images]
[im 41/163  soft-tissue]
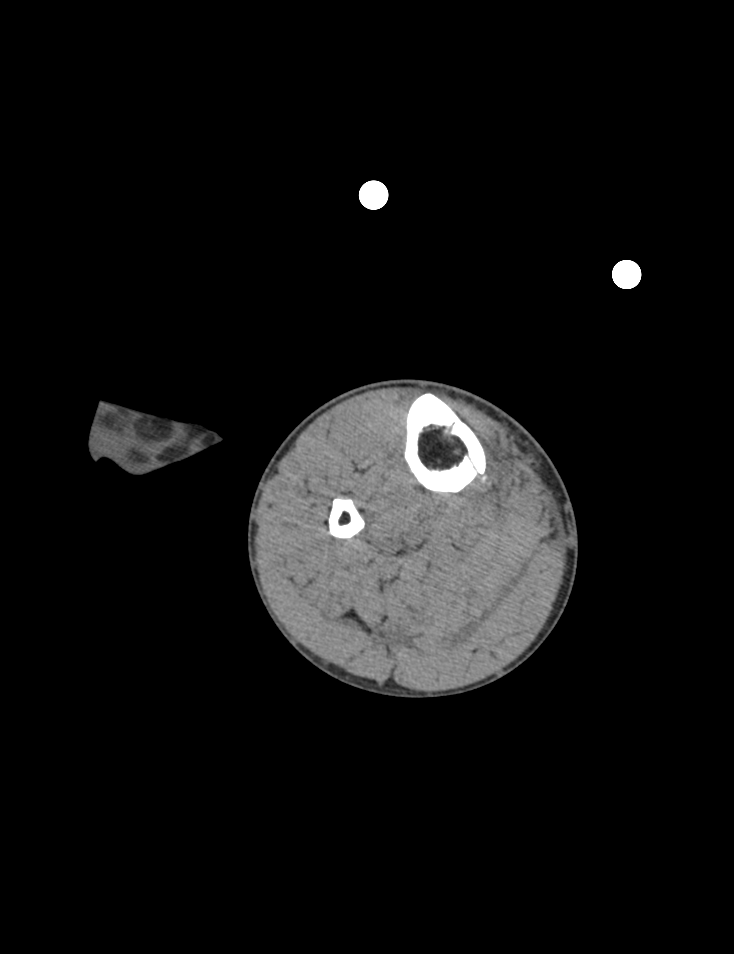
[im 41/163  bone]
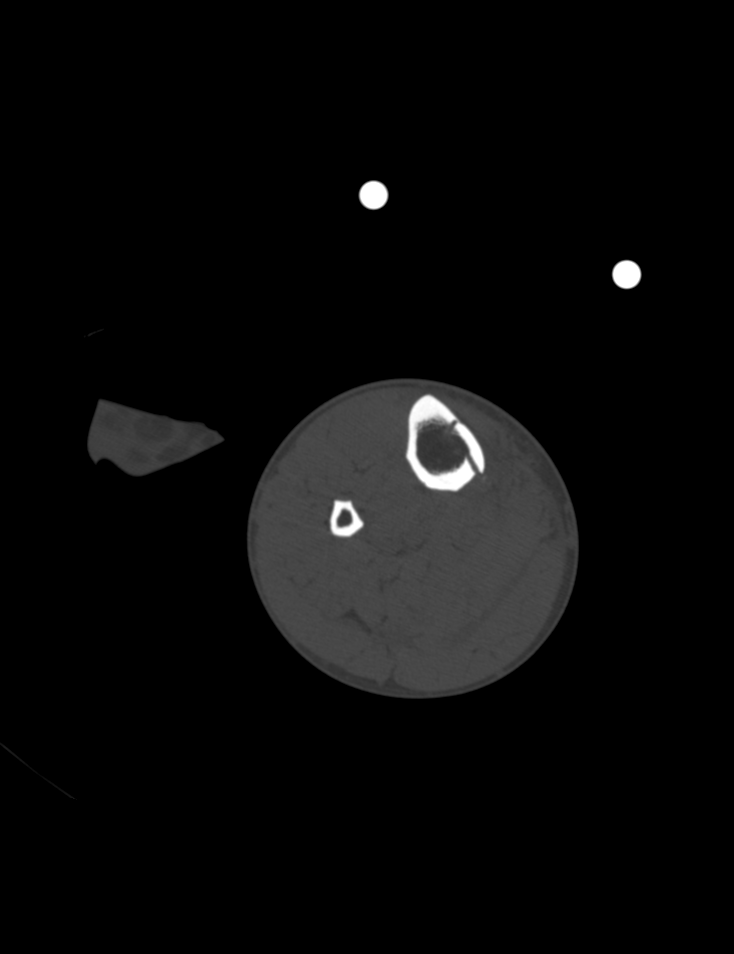
[im 82/163  bone]
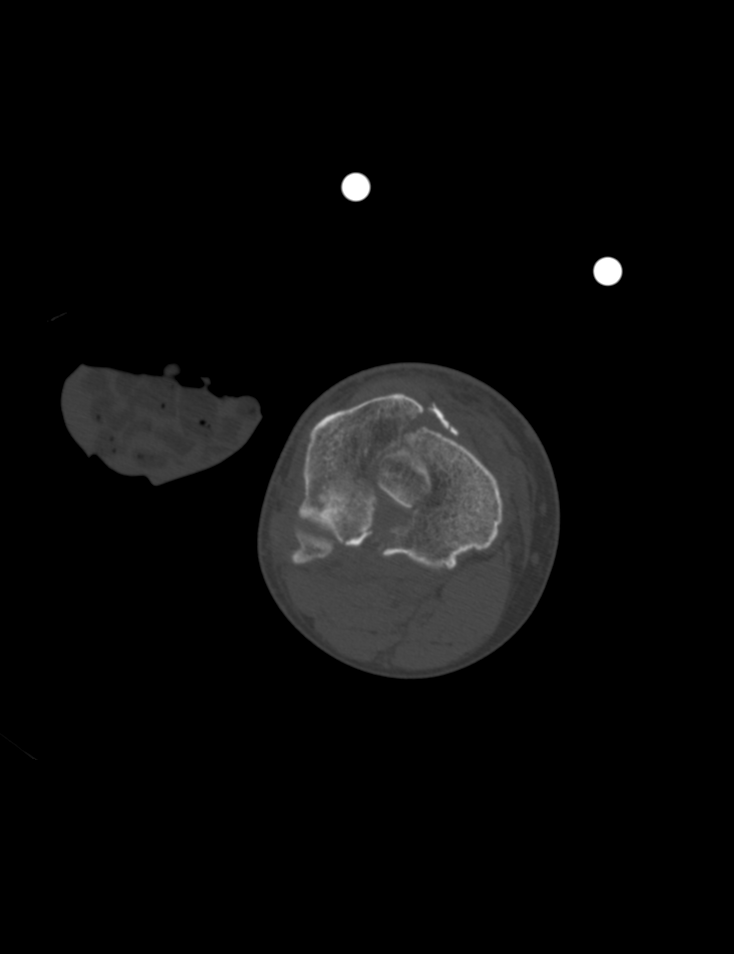
[im 122/163  bone]
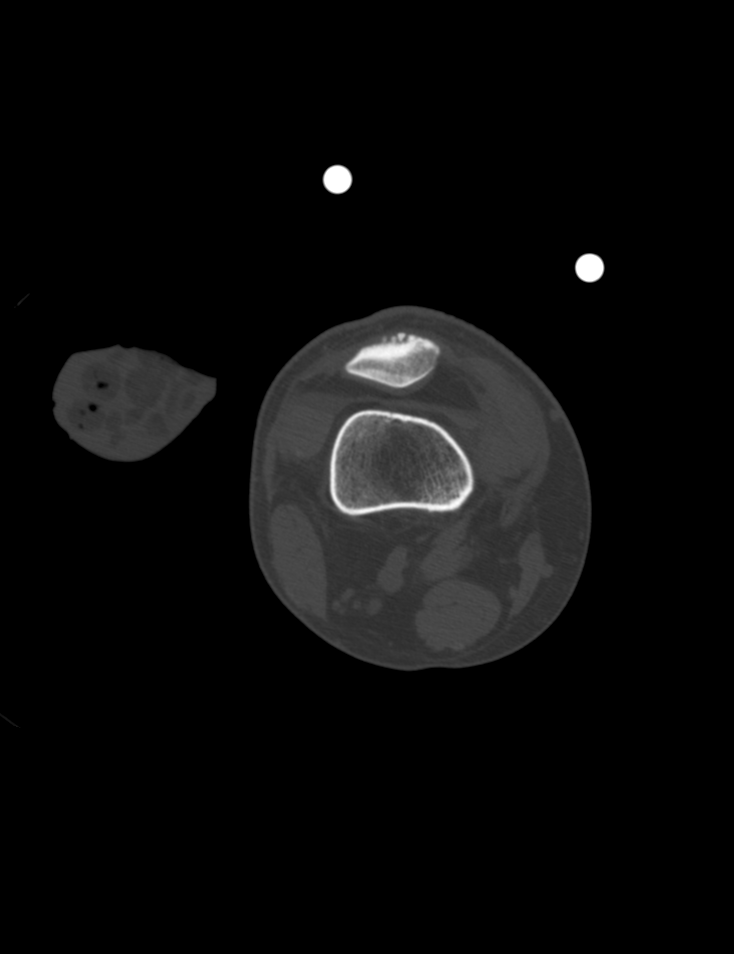

[6 of 33 positions shown; findings below may reference images not displayed]

FINDINGS: Bones/Joint/Cartilage

There is a comminuted fracture of the tibial plateau, with
metaphyseal disassociation and extension into the diaphysis. This is
compatible with an AO/OTA type C3 fracture, or a Schatzker type VI
fracture. There is approximately 1.7 cm of depression of a tibial
spine fragment at the lateral tibial plateau. Several fracture lines
extend through the tibial spine.

The proximal fibula appears intact. The distal femur remains intact.
Associated external fixation hardware is partially imaged. The
cartilage is difficult to fully assess on CT.

Ligaments

Suboptimally assessed by CT.

Muscles and Tendons

There appears to be vague soft tissue edema tracking along the
proximal medial gastrocnemius musculature, concerning for
intramuscular hematoma. Would follow the patient's symptoms
carefully, to exclude development of compartment syndrome.

Soft tissues

Note is made of soft tissue injury about the knee. A small to
moderate lipohemarthrosis is seen. The vasculature is not well
assessed without contrast.
IMPRESSION: 1. Comminuted fracture of the tibial plateau, with metaphyseal
dissociation and extension into the diaphysis. This is compatible
with an AO/OTA type C3 fracture, or a Schatzker type VI fracture.
1.7 cm depression of a tibial spine fragment, at the lateral tibial
plateau. Several fracture lines extend through the tibial spine.
2. Vague soft tissue edema tracking about the proximal medial
gastrocnemius musculature, concerning for intramuscular hematoma.
Would follow the patient's symptoms carefully, to exclude
development of compartment syndrome.
3. Small to moderate lipohemarthrosis noted.
4. Soft tissue injury noted about the knee.

## 2018-04-04 IMAGING — CR DG KNEE 1-2V PORT*R*
2 series · 2 of 2 positions shown · non-contrast
Comparison: [DATE]

CLINICAL DATA: Status post ORIF of tibial plateau fracture

EXAM:
PORTABLE RIGHT KNEE - 1-2 VIEW

[xtable lateral]
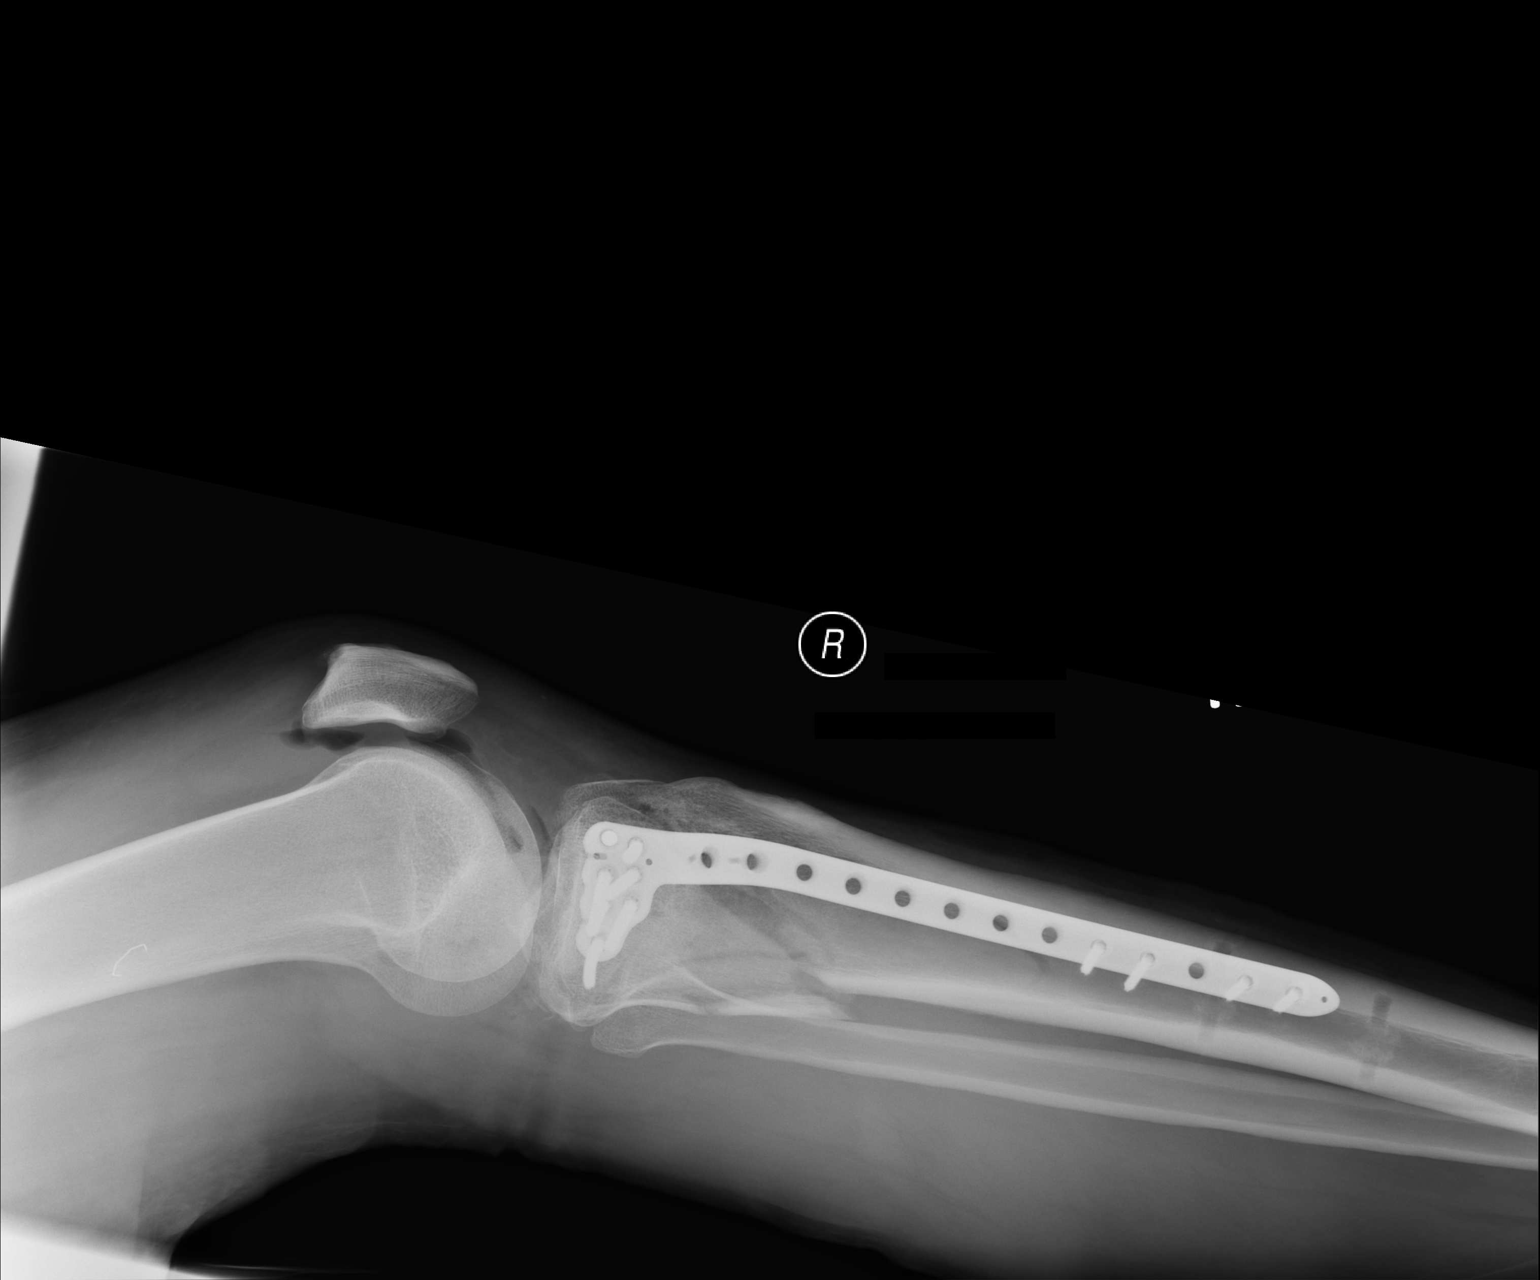

[AP]
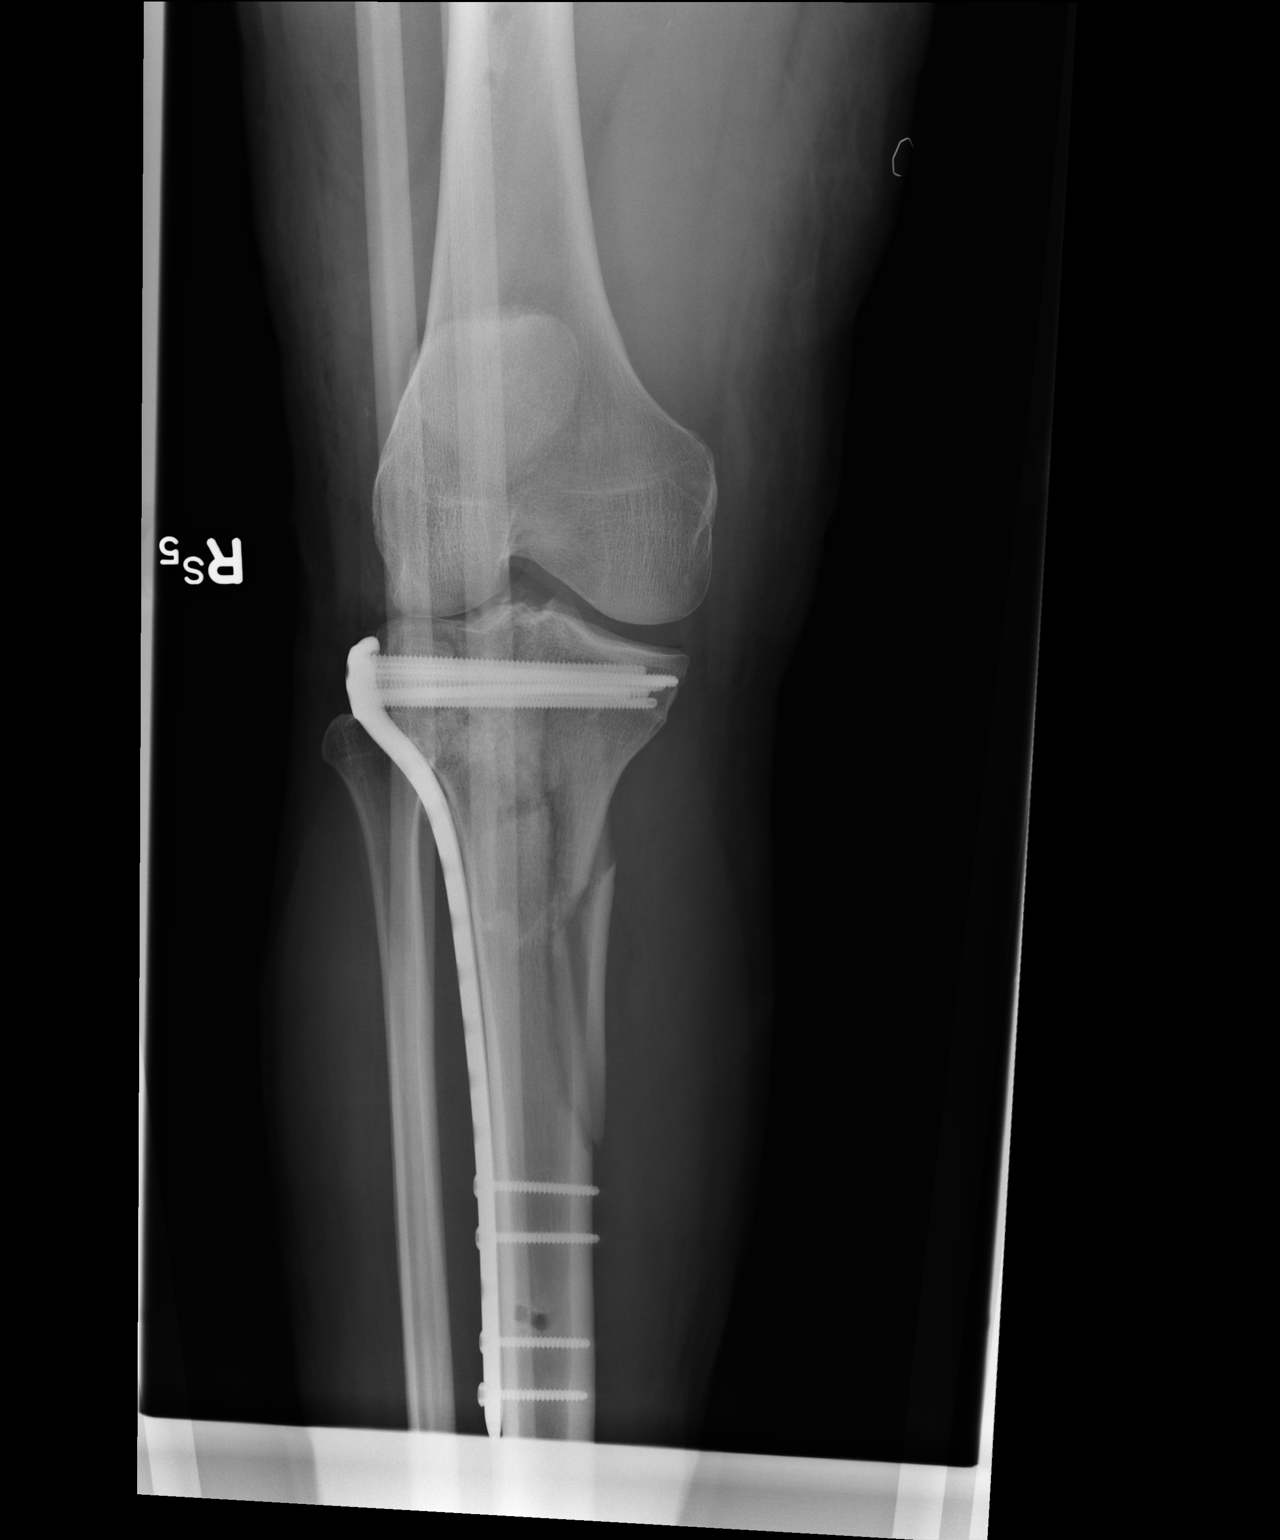

[2 of 2 positions shown; findings below may reference images not displayed]

FINDINGS: Interval open reduction and internal fixation of the comminuted
fracture deformity involving the proximal tibia with extension of
the fracture into the joint space. Screw and plate fixation has been
performed. The hardware components and the fracture fragments are in
anatomic alignment.
IMPRESSION: 1. Status post ORIF of comminuted proximal tibial fracture.

## 2018-04-18 DIAGNOSIS — E785 Hyperlipidemia, unspecified: Secondary | ICD-10-CM | POA: Diagnosis not present

## 2018-04-18 DIAGNOSIS — E669 Obesity, unspecified: Secondary | ICD-10-CM | POA: Diagnosis not present

## 2018-04-18 DIAGNOSIS — E1169 Type 2 diabetes mellitus with other specified complication: Secondary | ICD-10-CM | POA: Diagnosis not present

## 2018-04-18 DIAGNOSIS — Z6833 Body mass index (BMI) 33.0-33.9, adult: Secondary | ICD-10-CM | POA: Diagnosis not present

## 2018-04-18 DIAGNOSIS — I10 Essential (primary) hypertension: Secondary | ICD-10-CM | POA: Diagnosis not present

## 2018-04-25 DIAGNOSIS — H26493 Other secondary cataract, bilateral: Secondary | ICD-10-CM | POA: Diagnosis not present

## 2018-04-25 DIAGNOSIS — E113513 Type 2 diabetes mellitus with proliferative diabetic retinopathy with macular edema, bilateral: Secondary | ICD-10-CM | POA: Diagnosis not present

## 2018-04-28 DIAGNOSIS — E113513 Type 2 diabetes mellitus with proliferative diabetic retinopathy with macular edema, bilateral: Secondary | ICD-10-CM | POA: Diagnosis not present

## 2018-05-15 ENCOUNTER — Ambulatory Visit (INDEPENDENT_AMBULATORY_CARE_PROVIDER_SITE_OTHER): Payer: BLUE CROSS/BLUE SHIELD | Admitting: Orthopaedic Surgery

## 2018-05-15 ENCOUNTER — Ambulatory Visit (INDEPENDENT_AMBULATORY_CARE_PROVIDER_SITE_OTHER): Payer: BLUE CROSS/BLUE SHIELD

## 2018-05-15 ENCOUNTER — Encounter (INDEPENDENT_AMBULATORY_CARE_PROVIDER_SITE_OTHER): Payer: Self-pay | Admitting: Orthopaedic Surgery

## 2018-05-15 DIAGNOSIS — G8929 Other chronic pain: Secondary | ICD-10-CM

## 2018-05-15 DIAGNOSIS — M25561 Pain in right knee: Secondary | ICD-10-CM | POA: Diagnosis not present

## 2018-05-15 DIAGNOSIS — M1711 Unilateral primary osteoarthritis, right knee: Secondary | ICD-10-CM | POA: Diagnosis not present

## 2018-05-15 NOTE — Progress Notes (Signed)
oxy   Office Visit Note   Patient: Lawrence [name]           Date of Birth: 15-Jun-1965           MRN: [id number] Visit Date: 05/15/2018              Requested by: Lawrence Overly, MD [address] [place], [place] [address] PCP: Lawrence Overly, MD   Assessment & Plan: Visit Diagnoses:  1. Unilateral primary osteoarthritis, right knee   2. Chronic pain of right knee     Plan: I did aspirate 20 to 30 cc of fluid from his knee and place a steroid injection in the right knee.  He will watch his blood glucose closely.  I talked her about a knee replacement and showed him a knee model.  His would be a little more complicated because he would have to have the plate and screws taken out first.  All question concerns were answered and addressed.  We will see him back in 4 weeks to see how is doing overall.  Follow-Up Instructions: Return in about 4 weeks (around 06/12/2018).   Orders:  Orders Placed This Encounter  Procedures  . XR Knee 1-2 Views Right   No orders of the defined types were placed in this encounter.     Procedures: No procedures performed   Clinical Data: No additional findings.   Subjective: Chief Complaint  Patient presents with  . Right Knee - Follow-up  The patient is well-known to me.  We performed an arthroscopic surgery on his right knee about 8 months ago.  This knee was originally crushed and some type of accident in 2017 and he had external fixation placed and then eventually plating of the tibial plateau.  This was done by other surgeons.  He was then eventually knee replacement arthritic changes.  We performed off scopic intervention of his knee this year and did find patellofemoral arthritic changes and mainly lateral compartment changes.  He has been having some recent swelling in his knee again and has been bothering him.  He does offload with an assistive device.  He cannot take anti-inflammatories due to kidney disease.  His blood glucose has  been running a little bit high.  HPI  Review of Systems He currently denies any headache, chest pain, shortness of breath, fever, chills, nausea, vomiting.  Objective: Vital Signs: There were no vitals taken for this visit.  Physical Exam He is alert and orient x3 and in no acute distress Ortho Exam Examination of his right knee does show mild effusion.  There is a well-healed lateral surgical incision.  He lacks full extension by few degrees and can flex fully.  The knee feels stable ligamentously. Specialty Comments:  No specialty comments available.  Imaging: Xr Knee 1-2 Views Right  Result Date: 05/15/2018 3 views of the right knee show intact hardware from plating the lateral tibial plateau fracture that was done remotely.  There is no increase in terms of worsening arthritis of his knee.  The alignment is well-maintained.  The fracture is healed completely    PMFS History: Patient Active Problem List   Diagnosis Date Noted  . Unilateral primary osteoarthritis, right knee 09/19/2017  . Chronic pain of right knee 09/19/2017  . Status post arthroscopy of right knee 09/19/2017  . Fall 03/08/2016  . Fever 03/05/2016  . Diabetes ([place]) 02/28/2016  . Tibial plateau fracture 02/27/2016   Past Medical History:  Diagnosis Date  .  Diabetes mellitus without complication ([place])     History reviewed. No pertinent family history.  Past Surgical History:  Procedure Laterality Date  . EXTERNAL FIXATION LEG Right 02/27/2016   Procedure: EXTERNAL FIXATION LEG;  Surgeon: Netta Cedars, MD;  Location: [place];  Service: Orthopedics;  Laterality: Right;  reduction and external fixation right tibial plateau fracture  . EXTERNAL FIXATION REMOVAL Right 03/04/2016   Procedure: REMOVAL EXTERNAL FIXATION LEG;  Surgeon: Altamese Matinecock, MD;  Location: [place];  Service: Orthopedics;  Laterality: Right;  . HARVEST BONE GRAFT Left 03/04/2016   Procedure: Rimmed IM aspirate;  Surgeon: Altamese [place], MD;   Location: [place];  Service: Orthopedics;  Laterality: Left;  . ORIF TIBIA PLATEAU Right 03/04/2016   Procedure: OPEN REDUCTION INTERNAL FIXATION (ORIF) TIBIAL PLATEAU;  Surgeon: Altamese Hambleton, MD;  Location: [place];  Service: Orthopedics;  Laterality: Right;   Social History   Occupational History  . Not on file  Tobacco Use  . Smoking status: Never Smoker  . Smokeless tobacco: Never Used  Substance and Sexual Activity  . Alcohol use: Yes  . Drug use: No  . Sexual activity: Yes

## 2018-05-24 DIAGNOSIS — E113513 Type 2 diabetes mellitus with proliferative diabetic retinopathy with macular edema, bilateral: Secondary | ICD-10-CM | POA: Diagnosis not present

## 2018-06-12 ENCOUNTER — Ambulatory Visit (INDEPENDENT_AMBULATORY_CARE_PROVIDER_SITE_OTHER): Payer: BLUE CROSS/BLUE SHIELD | Admitting: Orthopaedic Surgery

## 2018-06-12 ENCOUNTER — Encounter (INDEPENDENT_AMBULATORY_CARE_PROVIDER_SITE_OTHER): Payer: Self-pay | Admitting: Orthopaedic Surgery

## 2018-06-12 DIAGNOSIS — Z969 Presence of functional implant, unspecified: Secondary | ICD-10-CM | POA: Diagnosis not present

## 2018-06-12 DIAGNOSIS — M1711 Unilateral primary osteoarthritis, right knee: Secondary | ICD-10-CM | POA: Diagnosis not present

## 2018-06-12 DIAGNOSIS — G8929 Other chronic pain: Secondary | ICD-10-CM | POA: Diagnosis not present

## 2018-06-12 DIAGNOSIS — M25561 Pain in right knee: Secondary | ICD-10-CM

## 2018-06-12 NOTE — Progress Notes (Signed)
The patient is now 2 years out from open reduction-internal fixation of complex right tibial plateau fracture.  This was done by Dr. Marcelino Scot.  We have been seeing Lawrence [name] due to to posttraumatic arthritis of his right knee.  He is tried and failed all forms conservative treatment and still has symptomatic hardware as well as knee osteoarthritis.  At his last visit we aspirated fluid from the knee and place a steroid injection in the knee and this did help.  We have also performed arthroscopic surgery on his knee and had numerous injections placed in the knee.  He still ambulates with a cane and has problems with right knee pain and instability.  On exam there is no effusion today.  His right knee flexes and extends well but there is pain on exam.  He has significant muscle atrophy of the quads.  He has well-healed surgical incisions.  At this point he is interested with having the hardware removed and eventually having a knee replacement.  Given the extent of the hardware in his knee I would recommend hardware removal in a staged format and not place a knee replacement in the same setting.  I would wait for Lawrence [name] to heal the incisions from the hardware removal prior to then later setting Lawrence [name] up for knee replacement and he understands that as well.  He also would rather have the hardware removed first and not jump to knee replacement just yet.  He would like to have this done in about 2 months from now.  We can work on getting this scheduled.  All question concerns were answered and addressed.

## 2018-06-19 ENCOUNTER — Ambulatory Visit (INDEPENDENT_AMBULATORY_CARE_PROVIDER_SITE_OTHER): Payer: BLUE CROSS/BLUE SHIELD | Admitting: Orthopaedic Surgery

## 2018-08-07 DIAGNOSIS — E113513 Type 2 diabetes mellitus with proliferative diabetic retinopathy with macular edema, bilateral: Secondary | ICD-10-CM | POA: Diagnosis not present

## 2018-08-07 DIAGNOSIS — E113511 Type 2 diabetes mellitus with proliferative diabetic retinopathy with macular edema, right eye: Secondary | ICD-10-CM | POA: Diagnosis not present

## 2018-08-07 DIAGNOSIS — E113512 Type 2 diabetes mellitus with proliferative diabetic retinopathy with macular edema, left eye: Secondary | ICD-10-CM | POA: Diagnosis not present

## 2018-10-27 DIAGNOSIS — E669 Obesity, unspecified: Secondary | ICD-10-CM | POA: Diagnosis not present

## 2018-10-27 DIAGNOSIS — Z6830 Body mass index (BMI) 30.0-30.9, adult: Secondary | ICD-10-CM | POA: Diagnosis not present

## 2018-10-27 DIAGNOSIS — E1169 Type 2 diabetes mellitus with other specified complication: Secondary | ICD-10-CM | POA: Diagnosis not present

## 2018-10-27 DIAGNOSIS — K5641 Fecal impaction: Secondary | ICD-10-CM | POA: Diagnosis not present

## 2018-10-27 DIAGNOSIS — K5900 Constipation, unspecified: Secondary | ICD-10-CM | POA: Diagnosis not present

## 2018-11-13 ENCOUNTER — Encounter: Payer: Self-pay | Admitting: Gastroenterology

## 2018-11-13 ENCOUNTER — Other Ambulatory Visit: Payer: Self-pay

## 2018-11-13 ENCOUNTER — Ambulatory Visit (INDEPENDENT_AMBULATORY_CARE_PROVIDER_SITE_OTHER): Payer: Medicare Other | Admitting: Gastroenterology

## 2018-11-13 VITALS — Ht 70.0 in | Wt 190.0 lb

## 2018-11-13 DIAGNOSIS — R194 Change in bowel habit: Secondary | ICD-10-CM | POA: Diagnosis not present

## 2018-11-13 DIAGNOSIS — K5900 Constipation, unspecified: Secondary | ICD-10-CM

## 2018-11-13 MED ORDER — PEG 3350-KCL-NA BICARB-NACL 420 G PO SOLR: 4000.0000 mL | ORAL | 0 refills | Status: DC

## 2018-11-13 NOTE — Patient Instructions (Addendum)
We will arrange a colonoscopy at his soonest convenience for change in bowel habits, constipation  In the meantime he will start Citrucel fiber supplement daily.  He will stay hydrated as well.  He will use MiraLAX 1 dose or more as needed for rescue.  We will reach out to his [place] care team to get the results of his recent lab tests that were drawn in the past couple weeks as well as his recent abdominal films.  Thank you for entrusting me with your care and choosing [facility].  Dr Ardis Hughs

## 2018-11-13 NOTE — Progress Notes (Signed)
This service was provided via virtual visit.  Both audio and visual were used. The patient was located at home.  I was located in my office.  His daughter was with him.  The patient did consent to this virtual visit and is aware of possible charges through their insurance for this visit.  The patient is a new patient.  My certified medical assistant, Grace Bushy, contributed to this visit by contacting the patient by phone 1 or 2 business days prior to the appointment and also followed up on the recommendations I made after the visit.  Time spent on virtual visit: 24 min   HPI: This is a very pleasant 53 year old man whom I been meeting for the first time via this telemedicine visit.  His daughter is a friend of Desiree who works in our office.  His daughter was on the telemedicine visit as well.  She helped periodically with translation however his English was quite good actually.  He has had about 6 weeks of significant constipation.  Shortly before this he took some herbal supplements that he felt would help his diabetes.  These both contain chromium.  As soon as his constipation started he stopped both of the supplements.  He tried MiraLAX, I am not sure exactly how much but it sounds like it led to explosive diarrhea for a day and temporary dehydration.  He does not think he will move his bowels at all without laxatives.  As the days ago by after 3 or 4 days he gets more more bloated and finally takes a laxative with relief.  He has not had any bleeding.  He really does not have any abdominal pains except for the bloating that improves once he has a bowel movement.  He feels he has lost about 20 pounds over the past couple months.  Does admit to a lot of diet changes in the pandemic quarantine restrictions started.  He had lab tests and x-rays at [facility] care center about 2 weeks ago.  I do not have any those results at the time of this visit.  His daughter read some of the results and certainly  the two-view abdominal plain films were normal.  He injuried his leg in 2017; had been on pain meds. Has been a lot more sedentary since the quarantine.    Chief complaint is change in bowel habits, constipation  ROS: complete GI ROS as described in HPI, all other review negative.  Constitutional:  No unintentional weight loss   Past Medical History:  Diagnosis Date  . Diabetes mellitus without complication [facility])     Past Surgical History:  Procedure Laterality Date  . EXTERNAL FIXATION LEG Right 02/27/2016   Procedure: EXTERNAL FIXATION LEG;  Surgeon: Netta Cedars, MD;  Location: [place];  Service: Orthopedics;  Laterality: Right;  reduction and external fixation right tibial plateau fracture  . EXTERNAL FIXATION REMOVAL Right 03/04/2016   Procedure: REMOVAL EXTERNAL FIXATION LEG;  Surgeon: Altamese [place], MD;  Location: [place];  Service: Orthopedics;  Laterality: Right;  . HARVEST BONE GRAFT Left 03/04/2016   Procedure: Rimmed IM aspirate;  Surgeon: Altamese [place], MD;  Location: [place];  Service: Orthopedics;  Laterality: Left;  . ORIF TIBIA PLATEAU Right 03/04/2016   Procedure: OPEN REDUCTION INTERNAL FIXATION (ORIF) TIBIAL PLATEAU;  Surgeon: Altamese [place], MD;  Location: [place];  Service: Orthopedics;  Laterality: Right;    Current Outpatient Medications  Medication Sig Dispense Refill  . acetaminophen (TYLENOL) 500 MG tablet Take 2  tablets (1,000 mg total) by mouth every 6 (six) hours. 90 tablet 0  . blood glucose meter kit and supplies KIT Dispense based on patient and insurance preference. Use up to four times daily as directed. (FOR ICD-9 250.00, 250.01). 1 each 0  . calcium citrate (CALCITRATE - DOSED IN MG ELEMENTAL CALCIUM) 950 MG tablet Take 1 tablet (200 mg of elemental calcium total) by mouth 2 (two) times daily. 60 tablet 2  . docusate sodium (COLACE) 100 MG capsule Take 1 capsule (100 mg total) by mouth 2 (two) times daily. 20 capsule 0  . insulin glargine (LANTUS) 100  UNIT/ML injection Inject 0.18 mLs (18 Units total) into the skin at bedtime. 10 mL 11  . metFORMIN (GLUCOPHAGE) 500 MG tablet Take 500 mg by mouth. 2 bid     No current facility-administered medications for this visit.     Allergies as of 11/13/2018  . (No Known Allergies)    No family history on file.  Social History   Socioeconomic History  . Marital status: Married    Spouse name: Not on file  . Number of children: Not on file  . Years of education: Not on file  . Highest education level: Not on file  Occupational History  . Not on file  Social Needs  . Financial resource strain: Not on file  . Food insecurity:    Worry: Not on file    Inability: Not on file  . Transportation needs:    Medical: Not on file    Non-medical: Not on file  Tobacco Use  . Smoking status: Never Smoker  . Smokeless tobacco: Never Used  Substance and Sexual Activity  . Alcohol use: Yes  . Drug use: No  . Sexual activity: Yes  Lifestyle  . Physical activity:    Days per week: Not on file    Minutes per session: Not on file  . Stress: Not on file  Relationships  . Social connections:    Talks on phone: Not on file    Gets together: Not on file    Attends religious service: Not on file    Active member of club or organization: Not on file    Attends meetings of clubs or organizations: Not on file    Relationship status: Not on file  . Intimate partner violence:    Fear of current or ex partner: Not on file    Emotionally abused: Not on file    Physically abused: Not on file    Forced sexual activity: Not on file  Other Topics Concern  . Not on file  Social History Narrative  . Not on file     Physical Exam: Unable to perform because this was a "telemed visit" due to current Covid-19 pandemic  Assessment and plan: 53 y.o. male with constipation, change in bowel habits  First we will reach out to his [place] care team to get the results of his recent lab tests and abdominal  films that were done in the past couple weeks.  I recommended that he stop all herbal and vitamin supplements as well as all of the agents he is currently using to affect his bowels and instead I asked him to start a single scoop of Citrucel powder fiber supplement on a daily basis, stay hydrated, use MiraLAX on a rescue as needed basis for his constipation.  We will arrange a colonoscopy to be performed at his soonest convenience as well.  I see no reason for  any further blood tests or imaging studies prior to then however if the labs that we received from [place] are not sufficient he will likely need further testing.  Please see the "Patient Instructions" section for addition details about the plan.  Owens Loffler, MD [facility] 11/13/2018, 3:44 PM

## 2018-11-17 ENCOUNTER — Telehealth: Payer: Self-pay | Admitting: Gastroenterology

## 2018-11-17 NOTE — Telephone Encounter (Signed)
Patient and daughter notified that I do not see results in Dr. Ardis Hughs office today.  She is advised that we will check with Dr. Ardis Hughs nurse on Monday   Lawrence [name], have you seen the records?

## 2018-11-17 NOTE — Telephone Encounter (Signed)
Pt would like to know if lab works and X ray from [facility] were received.

## 2018-11-20 NOTE — Telephone Encounter (Signed)
Spoke with medical records from [facility]. They are faxing over Labs and abdominal imaging from June.

## 2018-11-21 ENCOUNTER — Telehealth: Payer: Self-pay | Admitting: Gastroenterology

## 2018-11-21 ENCOUNTER — Telehealth: Payer: Self-pay | Admitting: *Deleted

## 2018-11-21 NOTE — Telephone Encounter (Addendum)

## 2018-11-21 NOTE — Telephone Encounter (Signed)
Called and spoke with pt. Daughter and she said ,she will be able to bring pt. In for 10:30 am for 11:30 am procedure.Instructed daughter on pt taking second half of prep starting at 5:30 am and nothing by mouth after 8:30 am and to make sure pt. Do not take any of his diabetic medication on tomorrow,she verbalize understanding.

## 2018-11-22 ENCOUNTER — Other Ambulatory Visit: Payer: Self-pay | Admitting: Gastroenterology

## 2018-11-22 ENCOUNTER — Other Ambulatory Visit: Payer: Self-pay

## 2018-11-22 ENCOUNTER — Ambulatory Visit (AMBULATORY_SURGERY_CENTER): Payer: Medicare Other | Admitting: Gastroenterology

## 2018-11-22 ENCOUNTER — Encounter: Payer: Self-pay | Admitting: Gastroenterology

## 2018-11-22 ENCOUNTER — Other Ambulatory Visit (INDEPENDENT_AMBULATORY_CARE_PROVIDER_SITE_OTHER): Payer: Medicare Other

## 2018-11-22 ENCOUNTER — Telehealth: Payer: Self-pay | Admitting: Gastroenterology

## 2018-11-22 VITALS — BP 135/67 | HR 81 | Temp 99.3°F | Resp 18 | Ht 70.0 in | Wt 190.0 lb

## 2018-11-22 DIAGNOSIS — K5900 Constipation, unspecified: Secondary | ICD-10-CM

## 2018-11-22 LAB — CBC WITH DIFFERENTIAL/PLATELET
Basophils Absolute: 0.1 10*3/uL (ref 0.0–0.1)
Basophils Relative: 1.6 % (ref 0.0–3.0)
Eosinophils Absolute: 0.3 10*3/uL (ref 0.0–0.7)
Eosinophils Relative: 4.5 % (ref 0.0–5.0)
HCT: 35.4 % — ABNORMAL LOW (ref 39.0–52.0)
Hemoglobin: 12.2 g/dL — ABNORMAL LOW (ref 13.0–17.0)
Lymphocytes Relative: 25.9 % (ref 12.0–46.0)
Lymphs Abs: 1.6 10*3/uL (ref 0.7–4.0)
MCHC: 34.6 g/dL (ref 30.0–36.0)
MCV: 86.6 fl (ref 78.0–100.0)
Monocytes Absolute: 0.5 10*3/uL (ref 0.1–1.0)
Monocytes Relative: 7.3 % (ref 3.0–12.0)
Neutro Abs: 3.8 10*3/uL (ref 1.4–7.7)
Neutrophils Relative %: 60.7 % (ref 43.0–77.0)
Platelets: 249 10*3/uL (ref 150.0–400.0)
RBC: 4.09 Mil/uL — ABNORMAL LOW (ref 4.22–5.81)
RDW: 13.7 % (ref 11.5–15.5)
WBC: 6.3 10*3/uL (ref 4.0–10.5)

## 2018-11-22 LAB — TSH: TSH: 1.31 u[IU]/mL (ref 0.35–4.50)

## 2018-11-22 MED ORDER — SODIUM CHLORIDE 0.9 % IV SOLN: 500.0000 mL | Freq: Once | INTRAVENOUS | Status: DC

## 2018-11-22 MED ORDER — LINACLOTIDE 145 MCG PO CAPS: 145.0000 ug | ORAL_CAPSULE | Freq: Every day | ORAL | 6 refills | Status: DC

## 2018-11-22 NOTE — Progress Notes (Signed)
PT taken to PACU. Monitors in place. VSS. Report given to RN. 

## 2018-11-22 NOTE — Patient Instructions (Signed)
Normal colonoscopy  YOU HAD AN ENDOSCOPIC PROCEDURE TODAY AT [facility]:   Refer to the procedure report that was given to you for any specific questions about what was found during the examination.  If the procedure report does not answer your questions, please call your gastroenterologist to clarify.  If you requested that your care partner not be given the details of your procedure findings, then the procedure report has been included in a sealed envelope for you to review at your convenience later.  YOU SHOULD EXPECT: Some feelings of bloating in the abdomen. Passage of more gas than usual.  Walking can help get rid of the air that was put into your GI tract during the procedure and reduce the bloating. If you had a lower endoscopy (such as a colonoscopy or flexible sigmoidoscopy) you may notice spotting of blood in your stool or on the toilet paper. If you underwent a bowel prep for your procedure, you may not have a normal bowel movement for a few days.  Please Note:  You might notice some irritation and congestion in your nose or some drainage.  This is from the oxygen used during your procedure.  There is no need for concern and it should clear up in a day or so.  SYMPTOMS TO REPORT IMMEDIATELY:   Following lower endoscopy (colonoscopy or flexible sigmoidoscopy):  Excessive amounts of blood in the stool  Significant tenderness or worsening of abdominal pains  Swelling of the abdomen that is new, acute  Fever of 100F or higher  For urgent or emergent issues, a gastroenterologist can be reached at any hour by calling [phone].   DIET:  We do recommend a small meal at first, but then you may proceed to your regular diet.  Drink plenty of fluids but you should avoid alcoholic beverages for 24 hours.  ACTIVITY:  You should plan to take it easy for the rest of today and you should NOT DRIVE or use heavy machinery until tomorrow (because of the sedation medicines used  during the test).    FOLLOW UP: Our staff will call the number listed on your records 48-72 hours following your procedure to check on you and address any questions or concerns that you may have regarding the information given to you following your procedure. If we do not reach you, we will leave a message.  We will attempt to reach you two times.  During this call, we will ask if you have developed any symptoms of COVID 19. If you develop any symptoms (ie: fever, flu-like symptoms, shortness of breath, cough etc.) before then, please call [phone].  If you test positive for Covid 19 in the 2 weeks post procedure, please call and report this information to [place].    If any biopsies were taken you will be contacted by phone or by letter within the next 1-3 weeks.  Please call us at [phone] if you have not heard about the biopsies in 3 weeks.    SIGNATURES/CONFIDENTIALITY: You and/or your care partner have signed paperwork which will be entered into your electronic medical record.  These signatures attest to the fact that that the information above on your After Visit Summary has been reviewed and is understood.  Full responsibility of the confidentiality of this discharge information lies with you and/or your care-partner.

## 2018-11-22 NOTE — Progress Notes (Signed)
Lab orders per Dr Ardis Hughs entered , patient in [facility] now.

## 2018-11-22 NOTE — Telephone Encounter (Signed)
I spoke to patient. He does not have insurance that covers medication. Samples of Linzess 145 mcq provided,patient will pick up tomorrow. I will reach out to [place] for medication assistance.

## 2018-11-22 NOTE — Telephone Encounter (Signed)
Pt stated that he doesn't have prescription coverage and requested an alt for Linzess.

## 2018-11-22 NOTE — Op Note (Signed)
[facility] Patient Name: Lawrence [name] Procedure Date: 11/22/2018 11:13 AM MRN: [id number] Endoscopist: Milus Banister , MD Age: 53 Referring MD:  Date of Birth: [birth year] Gender: Male Account #: [id number] Procedure:                Colonoscopy Indications:              Constipation; testing from [facility] 2 view                            abd normal, CMET normal, Hb A1c 9.3 Medicines:                Monitored Anesthesia Care Procedure:                Pre-Anesthesia Assessment:                           - Prior to the procedure, a History and Physical                            was performed, and patient medications and                            allergies were reviewed. The patient's tolerance of                            previous anesthesia was also reviewed. The risks                            and benefits of the procedure and the sedation                            options and risks were discussed with the patient.                            All questions were answered, and informed consent                            was obtained. Prior Anticoagulants: The patient has                            taken no previous anticoagulant or antiplatelet                            agents. ASA Grade Assessment: II - A patient with                            mild systemic disease. After reviewing the risks                            and benefits, the patient was deemed in                            satisfactory condition to undergo the procedure.  After obtaining informed consent, the colonoscope                            was passed under direct vision. Throughout the                            procedure, the patient's blood pressure, pulse, and                            oxygen saturations were monitored continuously. The                            Colonoscope was introduced through the anus and                            advanced to the the cecum,  identified by                            appendiceal orifice and ileocecal valve. The                            colonoscopy was performed without difficulty. The                            patient tolerated the procedure well. The quality                            of the bowel preparation was good. The ileocecal                            valve, appendiceal orifice, and rectum were                            photographed. Scope In: 11:22:31 AM Scope Out: 11:33:05 AM Scope Withdrawal Time: 0 hours 7 minutes 6 seconds  Total Procedure Duration: 0 hours 10 minutes 34 seconds  Findings:                 The entire examined colon appeared normal on direct                            and retroflexion views.                           No polyps or cancers. Complications:            No immediate complications. Estimated blood loss:                            None. Estimated Blood Loss:     Estimated blood loss: none. Impression:               - The entire examined colon is normal on direct and                            retroflexion views.                           -  No polyps or cancers. Recommendation:           - Patient has a contact number available for                            emergencies. The signs and symptoms of potential                            delayed complications were discussed with the                            patient. Return to normal activities tomorrow.                            Written discharge instructions were provided to the                            patient.                           - Resume previous diet.                           - Continue present medications.                           - Please resume once daily fiber supplement with                            citrucel. Also start linzess 190mcg pills, one pill                            once daily, disp 30 with 6 refills (new script                            written today).                           - You  need further blood testing (today if                            possible) CBC, TSH                           - Repeat colonoscopy in 10 years for screening. Milus Banister, MD 11/22/2018 11:40:23 AM This report has been signed electronically.

## 2018-11-24 ENCOUNTER — Telehealth: Payer: Self-pay | Admitting: *Deleted

## 2018-11-24 ENCOUNTER — Telehealth: Payer: Self-pay | Admitting: Gastroenterology

## 2018-11-24 NOTE — Telephone Encounter (Signed)
Patient is returning your call about lab results. 

## 2018-11-24 NOTE — Telephone Encounter (Signed)
  Follow up Call-  Call back number 11/22/2018  Post procedure Call Back phone  # [phone] home number [phone]  Permission to leave phone message Yes  Some recent data might be hidden     Patient questions:  Do you have a fever, pain , or abdominal swelling? No. Pain Score  0 *  Have you tolerated food without any problems? Yes.    Have you been able to return to your normal activities? Yes.    Do you have any questions about your discharge instructions: Diet   No. Medications  No. Follow up visit  No.  Do you have questions or concerns about your Care? Yes.    Actions: * If pain score is 4 or above: No action needed, pain <4.  1. Have you developed a fever since your procedure? no  2.   Have you had an respiratory symptoms (SOB or cough) since your procedure? no  3.   Have you tested positive for COVID 19 since your procedure no  4.   Have you had any family members/close contacts diagnosed with the COVID 19 since your procedure?  no   If yes to any of these questions please route to Joylene John, RN and Alphonsa Gin, Therapist, sports.

## 2019-01-05 ENCOUNTER — Ambulatory Visit (INDEPENDENT_AMBULATORY_CARE_PROVIDER_SITE_OTHER): Payer: Medicare Other | Admitting: Gastroenterology

## 2019-01-05 DIAGNOSIS — E113513 Type 2 diabetes mellitus with proliferative diabetic retinopathy with macular edema, bilateral: Secondary | ICD-10-CM | POA: Diagnosis not present

## 2019-01-05 DIAGNOSIS — K5900 Constipation, unspecified: Secondary | ICD-10-CM

## 2019-01-05 DIAGNOSIS — E113511 Type 2 diabetes mellitus with proliferative diabetic retinopathy with macular edema, right eye: Secondary | ICD-10-CM | POA: Diagnosis not present

## 2019-01-05 DIAGNOSIS — E113512 Type 2 diabetes mellitus with proliferative diabetic retinopathy with macular edema, left eye: Secondary | ICD-10-CM | POA: Diagnosis not present

## 2019-01-05 NOTE — Progress Notes (Signed)
Review of pertinent gastrointestinal problems: 1.  Constipation, work-up 2020 included 2 view plain films at [facility] which were normal, complete metabolic profile which was normal. Cbc, TSH normal. Hemoglobin A1c was 9.3.  Colonoscopy Dr. Ardis Hughs June 2020 showed a completely normal colon.  I recommended that he resume daily fiber supplements and also start Linzess 145 mcg pills 1 pill once daily.   This service was provided via virtual visit.  I tried him through the agreed-upon audiovisual app however he did not answer.  I left a message on his cell phone.  I then called him at his home number and I believe it was his daughter that answered and she connected [place] on a 3-week call.  I am not sure where he was located.  He sounded like he was out and about in the community somewhere.  I was located in my office.  The patient did consent to this virtual visit and is aware of possible charges through their insurance for this visit.  The patient is an established patient.  My certified medical assistant, Grace Bushy, contributed to this visit by contacting the patient by phone 1 or 2 business days prior to the appointment and also followed up on the recommendations I made after the visit.  Time spent on virtual visit: 21 minutes   HPI: This is a very pleasant 53 year old man whom I last saw the time of a colonoscopy about 2 months ago.  See that results summarized above  Since then he has been eating a lot of vegetables he stopped eating red meat.  He has been taking Citrucel over-the-counter powder fiber supplement about every other day.  He did take Linzess for about 2 weeks but felt that it was too powerful for him and so he stopped.  He is overall pretty happy with the way his bowel habits have been since he made the dietary changes and started taking a fiber supplement.  Chief complaint is constipation  ROS: complete GI ROS as described in HPI, all other review  negative.  Constitutional:  No unintentional weight loss   Past Medical History:  Diagnosis Date  . Diabetes mellitus without complication [facility])     Past Surgical History:  Procedure Laterality Date  . EXTERNAL FIXATION LEG Right 02/27/2016   Procedure: EXTERNAL FIXATION LEG;  Surgeon: Netta Cedars, MD;  Location: [place];  Service: Orthopedics;  Laterality: Right;  reduction and external fixation right tibial plateau fracture  . EXTERNAL FIXATION REMOVAL Right 03/04/2016   Procedure: REMOVAL EXTERNAL FIXATION LEG;  Surgeon: Altamese Seama, MD;  Location: [place];  Service: Orthopedics;  Laterality: Right;  . HARVEST BONE GRAFT Left 03/04/2016   Procedure: Rimmed IM aspirate;  Surgeon: Altamese Winn, MD;  Location: [place];  Service: Orthopedics;  Laterality: Left;  . ORIF TIBIA PLATEAU Right 03/04/2016   Procedure: OPEN REDUCTION INTERNAL FIXATION (ORIF) TIBIAL PLATEAU;  Surgeon: Altamese Maxwell, MD;  Location: [place];  Service: Orthopedics;  Laterality: Right;    Current Outpatient Medications  Medication Sig Dispense Refill  . acetaminophen (TYLENOL) 500 MG tablet Take 2 tablets (1,000 mg total) by mouth every 6 (six) hours. 90 tablet 0  . blood glucose meter kit and supplies KIT Dispense based on patient and insurance preference. Use up to four times daily as directed. (FOR ICD-9 250.00, 250.01). 1 each 0  . calcium citrate (CALCITRATE - DOSED IN MG ELEMENTAL CALCIUM) 950 MG tablet Take 1 tablet (200 mg of elemental calcium total) by mouth  2 (two) times daily. 60 tablet 2  . docusate sodium (COLACE) 100 MG capsule Take 1 capsule (100 mg total) by mouth 2 (two) times daily. 20 capsule 0  . insulin glargine (LANTUS) 100 UNIT/ML injection Inject 0.18 mLs (18 Units total) into the skin at bedtime. 10 mL 11  . linaclotide (LINZESS) 145 MCG CAPS capsule Take 1 capsule (145 mcg total) by mouth daily before breakfast. 30 capsule 6  . metFORMIN (GLUCOPHAGE) 500 MG tablet Take 500 mg by mouth. 2 bid      Current Facility-Administered Medications  Medication Dose Route Frequency Provider Last Rate Last Dose  . 0.9 %  sodium chloride infusion  500 mL Intravenous Once Milus Banister, MD        Allergies as of 01/05/2019  . (No Known Allergies)    No family history on file.  Social History   Socioeconomic History  . Marital status: Married    Spouse name: Not on file  . Number of children: Not on file  . Years of education: Not on file  . Highest education level: Not on file  Occupational History  . Not on file  Social Needs  . Financial resource strain: Not on file  . Food insecurity    Worry: Not on file    Inability: Not on file  . Transportation needs    Medical: Not on file    Non-medical: Not on file  Tobacco Use  . Smoking status: Never Smoker  . Smokeless tobacco: Never Used  Substance and Sexual Activity  . Alcohol use: Yes  . Drug use: No  . Sexual activity: Yes  Lifestyle  . Physical activity    Days per week: Not on file    Minutes per session: Not on file  . Stress: Not on file  Relationships  . Social Herbalist on phone: Not on file    Gets together: Not on file    Attends religious service: Not on file    Active member of club or organization: Not on file    Attends meetings of clubs or organizations: Not on file    Relationship status: Not on file  . Intimate partner violence    Fear of current or ex partner: Not on file    Emotionally abused: Not on file    Physically abused: Not on file    Forced sexual activity: Not on file  Other Topics Concern  . Not on file  Social History Narrative  . Not on file     Physical Exam: Unable to perform because this was a "telemed visit" due to current Covid-19 pandemic  Assessment and plan: 53 y.o. male with functional constipation  He is pretty happy with the way his bowels are working on fiber supplement every other day as well as dietary modifications including cutting out red  meat, cutting out Posta's and rices and adding more vegetables to his diet.  I recommend he continue these changes since this seems to be helping.  I also suggested that if he is having further difficulty with constipation that he should try increasing the fiber supplement to a daily basis rather than every other day basis.  He knows to contact me if he has any further questions or problems.  Please see the "Patient Instructions" section for addition details about the plan.  Owens Loffler, MD [facility] 01/05/2019, 9:52 AM

## 2019-01-05 NOTE — Patient Instructions (Addendum)
He knows to contact me if he has any further questions or concerns.  Thank you for entrusting me with your care and choosing [facility].  Dr Ardis Hughs

## 2019-03-02 DIAGNOSIS — E113513 Type 2 diabetes mellitus with proliferative diabetic retinopathy with macular edema, bilateral: Secondary | ICD-10-CM | POA: Diagnosis not present

## 2019-03-02 DIAGNOSIS — E113511 Type 2 diabetes mellitus with proliferative diabetic retinopathy with macular edema, right eye: Secondary | ICD-10-CM | POA: Diagnosis not present

## 2019-04-05 DIAGNOSIS — E1169 Type 2 diabetes mellitus with other specified complication: Secondary | ICD-10-CM | POA: Diagnosis not present

## 2019-04-05 DIAGNOSIS — E785 Hyperlipidemia, unspecified: Secondary | ICD-10-CM | POA: Diagnosis not present

## 2019-04-05 DIAGNOSIS — E113511 Type 2 diabetes mellitus with proliferative diabetic retinopathy with macular edema, right eye: Secondary | ICD-10-CM | POA: Diagnosis not present

## 2019-04-05 DIAGNOSIS — E113512 Type 2 diabetes mellitus with proliferative diabetic retinopathy with macular edema, left eye: Secondary | ICD-10-CM | POA: Diagnosis not present

## 2019-04-05 DIAGNOSIS — I10 Essential (primary) hypertension: Secondary | ICD-10-CM | POA: Diagnosis not present

## 2019-04-05 DIAGNOSIS — E113513 Type 2 diabetes mellitus with proliferative diabetic retinopathy with macular edema, bilateral: Secondary | ICD-10-CM | POA: Diagnosis not present

## 2019-04-05 DIAGNOSIS — E669 Obesity, unspecified: Secondary | ICD-10-CM | POA: Diagnosis not present

## 2019-04-24 ENCOUNTER — Other Ambulatory Visit: Payer: Self-pay | Admitting: Podiatry

## 2019-04-24 ENCOUNTER — Other Ambulatory Visit: Payer: Self-pay

## 2019-04-24 ENCOUNTER — Ambulatory Visit (INDEPENDENT_AMBULATORY_CARE_PROVIDER_SITE_OTHER): Payer: Medicare Other | Admitting: Podiatry

## 2019-04-24 ENCOUNTER — Ambulatory Visit (INDEPENDENT_AMBULATORY_CARE_PROVIDER_SITE_OTHER): Payer: Medicare Other

## 2019-04-24 DIAGNOSIS — B351 Tinea unguium: Secondary | ICD-10-CM | POA: Diagnosis not present

## 2019-04-24 DIAGNOSIS — L603 Nail dystrophy: Secondary | ICD-10-CM

## 2019-04-24 DIAGNOSIS — M79671 Pain in right foot: Secondary | ICD-10-CM

## 2019-04-24 DIAGNOSIS — M19071 Primary osteoarthritis, right ankle and foot: Secondary | ICD-10-CM | POA: Diagnosis not present

## 2019-04-24 MED ORDER — CICLOPIROX 8 % EX SOLN: Freq: Every day | CUTANEOUS | 0 refills | Status: DC

## 2019-05-07 ENCOUNTER — Other Ambulatory Visit: Payer: Self-pay | Admitting: Podiatry

## 2019-05-07 DIAGNOSIS — M19071 Primary osteoarthritis, right ankle and foot: Secondary | ICD-10-CM

## 2019-05-20 NOTE — Progress Notes (Signed)
  Subjective:  Patient ID: Lawrence [name], male    DOB: 04/17/1966,  MRN: [id number]  Chief Complaint  Patient presents with  . Nail Problem    Rt hallux toenail thick and discolored (black/green) x years; no pain at toenial -pt dneis redness/swelling at toe Tx: none -pt also c/o Rt hallux stiffness but no pian     53 y.o. male presents with the above complaint.  History above confirmed with patient   Review of Systems: Negative except as noted in the HPI. Denies N/V/F/Ch.  Past Medical History:  Diagnosis Date  . Diabetes mellitus without complication (HCC)     Current Outpatient Medications:  .  acetaminophen (TYLENOL) 500 MG tablet, Take 2 tablets (1,000 mg total) by mouth every 6 (six) hours., Disp: 90 tablet, Rfl: 0 .  blood glucose meter kit and supplies KIT, Dispense based on patient and insurance preference. Use up to four times daily as directed. (FOR ICD-9 250.00, 250.01)., Disp: 1 each, Rfl: 0 .  calcium citrate (CALCITRATE - DOSED IN MG ELEMENTAL CALCIUM) 950 MG tablet, Take 1 tablet (200 mg of elemental calcium total) by mouth 2 (two) times daily., Disp: 60 tablet, Rfl: 2 .  ciclopirox (PENLAC) 8 % solution, Apply topically at bedtime. Apply over nail and surrounding skin. Apply daily over previous coat. Remove weekly with file or polish remover., Disp: 6.6 mL, Rfl: 0 .  docusate sodium (COLACE) 100 MG capsule, Take 1 capsule (100 mg total) by mouth 2 (two) times daily., Disp: 20 capsule, Rfl: 0 .  insulin glargine (LANTUS) 100 UNIT/ML injection, Inject 0.18 mLs (18 Units total) into the skin at bedtime., Disp: 10 mL, Rfl: 11 .  linaclotide (LINZESS) 145 MCG CAPS capsule, Take 1 capsule (145 mcg total) by mouth daily before breakfast., Disp: 30 capsule, Rfl: 6 .  metFORMIN (GLUCOPHAGE) 500 MG tablet, Take 500 mg by mouth. 2 bid, Disp: , Rfl:   Current Facility-Administered Medications:  .  0.9 %  sodium chloride infusion, 500 mL, Intravenous, Once, Milus Banister,  MD  Social History   Tobacco Use  Smoking Status Never Smoker  Smokeless Tobacco Never Used    No Known Allergies Objective:  There were no vitals filed for this visit. There is no height or weight on file to calculate BMI. Constitutional Well developed. Well nourished.  Vascular Dorsalis pedis pulses palpable bilaterally. Posterior tibial pulses palpable bilaterally. Capillary refill normal to all digits.  No cyanosis or clubbing noted. Pedal hair growth normal.  Neurologic Normal speech. Oriented to person, place, and time. Epicritic sensation to light touch grossly present bilaterally.  Dermatologic Nails right hallux thickened dystrophic No open wounds. No skin lesions.  Orthopedic: Normal joint ROM without pain or crepitus bilaterally. No visible deformities. No bony tenderness.   Radiographs: none Assessment:   1. Onychomycosis   2. Nail dystrophy    Plan:  Patient was evaluated and treated and all questions answered.  Onychomycosis -Educated on etiology of nail fungus. -Rx Penlac  No follow-ups on file.

## 2019-06-26 ENCOUNTER — Ambulatory Visit (INDEPENDENT_AMBULATORY_CARE_PROVIDER_SITE_OTHER): Payer: PPO | Admitting: Podiatry

## 2019-06-26 DIAGNOSIS — Z5329 Procedure and treatment not carried out because of patient's decision for other reasons: Secondary | ICD-10-CM

## 2019-06-26 NOTE — Progress Notes (Signed)
No show for appt. 

## 2019-07-11 DIAGNOSIS — E113512 Type 2 diabetes mellitus with proliferative diabetic retinopathy with macular edema, left eye: Secondary | ICD-10-CM | POA: Diagnosis not present

## 2019-09-19 DIAGNOSIS — E113553 Type 2 diabetes mellitus with stable proliferative diabetic retinopathy, bilateral: Secondary | ICD-10-CM | POA: Diagnosis not present

## 2019-09-19 DIAGNOSIS — E113511 Type 2 diabetes mellitus with proliferative diabetic retinopathy with macular edema, right eye: Secondary | ICD-10-CM | POA: Diagnosis not present

## 2019-09-19 DIAGNOSIS — E113512 Type 2 diabetes mellitus with proliferative diabetic retinopathy with macular edema, left eye: Secondary | ICD-10-CM | POA: Diagnosis not present

## 2019-09-27 DIAGNOSIS — E785 Hyperlipidemia, unspecified: Secondary | ICD-10-CM | POA: Diagnosis not present

## 2019-09-27 DIAGNOSIS — E669 Obesity, unspecified: Secondary | ICD-10-CM | POA: Diagnosis not present

## 2019-09-27 DIAGNOSIS — R21 Rash and other nonspecific skin eruption: Secondary | ICD-10-CM | POA: Diagnosis not present

## 2019-09-27 DIAGNOSIS — E1169 Type 2 diabetes mellitus with other specified complication: Secondary | ICD-10-CM | POA: Diagnosis not present

## 2019-09-27 DIAGNOSIS — I10 Essential (primary) hypertension: Secondary | ICD-10-CM | POA: Diagnosis not present

## 2019-09-27 DIAGNOSIS — H3500 Unspecified background retinopathy: Secondary | ICD-10-CM | POA: Diagnosis not present

## 2019-09-27 DIAGNOSIS — Z6829 Body mass index (BMI) 29.0-29.9, adult: Secondary | ICD-10-CM | POA: Diagnosis not present

## 2019-09-27 DIAGNOSIS — E11319 Type 2 diabetes mellitus with unspecified diabetic retinopathy without macular edema: Secondary | ICD-10-CM | POA: Diagnosis not present

## 2019-12-12 DIAGNOSIS — E113512 Type 2 diabetes mellitus with proliferative diabetic retinopathy with macular edema, left eye: Secondary | ICD-10-CM | POA: Diagnosis not present

## 2019-12-12 DIAGNOSIS — Z01818 Encounter for other preprocedural examination: Secondary | ICD-10-CM | POA: Diagnosis not present

## 2019-12-12 DIAGNOSIS — E119 Type 2 diabetes mellitus without complications: Secondary | ICD-10-CM | POA: Diagnosis not present

## 2019-12-12 DIAGNOSIS — H4312 Vitreous hemorrhage, left eye: Secondary | ICD-10-CM | POA: Diagnosis not present

## 2019-12-18 DIAGNOSIS — H4312 Vitreous hemorrhage, left eye: Secondary | ICD-10-CM | POA: Diagnosis not present

## 2020-01-07 DIAGNOSIS — H3500 Unspecified background retinopathy: Secondary | ICD-10-CM | POA: Diagnosis not present

## 2020-01-07 DIAGNOSIS — Z9114 Patient's other noncompliance with medication regimen: Secondary | ICD-10-CM | POA: Diagnosis not present

## 2020-01-07 DIAGNOSIS — Z1331 Encounter for screening for depression: Secondary | ICD-10-CM | POA: Diagnosis not present

## 2020-01-07 DIAGNOSIS — E785 Hyperlipidemia, unspecified: Secondary | ICD-10-CM | POA: Diagnosis not present

## 2020-01-07 DIAGNOSIS — Z6829 Body mass index (BMI) 29.0-29.9, adult: Secondary | ICD-10-CM | POA: Diagnosis not present

## 2020-01-07 DIAGNOSIS — E11319 Type 2 diabetes mellitus with unspecified diabetic retinopathy without macular edema: Secondary | ICD-10-CM | POA: Diagnosis not present

## 2020-01-07 DIAGNOSIS — E1169 Type 2 diabetes mellitus with other specified complication: Secondary | ICD-10-CM | POA: Diagnosis not present

## 2020-01-07 DIAGNOSIS — I10 Essential (primary) hypertension: Secondary | ICD-10-CM | POA: Diagnosis not present

## 2020-02-15 DIAGNOSIS — E113512 Type 2 diabetes mellitus with proliferative diabetic retinopathy with macular edema, left eye: Secondary | ICD-10-CM | POA: Diagnosis not present

## 2020-02-15 DIAGNOSIS — H4312 Vitreous hemorrhage, left eye: Secondary | ICD-10-CM | POA: Diagnosis not present

## 2020-03-14 DIAGNOSIS — H4312 Vitreous hemorrhage, left eye: Secondary | ICD-10-CM | POA: Diagnosis not present

## 2020-03-14 DIAGNOSIS — E113512 Type 2 diabetes mellitus with proliferative diabetic retinopathy with macular edema, left eye: Secondary | ICD-10-CM | POA: Diagnosis not present

## 2020-04-18 DIAGNOSIS — I10 Essential (primary) hypertension: Secondary | ICD-10-CM | POA: Diagnosis not present

## 2020-04-18 DIAGNOSIS — E785 Hyperlipidemia, unspecified: Secondary | ICD-10-CM | POA: Diagnosis not present

## 2020-04-18 DIAGNOSIS — E1169 Type 2 diabetes mellitus with other specified complication: Secondary | ICD-10-CM | POA: Diagnosis not present

## 2020-04-18 DIAGNOSIS — H3500 Unspecified background retinopathy: Secondary | ICD-10-CM | POA: Diagnosis not present

## 2020-04-18 DIAGNOSIS — E11319 Type 2 diabetes mellitus with unspecified diabetic retinopathy without macular edema: Secondary | ICD-10-CM | POA: Diagnosis not present

## 2020-04-18 DIAGNOSIS — Z6829 Body mass index (BMI) 29.0-29.9, adult: Secondary | ICD-10-CM | POA: Diagnosis not present

## 2020-04-18 DIAGNOSIS — Z9114 Patient's other noncompliance with medication regimen: Secondary | ICD-10-CM | POA: Diagnosis not present

## 2020-07-22 DIAGNOSIS — I10 Essential (primary) hypertension: Secondary | ICD-10-CM | POA: Diagnosis not present

## 2020-07-22 DIAGNOSIS — Z6828 Body mass index (BMI) 28.0-28.9, adult: Secondary | ICD-10-CM | POA: Diagnosis not present

## 2020-07-22 DIAGNOSIS — M79674 Pain in right toe(s): Secondary | ICD-10-CM | POA: Diagnosis not present

## 2020-07-22 DIAGNOSIS — H3500 Unspecified background retinopathy: Secondary | ICD-10-CM | POA: Diagnosis not present

## 2020-07-22 DIAGNOSIS — E11319 Type 2 diabetes mellitus with unspecified diabetic retinopathy without macular edema: Secondary | ICD-10-CM | POA: Diagnosis not present

## 2020-07-22 DIAGNOSIS — E785 Hyperlipidemia, unspecified: Secondary | ICD-10-CM | POA: Diagnosis not present

## 2020-07-22 DIAGNOSIS — E1169 Type 2 diabetes mellitus with other specified complication: Secondary | ICD-10-CM | POA: Diagnosis not present

## 2020-07-22 DIAGNOSIS — Z9114 Patient's other noncompliance with medication regimen: Secondary | ICD-10-CM | POA: Diagnosis not present

## 2020-07-30 DIAGNOSIS — E113512 Type 2 diabetes mellitus with proliferative diabetic retinopathy with macular edema, left eye: Secondary | ICD-10-CM | POA: Diagnosis not present

## 2020-07-30 DIAGNOSIS — E113511 Type 2 diabetes mellitus with proliferative diabetic retinopathy with macular edema, right eye: Secondary | ICD-10-CM | POA: Diagnosis not present

## 2020-07-30 DIAGNOSIS — E113513 Type 2 diabetes mellitus with proliferative diabetic retinopathy with macular edema, bilateral: Secondary | ICD-10-CM | POA: Diagnosis not present

## 2020-09-01 ENCOUNTER — Other Ambulatory Visit: Payer: Self-pay | Admitting: Podiatry

## 2020-09-01 ENCOUNTER — Ambulatory Visit (INDEPENDENT_AMBULATORY_CARE_PROVIDER_SITE_OTHER): Payer: PPO | Admitting: Podiatry

## 2020-09-01 DIAGNOSIS — Z5329 Procedure and treatment not carried out because of patient's decision for other reasons: Secondary | ICD-10-CM

## 2020-09-01 DIAGNOSIS — M79671 Pain in right foot: Secondary | ICD-10-CM

## 2020-09-01 NOTE — Progress Notes (Signed)
No show for appointment, 2nd in a row.

## 2020-09-04 ENCOUNTER — Ambulatory Visit (INDEPENDENT_AMBULATORY_CARE_PROVIDER_SITE_OTHER): Payer: PPO | Admitting: Podiatry

## 2020-09-04 ENCOUNTER — Ambulatory Visit: Payer: PPO | Admitting: Podiatry

## 2020-09-04 DIAGNOSIS — Z5329 Procedure and treatment not carried out because of patient's decision for other reasons: Secondary | ICD-10-CM

## 2020-09-04 NOTE — Progress Notes (Signed)
No show for appt. 3rd No-Show in a row.

## 2020-09-10 ENCOUNTER — Ambulatory Visit (INDEPENDENT_AMBULATORY_CARE_PROVIDER_SITE_OTHER): Payer: PPO

## 2020-09-10 ENCOUNTER — Encounter: Payer: Self-pay | Admitting: Sports Medicine

## 2020-09-10 ENCOUNTER — Ambulatory Visit: Payer: PPO | Admitting: Sports Medicine

## 2020-09-10 ENCOUNTER — Other Ambulatory Visit: Payer: Self-pay

## 2020-09-10 DIAGNOSIS — M19071 Primary osteoarthritis, right ankle and foot: Secondary | ICD-10-CM | POA: Diagnosis not present

## 2020-09-10 DIAGNOSIS — M79671 Pain in right foot: Secondary | ICD-10-CM

## 2020-09-10 DIAGNOSIS — E1142 Type 2 diabetes mellitus with diabetic polyneuropathy: Secondary | ICD-10-CM

## 2020-09-10 DIAGNOSIS — M19079 Primary osteoarthritis, unspecified ankle and foot: Secondary | ICD-10-CM

## 2020-09-10 DIAGNOSIS — M2170 Unequal limb length (acquired), unspecified site: Secondary | ICD-10-CM | POA: Diagnosis not present
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
## 2020-09-15 ENCOUNTER — Other Ambulatory Visit: Payer: Self-pay | Admitting: Sports Medicine

## 2020-09-15 DIAGNOSIS — M19079 Primary osteoarthritis, unspecified ankle and foot: Secondary | ICD-10-CM

## 2020-09-17 DIAGNOSIS — E113511 Type 2 diabetes mellitus with proliferative diabetic retinopathy with macular edema, right eye: Secondary | ICD-10-CM | POA: Diagnosis not present

## 2020-09-17 DIAGNOSIS — E113512 Type 2 diabetes mellitus with proliferative diabetic retinopathy with macular edema, left eye: Secondary | ICD-10-CM | POA: Diagnosis not present

## 2020-09-17 DIAGNOSIS — H35371 Puckering of macula, right eye: Secondary | ICD-10-CM | POA: Diagnosis not present

## 2020-10-03 ENCOUNTER — Other Ambulatory Visit: Payer: PPO

## 2020-10-21 DIAGNOSIS — Z6829 Body mass index (BMI) 29.0-29.9, adult: Secondary | ICD-10-CM | POA: Diagnosis not present

## 2020-10-21 DIAGNOSIS — H3500 Unspecified background retinopathy: Secondary | ICD-10-CM | POA: Diagnosis not present

## 2020-10-21 DIAGNOSIS — Z9114 Patient's other noncompliance with medication regimen: Secondary | ICD-10-CM | POA: Diagnosis not present

## 2020-10-21 DIAGNOSIS — E11319 Type 2 diabetes mellitus with unspecified diabetic retinopathy without macular edema: Secondary | ICD-10-CM | POA: Diagnosis not present

## 2020-10-21 DIAGNOSIS — I10 Essential (primary) hypertension: Secondary | ICD-10-CM | POA: Diagnosis not present

## 2020-10-21 DIAGNOSIS — E785 Hyperlipidemia, unspecified: Secondary | ICD-10-CM | POA: Diagnosis not present

## 2020-10-21 DIAGNOSIS — E1169 Type 2 diabetes mellitus with other specified complication: Secondary | ICD-10-CM | POA: Diagnosis not present

## 2021-01-30 DIAGNOSIS — Z6829 Body mass index (BMI) 29.0-29.9, adult: Secondary | ICD-10-CM | POA: Diagnosis not present

## 2021-01-30 DIAGNOSIS — Z9114 Patient's other noncompliance with medication regimen: Secondary | ICD-10-CM | POA: Diagnosis not present

## 2021-01-30 DIAGNOSIS — I10 Essential (primary) hypertension: Secondary | ICD-10-CM | POA: Diagnosis not present

## 2021-01-30 DIAGNOSIS — E11319 Type 2 diabetes mellitus with unspecified diabetic retinopathy without macular edema: Secondary | ICD-10-CM | POA: Diagnosis not present

## 2021-01-30 DIAGNOSIS — H3500 Unspecified background retinopathy: Secondary | ICD-10-CM | POA: Diagnosis not present

## 2021-01-30 DIAGNOSIS — E785 Hyperlipidemia, unspecified: Secondary | ICD-10-CM | POA: Diagnosis not present

## 2021-01-30 DIAGNOSIS — E1169 Type 2 diabetes mellitus with other specified complication: Secondary | ICD-10-CM | POA: Diagnosis not present

## 2021-02-12 ENCOUNTER — Encounter (HOSPITAL_COMMUNITY): Payer: Self-pay | Admitting: Cardiovascular Disease

## 2021-02-12 ENCOUNTER — Emergency Department (HOSPITAL_COMMUNITY): Payer: PPO

## 2021-02-12 ENCOUNTER — Other Ambulatory Visit: Payer: Self-pay

## 2021-02-12 ENCOUNTER — Inpatient Hospital Stay (HOSPITAL_COMMUNITY)
Admission: EM | Admit: 2021-02-12 | Discharge: 2021-02-17 | DRG: 247 | Disposition: A | Discharge status: Home / Self Care | Payer: PPO | Attending: Cardiovascular Disease | Admitting: Cardiovascular Disease

## 2021-02-12 ENCOUNTER — Inpatient Hospital Stay (HOSPITAL_COMMUNITY)
Admission: EM | Disposition: A | Discharge status: Home / Self Care | Payer: Self-pay | Source: Home / Self Care | Attending: Cardiovascular Disease

## 2021-02-12 DIAGNOSIS — Z955 Presence of coronary angioplasty implant and graft: Secondary | ICD-10-CM

## 2021-02-12 DIAGNOSIS — I10 Essential (primary) hypertension: Secondary | ICD-10-CM | POA: Diagnosis present

## 2021-02-12 DIAGNOSIS — I509 Heart failure, unspecified: Secondary | ICD-10-CM | POA: Diagnosis not present

## 2021-02-12 DIAGNOSIS — E782 Mixed hyperlipidemia: Secondary | ICD-10-CM | POA: Diagnosis not present

## 2021-02-12 DIAGNOSIS — Z79899 Other long term (current) drug therapy: Secondary | ICD-10-CM

## 2021-02-12 DIAGNOSIS — Z794 Long term (current) use of insulin: Secondary | ICD-10-CM | POA: Diagnosis not present

## 2021-02-12 DIAGNOSIS — D649 Anemia, unspecified: Secondary | ICD-10-CM | POA: Diagnosis present

## 2021-02-12 DIAGNOSIS — G8929 Other chronic pain: Secondary | ICD-10-CM | POA: Diagnosis not present

## 2021-02-12 DIAGNOSIS — E785 Hyperlipidemia, unspecified: Secondary | ICD-10-CM

## 2021-02-12 DIAGNOSIS — E7800 Pure hypercholesterolemia, unspecified: Secondary | ICD-10-CM | POA: Diagnosis present

## 2021-02-12 DIAGNOSIS — I420 Dilated cardiomyopathy: Secondary | ICD-10-CM | POA: Diagnosis not present

## 2021-02-12 DIAGNOSIS — I214 Non-ST elevation (NSTEMI) myocardial infarction: Secondary | ICD-10-CM | POA: Diagnosis not present

## 2021-02-12 DIAGNOSIS — I2510 Atherosclerotic heart disease of native coronary artery without angina pectoris: Secondary | ICD-10-CM

## 2021-02-12 DIAGNOSIS — I252 Old myocardial infarction: Secondary | ICD-10-CM

## 2021-02-12 DIAGNOSIS — I501 Left ventricular failure: Secondary | ICD-10-CM | POA: Diagnosis not present

## 2021-02-12 DIAGNOSIS — Z7984 Long term (current) use of oral hypoglycemic drugs: Secondary | ICD-10-CM | POA: Diagnosis not present

## 2021-02-12 DIAGNOSIS — I313 Pericardial effusion (noninflammatory): Secondary | ICD-10-CM | POA: Diagnosis not present

## 2021-02-12 DIAGNOSIS — Z20822 Contact with and (suspected) exposure to covid-19: Secondary | ICD-10-CM | POA: Diagnosis present

## 2021-02-12 DIAGNOSIS — I255 Ischemic cardiomyopathy: Secondary | ICD-10-CM | POA: Diagnosis not present

## 2021-02-12 DIAGNOSIS — Z809 Family history of malignant neoplasm, unspecified: Secondary | ICD-10-CM

## 2021-02-12 DIAGNOSIS — E119 Type 2 diabetes mellitus without complications: Secondary | ICD-10-CM

## 2021-02-12 DIAGNOSIS — Z20828 Contact with and (suspected) exposure to other viral communicable diseases: Secondary | ICD-10-CM | POA: Diagnosis not present

## 2021-02-12 DIAGNOSIS — I2102 ST elevation (STEMI) myocardial infarction involving left anterior descending coronary artery: Secondary | ICD-10-CM | POA: Diagnosis not present

## 2021-02-12 DIAGNOSIS — R0789 Other chest pain: Secondary | ICD-10-CM | POA: Diagnosis not present

## 2021-02-12 DIAGNOSIS — I213 ST elevation (STEMI) myocardial infarction of unspecified site: Secondary | ICD-10-CM

## 2021-02-12 DIAGNOSIS — R079 Chest pain, unspecified: Secondary | ICD-10-CM | POA: Diagnosis not present

## 2021-02-12 HISTORY — DX: Essential (primary) hypertension: I10

## 2021-02-12 HISTORY — PX: CORONARY/GRAFT ACUTE MI REVASCULARIZATION: CATH118305

## 2021-02-12 HISTORY — PX: LEFT HEART CATH AND CORONARY ANGIOGRAPHY: CATH118249

## 2021-02-12 HISTORY — DX: Hyperlipidemia, unspecified: E78.5

## 2021-02-12 HISTORY — PX: CORONARY STENT INTERVENTION: CATH118234

## 2021-02-12 LAB — CBC
HCT: 34.7 % — ABNORMAL LOW (ref 39.0–52.0)
Hemoglobin: 11.2 g/dL — ABNORMAL LOW (ref 13.0–17.0)
MCH: 29.8 pg (ref 26.0–34.0)
MCHC: 32.3 g/dL (ref 30.0–36.0)
MCV: 92.3 fL (ref 80.0–100.0)
Platelets: 165 10*3/uL (ref 150–400)
RBC: 3.76 MIL/uL — ABNORMAL LOW (ref 4.22–5.81)
RDW: 13 % (ref 11.5–15.5)
WBC: 11.4 10*3/uL — ABNORMAL HIGH (ref 4.0–10.5)
nRBC: 0 % (ref 0.0–0.2)

## 2021-02-12 LAB — RESP PANEL BY RT-PCR (FLU A&B, COVID) ARPGX2
Influenza A by PCR: NEGATIVE
Influenza B by PCR: NEGATIVE
SARS Coronavirus 2 by RT PCR: NEGATIVE

## 2021-02-12 LAB — GLUCOSE, CAPILLARY: Glucose-Capillary: 124 mg/dL — ABNORMAL HIGH (ref 70–99)

## 2021-02-12 LAB — HEMOGLOBIN A1C
Hgb A1c MFr Bld: 7.7 % — ABNORMAL HIGH (ref 4.8–5.6)
Mean Plasma Glucose: 174.29 mg/dL

## 2021-02-12 LAB — APTT: aPTT: 33 seconds (ref 24–36)

## 2021-02-12 LAB — LIPID PANEL
Cholesterol: 221 mg/dL — ABNORMAL HIGH (ref 0–200)
HDL: 29 mg/dL — ABNORMAL LOW (ref >40)
LDL Cholesterol: 168 mg/dL — ABNORMAL HIGH (ref 0–99)
Total CHOL/HDL Ratio: 7.6 RATIO
Triglycerides: 121 mg/dL (ref <150)
VLDL: 24 mg/dL (ref 0–40)

## 2021-02-12 LAB — BASIC METABOLIC PANEL
Anion gap: 8 (ref 5–15)
BUN: 18 mg/dL (ref 6–20)
CO2: 24 mmol/L (ref 22–32)
Calcium: 9 mg/dL (ref 8.9–10.3)
Chloride: 102 mmol/L (ref 98–111)
Creatinine, Ser: 0.96 mg/dL (ref 0.61–1.24)
GFR, Estimated: >60 mL/min (ref >60)
Glucose, Bld: 171 mg/dL — ABNORMAL HIGH (ref 70–99)
Potassium: 5 mmol/L (ref 3.5–5.1)
Sodium: 134 mmol/L — ABNORMAL LOW (ref 135–145)

## 2021-02-12 LAB — PROTIME-INR
INR: 1.1 (ref 0.8–1.2)
Prothrombin Time: 14.2 seconds (ref 11.4–15.2)

## 2021-02-12 LAB — TROPONIN I (HIGH SENSITIVITY)
Troponin I (High Sensitivity): 5060 ng/L (ref <18)
Troponin I (High Sensitivity): 8363 ng/L (ref <18)

## 2021-02-12 LAB — POCT ACTIVATED CLOTTING TIME: Activated Clotting Time: 353 seconds

## 2021-02-12 SURGERY — CORONARY/GRAFT ACUTE MI REVASCULARIZATION
Anesthesia: LOCAL

## 2021-02-12 MED ORDER — SODIUM CHLORIDE 0.9% FLUSH: 3.0000 mL | INTRAVENOUS | Status: DC | PRN

## 2021-02-12 MED ORDER — IOHEXOL 350 MG/ML SOLN
INTRAVENOUS | Status: DC | PRN
  Administered 2021-02-12: 135 mL via INTRA_ARTERIAL

## 2021-02-12 MED ORDER — DOCUSATE SODIUM 100 MG PO CAPS
100.0000 mg | ORAL_CAPSULE | Freq: Two times a day (BID) | ORAL | Status: DC
  Administered 2021-02-13 – 2021-02-17 (×6): 100 mg via ORAL
  Filled 2021-02-12 (×6): qty 1

## 2021-02-12 MED ORDER — TIROFIBAN HCL IN NACL 5-0.9 MG/100ML-% IV SOLN
INTRAVENOUS | Status: AC | PRN
  Administered 2021-02-12: .15 ug/kg/min via INTRAVENOUS

## 2021-02-12 MED ORDER — ACETAMINOPHEN 325 MG PO TABS
650.0000 mg | ORAL_TABLET | ORAL | Status: DC | PRN
  Administered 2021-02-12 – 2021-02-13 (×2): 650 mg via ORAL
  Filled 2021-02-12 (×2): qty 2

## 2021-02-12 MED ORDER — FENTANYL CITRATE (PF) 100 MCG/2ML IJ SOLN
INTRAMUSCULAR | Status: DC | PRN
  Administered 2021-02-12: 25 ug via INTRAVENOUS

## 2021-02-12 MED ORDER — FENTANYL CITRATE (PF) 100 MCG/2ML IJ SOLN
INTRAMUSCULAR | Status: AC
  Filled 2021-02-12: qty 2

## 2021-02-12 MED ORDER — HEPARIN (PORCINE) IN NACL 1000-0.9 UT/500ML-% IV SOLN
INTRAVENOUS | Status: AC
  Filled 2021-02-12: qty 1000

## 2021-02-12 MED ORDER — MIDAZOLAM HCL 2 MG/2ML IJ SOLN
INTRAMUSCULAR | Status: DC | PRN
  Administered 2021-02-12: 1 mg via INTRAVENOUS

## 2021-02-12 MED ORDER — LIDOCAINE HCL (PF) 1 % IJ SOLN
INTRAMUSCULAR | Status: DC | PRN
  Administered 2021-02-12: 2 mL via INTRADERMAL

## 2021-02-12 MED ORDER — LABETALOL HCL 5 MG/ML IV SOLN: 10.0000 mg | INTRAVENOUS | Status: AC | PRN

## 2021-02-12 MED ORDER — HYDRALAZINE HCL 20 MG/ML IJ SOLN: 10.0000 mg | INTRAMUSCULAR | Status: AC | PRN

## 2021-02-12 MED ORDER — NITROGLYCERIN 0.4 MG SL SUBL
0.4000 mg | SUBLINGUAL_TABLET | SUBLINGUAL | Status: DC | PRN
  Administered 2021-02-12: 0.4 mg via SUBLINGUAL
  Filled 2021-02-12: qty 1

## 2021-02-12 MED ORDER — VERAPAMIL HCL 2.5 MG/ML IV SOLN
INTRAVENOUS | Status: DC | PRN
  Administered 2021-02-12: 10 mL via INTRA_ARTERIAL

## 2021-02-12 MED ORDER — PNEUMOCOCCAL VAC POLYVALENT 25 MCG/0.5ML IJ INJ
0.5000 mL | INJECTION | INTRAMUSCULAR | Status: DC
  Filled 2021-02-12: qty 0.5

## 2021-02-12 MED ORDER — VERAPAMIL HCL 2.5 MG/ML IV SOLN
INTRAVENOUS | Status: AC
  Filled 2021-02-12: qty 2

## 2021-02-12 MED ORDER — ASPIRIN 81 MG PO CHEW
81.0000 mg | CHEWABLE_TABLET | Freq: Every day | ORAL | Status: DC
  Administered 2021-02-13 – 2021-02-17 (×5): 81 mg via ORAL
  Filled 2021-02-12 (×5): qty 1

## 2021-02-12 MED ORDER — ROSUVASTATIN CALCIUM 40 MG PO TABS
40.0000 mg | ORAL_TABLET | Freq: Every day | ORAL | Status: DC
  Filled 2021-02-12: qty 1

## 2021-02-12 MED ORDER — HEPARIN (PORCINE) IN NACL 1000-0.9 UT/500ML-% IV SOLN
INTRAVENOUS | Status: DC | PRN
  Administered 2021-02-12 (×2): 500 mL

## 2021-02-12 MED ORDER — TICAGRELOR 90 MG PO TABS
ORAL_TABLET | ORAL | Status: AC
  Filled 2021-02-12: qty 1

## 2021-02-12 MED ORDER — HEPARIN SODIUM (PORCINE) 1000 UNIT/ML IJ SOLN
INTRAMUSCULAR | Status: AC
  Filled 2021-02-12: qty 1

## 2021-02-12 MED ORDER — TIROFIBAN HCL IN NACL 5-0.9 MG/100ML-% IV SOLN
INTRAVENOUS | Status: AC
  Filled 2021-02-12: qty 100

## 2021-02-12 MED ORDER — ONDANSETRON HCL 4 MG/2ML IJ SOLN: 4.0000 mg | Freq: Four times a day (QID) | INTRAMUSCULAR | Status: DC | PRN

## 2021-02-12 MED ORDER — NITROGLYCERIN 1 MG/10 ML FOR IR/CATH LAB
INTRA_ARTERIAL | Status: AC
  Filled 2021-02-12: qty 10

## 2021-02-12 MED ORDER — TICAGRELOR 90 MG PO TABS
90.0000 mg | ORAL_TABLET | Freq: Two times a day (BID) | ORAL | Status: DC
  Administered 2021-02-13 – 2021-02-17 (×9): 90 mg via ORAL
  Filled 2021-02-12 (×9): qty 1

## 2021-02-12 MED ORDER — SODIUM CHLORIDE 0.9% FLUSH
3.0000 mL | Freq: Two times a day (BID) | INTRAVENOUS | Status: DC
  Administered 2021-02-13 – 2021-02-17 (×8): 3 mL via INTRAVENOUS

## 2021-02-12 MED ORDER — TICAGRELOR 90 MG PO TABS
ORAL_TABLET | ORAL | Status: DC | PRN
  Administered 2021-02-12: 180 mg via ORAL

## 2021-02-12 MED ORDER — SODIUM CHLORIDE 0.9 % IV SOLN: INTRAVENOUS | Status: DC

## 2021-02-12 MED ORDER — LIDOCAINE HCL (PF) 1 % IJ SOLN
INTRAMUSCULAR | Status: AC
  Filled 2021-02-12: qty 30

## 2021-02-12 MED ORDER — HEPARIN SODIUM (PORCINE) 1000 UNIT/ML IJ SOLN
INTRAMUSCULAR | Status: DC | PRN
  Administered 2021-02-12: 10000 [IU] via INTRAVENOUS

## 2021-02-12 MED ORDER — INSULIN ASPART 100 UNIT/ML IJ SOLN
0.0000 [IU] | Freq: Three times a day (TID) | INTRAMUSCULAR | Status: DC
  Administered 2021-02-13 (×2): 2 [IU] via SUBCUTANEOUS
  Administered 2021-02-14: 3 [IU] via SUBCUTANEOUS
  Administered 2021-02-14: 2 [IU] via SUBCUTANEOUS
  Administered 2021-02-15: 3 [IU] via SUBCUTANEOUS
  Administered 2021-02-15: 2 [IU] via SUBCUTANEOUS
  Administered 2021-02-16 – 2021-02-17 (×3): 3 [IU] via SUBCUTANEOUS

## 2021-02-12 MED ORDER — TIROFIBAN (AGGRASTAT) BOLUS VIA INFUSION
INTRAVENOUS | Status: DC | PRN
  Administered 2021-02-12: 2212.5 ug via INTRAVENOUS

## 2021-02-12 MED ORDER — SODIUM CHLORIDE 0.9 % IV SOLN: 250.0000 mL | INTRAVENOUS | Status: DC | PRN

## 2021-02-12 MED ORDER — ROSUVASTATIN CALCIUM 20 MG PO TABS
40.0000 mg | ORAL_TABLET | Freq: Every day | ORAL | Status: DC
  Administered 2021-02-12 – 2021-02-17 (×6): 40 mg via ORAL
  Filled 2021-02-12 (×6): qty 2

## 2021-02-12 MED ORDER — SODIUM CHLORIDE 0.9 % IV SOLN: INTRAVENOUS | Status: AC

## 2021-02-12 MED ORDER — INSULIN GLARGINE-YFGN 100 UNIT/ML ~~LOC~~ SOLN
18.0000 [IU] | Freq: Every day | SUBCUTANEOUS | Status: DC
  Administered 2021-02-12 – 2021-02-16 (×4): 18 [IU] via SUBCUTANEOUS
  Filled 2021-02-12 (×6): qty 0.18

## 2021-02-12 MED ORDER — INFLUENZA VAC SPLIT QUAD 0.5 ML IM SUSY
0.5000 mL | PREFILLED_SYRINGE | INTRAMUSCULAR | Status: DC
  Filled 2021-02-12: qty 0.5

## 2021-02-12 MED ORDER — HEPARIN SODIUM (PORCINE) 5000 UNIT/ML IJ SOLN
4000.0000 [IU] | Freq: Once | INTRAMUSCULAR | Status: AC
  Administered 2021-02-12: 4000 [IU] via INTRAVENOUS
  Filled 2021-02-12: qty 1

## 2021-02-12 MED ORDER — MORPHINE SULFATE (PF) 4 MG/ML IV SOLN
4.0000 mg | Freq: Once | INTRAVENOUS | Status: AC
  Administered 2021-02-12: 4 mg via INTRAVENOUS
  Filled 2021-02-12: qty 1

## 2021-02-12 MED ORDER — ASPIRIN 81 MG PO CHEW
324.0000 mg | CHEWABLE_TABLET | Freq: Once | ORAL | Status: AC
  Administered 2021-02-12: 324 mg via ORAL
  Filled 2021-02-12: qty 4

## 2021-02-12 MED ORDER — TIROFIBAN HCL IN NACL 5-0.9 MG/100ML-% IV SOLN
0.1500 ug/kg/min | INTRAVENOUS | Status: AC
Start: 2021-02-12 — End: 2021-02-12

## 2021-02-12 MED ORDER — METOPROLOL TARTRATE 12.5 MG HALF TABLET
25.0000 mg | ORAL_TABLET | Freq: Two times a day (BID) | ORAL | Status: DC
  Administered 2021-02-12: 25 mg via ORAL
  Filled 2021-02-12: qty 2

## 2021-02-12 MED ORDER — FUROSEMIDE 10 MG/ML IJ SOLN
40.0000 mg | Freq: Once | INTRAMUSCULAR | Status: AC
  Administered 2021-02-12: 40 mg via INTRAVENOUS
  Filled 2021-02-12: qty 4

## 2021-02-12 MED ORDER — MIDAZOLAM HCL 2 MG/2ML IJ SOLN
INTRAMUSCULAR | Status: AC
  Filled 2021-02-12: qty 2

## 2021-02-12 MED ORDER — LINACLOTIDE 145 MCG PO CAPS
145.0000 ug | ORAL_CAPSULE | Freq: Every day | ORAL | Status: DC
  Administered 2021-02-13 – 2021-02-17 (×5): 145 ug via ORAL
  Filled 2021-02-12 (×6): qty 1

## 2021-02-12 SURGICAL SUPPLY — 17 items
BALLN SAPPHIRE 2.0X12 (BALLOONS) ×2
BALLN SAPPHIRE ~~LOC~~ 3.25X12 (BALLOONS) ×2 IMPLANT
BALLOON SAPPHIRE 2.0X12 (BALLOONS) ×1 IMPLANT
CATH 5FR JL3.5 JR4 ANG PIG MP (CATHETERS) ×2 IMPLANT
CATH VISTA GUIDE 6FR XBLAD3.5 (CATHETERS) ×2 IMPLANT
DEVICE RAD COMP TR BAND LRG (VASCULAR PRODUCTS) ×2 IMPLANT
GLIDESHEATH SLEND SS 6F .021 (SHEATH) ×2 IMPLANT
GUIDEWIRE INQWIRE 1.5J.035X260 (WIRE) ×1 IMPLANT
INQWIRE 1.5J .035X260CM (WIRE) ×2
KIT ENCORE 26 ADVANTAGE (KITS) ×2 IMPLANT
KIT HEART LEFT (KITS) ×2 IMPLANT
PACK CARDIAC CATHETERIZATION (CUSTOM PROCEDURE TRAY) ×2 IMPLANT
STENT ONYX FRONTIER 2.25X30 (Permanent Stent) ×2 IMPLANT
STENT ONYX FRONTIER 3.0X18 (Permanent Stent) ×2 IMPLANT
TRANSDUCER W/STOPCOCK (MISCELLANEOUS) ×2 IMPLANT
TUBING CIL FLEX 10 FLL-RA (TUBING) ×2 IMPLANT
WIRE COUGAR XT STRL 190CM (WIRE) ×2 IMPLANT

## 2021-02-12 NOTE — Progress Notes (Signed)
   02/12/21 1838  Clinical Encounter Type  Visited With Patient not available  Visit Type Initial;Code  Referral From Nurse  Consult/Referral To Chaplain   Chaplain responded to Code STEMI. Pt being treated and no support person present. No current need for chaplain. Chaplain remains available.   This note was prepared by Chaplain Resident, Dante Gang, MDiv. Chaplain remains available as needed through the on-call pager: [phone].

## 2021-02-12 NOTE — H&P (Signed)
Cardiology Admission History and Physical:   Patient ID: Lawrence [name] MRN: [id number]; DOB: 05/11/1966   Admission date: 02/12/2021  PCP:  Melony Overly, MD   [facility] Providers Cardiologist:  None   {  Chief Complaint:  chest pain  Patient Profile:   Lawrence [name] is a 55 y.o. male with history of DM, HTN, HLD who is being seen 02/12/2021 for the evaluation of chest pain at the request of Dr. Johnney Killian.   History of Present Illness:   Lawrence [name] is a 55 yo male with history of DM, HTN and hyperlipidemia who began having chest pain last night around 10;30 pm. His pain persisted through the night and felt slightly better this am. The pain recurred later in the morning and he went into the Urgent Care where he was referred to the [facility] ED. He has had persistent chest pain this afternoon. EKG with subtle anterior ST elevation with Q wave formation. Troponin over 5000. Still with chest pain in the ED mildly improved with morphine. Code STEMI called by Dr. Johnney Killian in the ED. Our team evaluated him in the ED and will take him to the cath lab for urgent cardiac catheterization given evidence of troponin elevation and ongoing chest pain. He was given ASA and IV heparin in the ED.    Past Medical History:  Diagnosis Date   Diabetes mellitus without complication ([place])    HTN (hypertension)    Hyperlipidemia     Past Surgical History:  Procedure Laterality Date   EXTERNAL FIXATION LEG Right 02/27/2016   Procedure: EXTERNAL FIXATION LEG;  Surgeon: Netta Cedars, MD;  Location: [place];  Service: Orthopedics;  Laterality: Right;  reduction and external fixation right tibial plateau fracture   EXTERNAL FIXATION REMOVAL Right 03/04/2016   Procedure: REMOVAL EXTERNAL FIXATION LEG;  Surgeon: Altamese [place], MD;  Location: [place];  Service: Orthopedics;  Laterality: Right;   HARVEST BONE GRAFT Left 03/04/2016   Procedure: Rimmed IM aspirate;  Surgeon: Altamese Winter, MD;  Location: [place];  Service:  Orthopedics;  Laterality: Left;   ORIF TIBIA PLATEAU Right 03/04/2016   Procedure: OPEN REDUCTION INTERNAL FIXATION (ORIF) TIBIAL PLATEAU;  Surgeon: Altamese [place], MD;  Location: [place];  Service: Orthopedics;  Laterality: Right;     Medications Prior to Admission: Prior to Admission medications   Medication Sig Start Date End Date Taking? Authorizing Provider  acetaminophen (TYLENOL) 500 MG tablet Take 2 tablets (1,000 mg total) by mouth every 6 (six) hours. 03/08/16   Ainsley Spinner, PA-C  blood glucose meter kit and supplies KIT Dispense based on patient and insurance preference. Use up to four times daily as directed. (FOR ICD-9 250.00, 250.01). 03/08/16   Ainsley Spinner, PA-C  calcium citrate (CALCITRATE - DOSED IN MG ELEMENTAL CALCIUM) 950 MG tablet Take 1 tablet (200 mg of elemental calcium total) by mouth 2 (two) times daily. 03/08/16   Ainsley Spinner, PA-C  ciclopirox [facility]) 8 % solution Apply topically at bedtime. Apply over nail and surrounding skin. Apply daily over previous coat. Remove weekly with file or polish remover. 04/24/19   Evelina Bucy, DPM  docusate sodium (COLACE) 100 MG capsule Take 1 capsule (100 mg total) by mouth 2 (two) times daily. 03/08/16   Ainsley Spinner, PA-C  FREESTYLE PRECISION NEO TEST test strip USE 1 STRIP TO CHECK GLUCOSE ONCE DAILY 07/26/20   [provider]  insulin glargine (LANTUS) 100 UNIT/ML injection Inject 0.18 mLs (18 Units total) into the skin  at bedtime. 03/08/16   Verlee Monte, MD  linaclotide [facility]) 145 MCG CAPS capsule Take 1 capsule (145 mcg total) by mouth daily before breakfast. 11/22/18   Milus Banister, MD  metFORMIN (GLUCOPHAGE) 500 MG tablet Take 500 mg by mouth. 2 bid    [provider]  omega-3 acid ethyl esters (LOVAZA) 1 g capsule Take 2 capsules by mouth 2 (two) times daily. 07/29/20   [provider]  rosuvastatin (CRESTOR) 20 MG tablet Take 20 mg by mouth daily. 07/29/20   [provider]     Allergies:    No Known Allergies  Social History:   Social History   Socioeconomic History   Marital status: Married    Spouse name: Not on file   Number of children: Not on file   Years of education: Not on file   Highest education level: Not on file  Occupational History   Not on file  Tobacco Use   Smoking status: Never   Smokeless tobacco: Never  Substance and Sexual Activity   Alcohol use: Yes   Drug use: No   Sexual activity: Yes  Other Topics Concern   Not on file  Social History Narrative   Not on file   Social Determinants of Health   Financial Resource Strain: Not on file  Food Insecurity: Not on file  Transportation Needs: Not on file  Physical Activity: Not on file  Stress: Not on file  Social Connections: Not on file  Intimate Partner Violence: Not on file    Family History:   The patient's family history includes Cancer in his father and mother.    ROS:  Please see the history of present illness.  All other ROS reviewed and negative.     Physical Exam/Data:   Vitals:   02/12/21 1840 02/12/21 1845 02/12/21 1850 02/12/21 1855  BP: 127/81 121/74 134/90 135/78  Pulse: (!) 111 (!) 108 (!) 111 (!) 113  Resp: (!) 23 18 (!) 22 (!) 27  Temp:      TempSrc:      SpO2: 94% 96% 93% 93%  Weight:      Height:       No intake or output data in the 24 hours ending 02/12/21 1921 Last 3 Weights 02/12/2021 11/22/2018 11/13/2018  Weight (lbs) 195 lb 190 lb 190 lb  Weight (kg) 88.451 kg 86.183 kg 86.183 kg     Body mass index is 30.54 kg/m.  General:  Well nourished, well developed, appears uncomfortable HEENT: normal Lymph: no adenopathy Neck: no JVD Endocrine:  No thryomegaly Vascular: No carotid bruits; FA pulses 2+ bilaterally without bruits  Cardiac:  normal S1, S2; RRR; no murmur  Lungs:  clear to auscultation bilaterally, no wheezing, rhonchi or rales  Abd: soft, nontender, no hepatomegaly  Ext: no LE edema Musculoskeletal:  No deformities, BUE and BLE strength  normal and equal Skin: warm and dry  Neuro:  CNs 2-12 intact, no focal abnormalities noted Psych:  Normal affect    EKG:  The ECG that was done was personally reviewed and demonstrates sinus with subtle ST elevation leads V2 and V3 with Q waves.   Relevant CV Studies:   Laboratory Data:  High Sensitivity Troponin:   Recent Labs  Lab 02/12/21 1628  TROPONINIHS 5,060*      Chemistry Recent Labs  Lab 02/12/21 1628  NA 134*  K 5.0  CL 102  CO2 24  GLUCOSE 171*  BUN 18  CREATININE 0.96  CALCIUM  9.0  GFRNONAA >60  ANIONGAP 8    No results for input(s): PROT, ALBUMIN, AST, ALT, ALKPHOS, BILITOT in the last 168 hours. Hematology Recent Labs  Lab 02/12/21 1628  WBC 11.4*  RBC 3.76*  HGB 11.2*  HCT 34.7*  MCV 92.3  MCH 29.8  MCHC 32.3  RDW 13.0  PLT 165   BNPNo results for input(s): BNP, PROBNP in the last 168 hours.  DDimer No results for input(s): DDIMER in the last 168 hours.   Radiology/Studies:  DG Chest 2 View  Result Date: 02/12/2021 CLINICAL DATA:  Chest pain EXAM: CHEST - 2 VIEW COMPARISON:  Chest x-ray 03/01/2016 FINDINGS: There is some patchy and strandy opacities in both lung bases. There is no pleural effusion or pneumothorax. The cardiomediastinal silhouette is within normal limits. No acute fractures are seen. IMPRESSION: Minimal bibasilar infiltrates may represent atelectasis or infection. Electronically Signed   By: Ronney Asters M.D.   On: 02/12/2021 17:19     Assessment and Plan:   NSTEMI: Pt with ongoing chest pain. Elevated troponin. His EKG does not meet STEMI criteria but with ongoing pain will plan emergent cardiac cath.  2. DM 3. HTN 4. HLD   Risk Assessment/Risk Scores:    TIMI Risk Score for Unstable Angina or Non-ST Elevation MI:   The patient's TIMI risk score is 4 , which indicates a 20 % risk of all cause mortality, new or recurrent myocardial infarction or need for urgent revascularization in the next 14 days.        Severity of Illness: The appropriate patient status for this patient is OBSERVATION. Observation status is judged to be reasonable and necessary in order to provide the required intensity of service to ensure the patient's safety. The patient's presenting symptoms, physical exam findings, and initial radiographic and laboratory data in the context of their medical condition is felt to place them at decreased risk for further clinical deterioration. Furthermore, it is anticipated that the patient will be medically stable for discharge from the hospital within 2 midnights of admission. The following factors support the patient status of observation.   " The patient's presenting symptoms include chest pain " The physical exam findings include  " The initial radiographic and laboratory data are mild ST elevation on EKG, elevated troponin  For questions or updates, please contact [facility] Please consult [email or web address] for contact info under     Signed, Lauree Chandler, MD  02/12/2021 7:21 PM

## 2021-02-12 NOTE — ED Notes (Signed)
Paged out code stemi on patient

## 2021-02-12 NOTE — ED Provider Notes (Signed)
Emergency Medicine Provider Triage Evaluation Note  Lawrence [name] , a 55 y.o. male  was evaluated in triage.  Pt complains of chest pain and SOB since last night. Symptoms started at 10:30pm. Seen at [facility], sent here for evaluation. Has not taken anything for pain. Hx of hyperlipidemia.  Review of Systems  Positive: CP, SOB, cough, headache, chest pain Negative: Recent illness, dizziness, falls, syncope  Physical Exam  BP 118/67 (BP Location: Right Arm)   Pulse 97   Temp 100.3 F (37.9 C) (Oral)   Resp 18   Ht '5\' 7"'$  (1.702 m)   Wt 88.5 kg   SpO2 98%   BMI 30.54 kg/m  Gen:   Awake, no distress   Resp:  Normal effort  MSK:   Moves extremities without difficulty  Other:    Medical Decision Making  Medically screening exam initiated at 4:19 PM.  Appropriate orders placed.  Lawrence [name] was informed that the remainder of the evaluation will be completed by another provider, this initial triage assessment does not replace that evaluation, and the importance of remaining in the ED until their evaluation is complete.     Estill Cotta 02/12/21 1623    Tegeler, Gwenyth Allegra, MD 02/12/21 [phone]

## 2021-02-12 NOTE — ED Provider Notes (Signed)
[facility] EMERGENCY DEPARTMENT Provider Note   CSN: [id number] Arrival date & time: 02/12/21  1559     History Chief Complaint  Patient presents with   Chest Pain    Lawrence [name] is a 55 y.o. male.  HPI Patient reports started having chest pain at about 1030 last night.  He reports the pain is in his left mid and upper chest.  It does radiate up toward the back of his neck.  He reports he has felt somewhat short of breath all night.  He reports at times he felt lightheaded as though he might pass out.  Pain has been fairly constant all night.  He reports at about 6 AM it let up for a little while but then got worse again.  He reports he went to the urgent care this morning and was referred to the emergency department.  Patient reports he was feeling well before the symptoms came on.  He does have history of diabetes, hypercholesterolemia and hypertension.  No cardiac history that he is aware of.  He denies he has been sick recently.  He has not had COVID.  He is a non-smoker.  He denies alcohol use.  Patient reports occasionally using THC oil for control of right knee pain that is chronic.    Past Medical History:  Diagnosis Date   Diabetes mellitus without complication [facility])     Patient Active Problem List   Diagnosis Date Noted   Retained orthopedic hardware 06/12/2018   Unilateral primary osteoarthritis, right knee 09/19/2017   Chronic pain of right knee 09/19/2017   Status post arthroscopy of right knee 09/19/2017   Fall 03/08/2016   Fever 03/05/2016   Diabetes ([place]) 02/28/2016   Tibial plateau fracture 02/27/2016    Past Surgical History:  Procedure Laterality Date   EXTERNAL FIXATION LEG Right 02/27/2016   Procedure: EXTERNAL FIXATION LEG;  Surgeon: Netta Cedars, MD;  Location: [place];  Service: Orthopedics;  Laterality: Right;  reduction and external fixation right tibial plateau fracture   EXTERNAL FIXATION REMOVAL Right 03/04/2016   Procedure:  REMOVAL EXTERNAL FIXATION LEG;  Surgeon: Altamese Lecanto, MD;  Location: [place];  Service: Orthopedics;  Laterality: Right;   HARVEST BONE GRAFT Left 03/04/2016   Procedure: Rimmed IM aspirate;  Surgeon: Altamese Lamoni, MD;  Location: [place];  Service: Orthopedics;  Laterality: Left;   ORIF TIBIA PLATEAU Right 03/04/2016   Procedure: OPEN REDUCTION INTERNAL FIXATION (ORIF) TIBIAL PLATEAU;  Surgeon: Altamese Walker, MD;  Location: [place];  Service: Orthopedics;  Laterality: Right;       No family history on file.  Social History   Tobacco Use   Smoking status: Never   Smokeless tobacco: Never  Substance Use Topics   Alcohol use: Yes   Drug use: No    Home Medications Prior to Admission medications   Medication Sig Start Date End Date Taking? Authorizing Provider  acetaminophen (TYLENOL) 500 MG tablet Take 2 tablets (1,000 mg total) by mouth every 6 (six) hours. 03/08/16   Ainsley Spinner, PA-C  blood glucose meter kit and supplies KIT Dispense based on patient and insurance preference. Use up to four times daily as directed. (FOR ICD-9 250.00, 250.01). 03/08/16   Ainsley Spinner, PA-C  calcium citrate (CALCITRATE - DOSED IN MG ELEMENTAL CALCIUM) 950 MG tablet Take 1 tablet (200 mg of elemental calcium total) by mouth 2 (two) times daily. 03/08/16   Ainsley Spinner, PA-C  ciclopirox [facility]) 8 % solution Apply  topically at bedtime. Apply over nail and surrounding skin. Apply daily over previous coat. Remove weekly with file or polish remover. 04/24/19   Evelina Bucy, DPM  docusate sodium (COLACE) 100 MG capsule Take 1 capsule (100 mg total) by mouth 2 (two) times daily. 03/08/16   Ainsley Spinner, PA-C  FREESTYLE PRECISION NEO TEST test strip USE 1 STRIP TO CHECK GLUCOSE ONCE DAILY 07/26/20   [provider]  insulin glargine (LANTUS) 100 UNIT/ML injection Inject 0.18 mLs (18 Units total) into the skin at bedtime. 03/08/16   Verlee Monte, MD  linaclotide [facility]) 145 MCG CAPS capsule Take 1 capsule (145  mcg total) by mouth daily before breakfast. 11/22/18   Milus Banister, MD  metFORMIN (GLUCOPHAGE) 500 MG tablet Take 500 mg by mouth. 2 bid    [provider]  omega-3 acid ethyl esters (LOVAZA) 1 g capsule Take 2 capsules by mouth 2 (two) times daily. 07/29/20   [provider]  rosuvastatin (CRESTOR) 20 MG tablet Take 20 mg by mouth daily. 07/29/20   [provider]    Allergies    Patient has no known allergies.  Review of Systems   Review of Systems 10 systems reviewed and negative except as per HPI Physical Exam Updated Vital Signs BP 135/78   Pulse (!) 113   Temp 99.1 F (37.3 C) (Oral)   Resp (!) 27   Ht 5' 7"  (1.702 m)   Wt 88.5 kg   SpO2 93%   BMI 30.54 kg/m   Physical Exam Constitutional:      Comments: Alert with clear mental status.  Patient is in pain.  Very uncomfortable in appearance.  No active respiratory distress.  Color good.  HENT:     Mouth/Throat:     Pharynx: Oropharynx is clear.  Eyes:     Extraocular Movements: Extraocular movements intact.  Cardiovascular:     Rate and Rhythm: Normal rate and regular rhythm.  Pulmonary:     Comments: Slightly tachypneic but no significant respiratory distress.  Some crackles at the right base.  Left lung clear. Abdominal:     General: There is no distension.     Palpations: Abdomen is soft.     Tenderness: There is no abdominal tenderness. There is no guarding.  Musculoskeletal:        General: No swelling or tenderness. Normal range of motion.     Right lower leg: No edema.     Left lower leg: No edema.  Skin:    General: Skin is warm and dry.  Neurological:     General: No focal deficit present.     Mental Status: He is oriented to person, place, and time.     Coordination: Coordination normal.    ED Results / Procedures / Treatments   Labs (all labs ordered are listed, but only abnormal results are displayed) Labs Reviewed  BASIC METABOLIC PANEL - Abnormal; Notable for the  following components:      Result Value   Sodium 134 (*)    Glucose, Bld 171 (*)    All other components within normal limits  CBC - Abnormal; Notable for the following components:   WBC 11.4 (*)    RBC 3.76 (*)    Hemoglobin 11.2 (*)    HCT 34.7 (*)    All other components within normal limits  TROPONIN I (HIGH SENSITIVITY) - Abnormal; Notable for the following components:   Troponin I (High Sensitivity) 5,060 (*)  All other components within normal limits  RESP PANEL BY RT-PCR (FLU A&B, COVID) ARPGX2  HEMOGLOBIN A1C  PROTIME-INR  APTT  LIPID PANEL  TROPONIN I (HIGH SENSITIVITY)    EKG EKG Interpretation  Date/Time:  Thursday February 12 2021 18:32:20 EDT Ventricular Rate:  112 PR Interval:  185 QRS Duration: 100 QT Interval:  317 QTC Calculation: 433 R Axis:   94 Text Interpretation: Sinus tachycardia Borderline right axis deviation Anteroseptal infarct, old Borderline T abnormalities, inferior leads question increase anterior coving since previous, esp V2. will activate STEMI Confirmed by Charlesetta Shanks [phone]) on 02/12/2021 6:53:31 PM  Radiology DG Chest 2 View  Result Date: 02/12/2021 CLINICAL DATA:  Chest pain EXAM: CHEST - 2 VIEW COMPARISON:  Chest x-ray 03/01/2016 FINDINGS: There is some patchy and strandy opacities in both lung bases. There is no pleural effusion or pneumothorax. The cardiomediastinal silhouette is within normal limits. No acute fractures are seen. IMPRESSION: Minimal bibasilar infiltrates may represent atelectasis or infection. Electronically Signed   By: Ronney Asters M.D.   On: 02/12/2021 17:19    Procedures Procedures  CRITICAL CARE Performed by: Charlesetta Shanks   Total critical care time: 30 minutes  Critical care time was exclusive of separately billable procedures and treating other patients.  Critical care was necessary to treat or prevent imminent or life-threatening deterioration.  Critical care was time spent personally by me on  the following activities: development of treatment plan with patient and/or surrogate as well as nursing, discussions with consultants, evaluation of patient's response to treatment, examination of patient, obtaining history from patient or surrogate, ordering and performing treatments and interventions, ordering and review of laboratory studies, ordering and review of radiographic studies, pulse oximetry and re-evaluation of patient's condition.  Medications Ordered in ED Medications  0.9 %  sodium chloride infusion (has no administration in time range)  nitroGLYCERIN (NITROSTAT) SL tablet 0.4 mg (0.4 mg Sublingual Given 02/12/21 1841)  aspirin chewable tablet 324 mg (324 mg Oral Given 02/12/21 1828)  heparin injection 4,000 Units (4,000 Units Intravenous Given 02/12/21 1829)  morphine 4 MG/ML injection 4 mg (4 mg Intravenous Given 02/12/21 1841)    ED Course  I have reviewed the triage vital signs and the nursing notes.  Pertinent labs & imaging results that were available during my care of the patient were reviewed by me and considered in my medical decision making (see chart for details).  Clinical Course as of 02/12/21 1904  Thu Feb 12, 2021  1838 Consult: Cardiology Dr. Julieanne Manson.  He will evaluate the EKGs and review with interventional cardiology.  I had just upgraded to STEMI based on coving anterior segments on repeat EKG. [MP]  1843 Consult: Dr. Angelena Form will be coming to evaluate patient in the emergency department for probable evolving STEMI versus NSTEMI. [MP]    Clinical Course User Index [MP] Charlesetta Shanks, MD   MDM Rules/Calculators/A&P                         {Remember to document critical care time when appropriate Presents with active chest pain since yesterday evening.  Troponin is significantly elevated at greater than 5000.  Patient's EKG shows poor R wave progression compared to previous.  Repeat EKG suggest some increased coving anteriorly.  At that time upgraded to  STEMI and reviewed with Dr. Angelena Form, after evaluation, plan will be to take to Cath lab.  Did get improvement in pain after administration of aspirin, heparin, morphine and  nitroglycerin.  Not pain-free but much improved.  Patient will be going directly to the Cath Lab. Final Clinical Impression(s) / ED Diagnoses Final diagnoses:  NSTEMI (non-ST elevated myocardial infarction) [facility])    Rx / [place] Orders ED Discharge Orders     None        Charlesetta Shanks, MD 02/12/21 1907

## 2021-02-12 NOTE — ED Notes (Signed)
Placed pt on zoll.  Shaved pt's groin area bilat

## 2021-02-12 NOTE — ED Triage Notes (Signed)
C/O chest pain started last night along w/ dizziness, cough and shortness of breath

## 2021-02-13 ENCOUNTER — Encounter (HOSPITAL_COMMUNITY): Payer: Self-pay | Admitting: Cardiovascular Disease

## 2021-02-13 ENCOUNTER — Other Ambulatory Visit (HOSPITAL_COMMUNITY): Payer: Self-pay

## 2021-02-13 ENCOUNTER — Inpatient Hospital Stay (HOSPITAL_COMMUNITY): Payer: PPO

## 2021-02-13 DIAGNOSIS — E7800 Pure hypercholesterolemia, unspecified: Secondary | ICD-10-CM | POA: Diagnosis not present

## 2021-02-13 DIAGNOSIS — I2102 ST elevation (STEMI) myocardial infarction involving left anterior descending coronary artery: Secondary | ICD-10-CM | POA: Diagnosis not present

## 2021-02-13 DIAGNOSIS — I509 Heart failure, unspecified: Secondary | ICD-10-CM

## 2021-02-13 DIAGNOSIS — E119 Type 2 diabetes mellitus without complications: Secondary | ICD-10-CM

## 2021-02-13 DIAGNOSIS — I2510 Atherosclerotic heart disease of native coronary artery without angina pectoris: Secondary | ICD-10-CM

## 2021-02-13 LAB — BASIC METABOLIC PANEL
Anion gap: 8 (ref 5–15)
BUN: 18 mg/dL (ref 6–20)
CO2: 25 mmol/L (ref 22–32)
Calcium: 8.9 mg/dL (ref 8.9–10.3)
Chloride: 102 mmol/L (ref 98–111)
Creatinine, Ser: 0.97 mg/dL (ref 0.61–1.24)
GFR, Estimated: >60 mL/min (ref >60)
Glucose, Bld: 137 mg/dL — ABNORMAL HIGH (ref 70–99)
Potassium: 3.9 mmol/L (ref 3.5–5.1)
Sodium: 135 mmol/L (ref 135–145)

## 2021-02-13 LAB — ECHOCARDIOGRAM COMPLETE
AR max vel: 4.38 cm2
AV Area VTI: 4.21 cm2
AV Area mean vel: 3.86 cm2
AV Mean grad: 3 mmHg
AV Peak grad: 4 mmHg
Ao pk vel: 1 m/s
Area-P 1/2: 7.86 cm2
Height: 67 in
MV M vel: 3.42 m/s
MV Peak grad: 46.8 mmHg
S' Lateral: 4.9 cm
Single Plane A4C EF: 25.8 %
Weight: 3139.35 oz

## 2021-02-13 LAB — CBC
HCT: 32.9 % — ABNORMAL LOW (ref 39.0–52.0)
Hemoglobin: 11.3 g/dL — ABNORMAL LOW (ref 13.0–17.0)
MCH: 30.6 pg (ref 26.0–34.0)
MCHC: 34.3 g/dL (ref 30.0–36.0)
MCV: 89.2 fL (ref 80.0–100.0)
Platelets: 163 10*3/uL (ref 150–400)
RBC: 3.69 MIL/uL — ABNORMAL LOW (ref 4.22–5.81)
RDW: 13 % (ref 11.5–15.5)
WBC: 10.2 10*3/uL (ref 4.0–10.5)
nRBC: 0 % (ref 0.0–0.2)

## 2021-02-13 LAB — GLUCOSE, CAPILLARY
Glucose-Capillary: 144 mg/dL — ABNORMAL HIGH (ref 70–99)
Glucose-Capillary: 125 mg/dL — ABNORMAL HIGH (ref 70–99)
Glucose-Capillary: 111 mg/dL — ABNORMAL HIGH (ref 70–99)
Glucose-Capillary: 115 mg/dL — ABNORMAL HIGH (ref 70–99)

## 2021-02-13 LAB — MRSA NEXT GEN BY PCR, NASAL: MRSA by PCR Next Gen: NOT DETECTED

## 2021-02-13 MED ORDER — CHLORHEXIDINE GLUCONATE CLOTH 2 % EX PADS
6.0000 | MEDICATED_PAD | Freq: Every day | CUTANEOUS | Status: DC
  Administered 2021-02-13 – 2021-02-16 (×3): 6 via TOPICAL

## 2021-02-13 MED ORDER — PNEUMOCOCCAL VAC POLYVALENT 25 MCG/0.5ML IJ INJ
0.5000 mL | INJECTION | INTRAMUSCULAR | Status: DC
  Filled 2021-02-13: qty 0.5

## 2021-02-13 MED ORDER — CARVEDILOL 12.5 MG PO TABS
12.5000 mg | ORAL_TABLET | Freq: Two times a day (BID) | ORAL | Status: DC
  Administered 2021-02-13 – 2021-02-15 (×5): 12.5 mg via ORAL
  Filled 2021-02-13 (×5): qty 1

## 2021-02-13 MED ORDER — LOSARTAN POTASSIUM 25 MG PO TABS
25.0000 mg | ORAL_TABLET | Freq: Every day | ORAL | Status: DC
  Administered 2021-02-13 – 2021-02-17 (×5): 25 mg via ORAL
  Filled 2021-02-13 (×5): qty 1

## 2021-02-13 MED FILL — Nitroglycerin IV Soln 100 MCG/ML in D5W: INTRA_ARTERIAL | Qty: 10 | Status: AC

## 2021-02-13 NOTE — Progress Notes (Signed)
1100-1200 MI education completed with pt, wife and daughter who voiced understanding. Stressed importance of brilinta with stent. Reviewed NTG use, MI restrictions, gave diabetic and heart healthy diets, walking instructions and discussed CRP 2. Referral letter to be sent to [facility] program. To recliner for lunch with call bell.  Will follow up tomorrow for ambulation. [name] RN BSN 02/13/2021 11:54 AM

## 2021-02-13 NOTE — TOC Benefit Eligibility Note (Signed)
Patient Teacher, [name] completed.    The patient is currently admitted and upon discharge could be taking Brilinta 90 mg.  The current 30 day co-pay is, $45.00.   The patient is currently admitted and upon discharge could be taking Entresto 24-26 mg.  The current 30 day co-pay is, $45.00.   The patient is currently admitted and upon discharge could be taking Jardiance 10 mg.  The current 30 day co-pay is, $45.00.   The patient is currently admitted and upon discharge could be taking Farxiga 10 mg.  Non Formulary  The patient is insured through [place], [place] Patient Advocate Specialist [place] Team Direct Number: [phone]  Fax: [phone]

## 2021-02-13 NOTE — Progress Notes (Signed)
Progress Note  Patient Name: Lawrence [name] Date of Encounter: 02/13/2021  [facility] Cardiologist: None new- Dr Angelena Form  Subjective   Feels much better. Notes when he drifts off to sleep he gets chest discomfort and wakes back up. Feels much better than yesterday.  Inpatient Medications    Scheduled Meds:  aspirin  81 mg Oral Daily   Chlorhexidine Gluconate Cloth  6 each Topical Daily   docusate sodium  100 mg Oral BID   influenza vac split quadrivalent PF  0.5 mL Intramuscular Tomorrow-1000   insulin aspart  0-15 Units Subcutaneous TID WC   insulin glargine-yfgn  18 Units Subcutaneous QHS   linaclotide  145 mcg Oral QAC breakfast   metoprolol tartrate  25 mg Oral BID   pneumococcal 23 valent vaccine  0.5 mL Intramuscular Tomorrow-1000   rosuvastatin  40 mg Oral Daily   sodium chloride flush  3 mL Intravenous Q12H   ticagrelor  90 mg Oral BID   Continuous Infusions:  sodium chloride     sodium chloride     PRN Meds: sodium chloride, acetaminophen, nitroGLYCERIN, ondansetron (ZOFRAN) IV, sodium chloride flush   Vital Signs    Vitals:   02/13/21 0400 02/13/21 0500 02/13/21 0600 02/13/21 0757  BP: 117/75 122/77 113/80   Pulse: 91 94 90   Resp:      Temp: 98.9 F (37.2 C)   98.8 F (37.1 C)  TempSrc: Oral   Oral  SpO2: 96% 90% 92%   Weight: 89 kg     Height:        Intake/Output Summary (Last 24 hours) at 02/13/2021 0829 Last data filed at 02/13/2021 0600 Gross per 24 hour  Intake 522.65 ml  Output --  Net 522.65 ml   Last 3 Weights 02/13/2021 02/12/2021 11/22/2018  Weight (lbs) 196 lb 3.4 oz 195 lb 190 lb  Weight (kg) 89 kg 88.451 kg 86.183 kg      Telemetry    NSR - Personally Reviewed  ECG    NSR persistent ST elevation V1-4 with T wave inversion- Personally Reviewed  Physical Exam    GEN: No acute distress.   Neck: No JVD Cardiac: RRR, no murmurs, rubs, or gallops.  Respiratory: Clear to auscultation bilaterally. GI: Soft, nontender,  non-distended  MS: No edema; No deformity. Radial site without hematoma.  Neuro:  Nonfocal  Psych: Normal affect   Labs    High Sensitivity Troponin:   Recent Labs  Lab 02/12/21 1628 02/12/21 1810  TROPONINIHS 5,060* 8,363*      Chemistry Recent Labs  Lab 02/12/21 1628 02/13/21 0158  NA 134* 135  K 5.0 3.9  CL 102 102  CO2 24 25  GLUCOSE 171* 137*  BUN 18 18  CREATININE 0.96 0.97  CALCIUM 9.0 8.9  GFRNONAA >60 >60  ANIONGAP 8 8     Hematology Recent Labs  Lab 02/12/21 1628 02/13/21 0158  WBC 11.4* 10.2  RBC 3.76* 3.69*  HGB 11.2* 11.3*  HCT 34.7* 32.9*  MCV 92.3 89.2  MCH 29.8 30.6  MCHC 32.3 34.3  RDW 13.0 13.0  PLT 165 163    BNPNo results for input(s): BNP, PROBNP in the last 168 hours.   DDimer No results for input(s): DDIMER in the last 168 hours.   Radiology    DG Chest 2 View  Result Date: 02/12/2021 CLINICAL DATA:  Chest pain EXAM: CHEST - 2 VIEW COMPARISON:  Chest x-ray 03/01/2016 FINDINGS: There is some patchy and strandy opacities  in both lung bases. There is no pleural effusion or pneumothorax. The cardiomediastinal silhouette is within normal limits. No acute fractures are seen. IMPRESSION: Minimal bibasilar infiltrates may represent atelectasis or infection. Electronically Signed   By: Ronney Asters M.D.   On: 02/12/2021 17:19   CARDIAC CATHETERIZATION  Result Date: 02/12/2021   Mid RCA-1 lesion is 30% stenosed.   Mid RCA-2 lesion is 30% stenosed.   Dist LAD lesion is 99% stenosed.   1st Diag lesion is 50% stenosed.   2nd Mrg lesion is 90% stenosed.   Mid LAD lesion is 99% stenosed.   A drug-eluting stent was successfully placed using a STENT ONYX FRONTIER 2.25X30.   A drug-eluting stent was successfully placed using a STENT ONYX FRONTIER 3.0X18.   Post intervention, there is a 0% residual stenosis.   Post intervention, there is a 0% residual stenosis. Severe mid LAD stenosis. Severe apical LAD stenosis. Moderate disease in the small caliber  first diagonal branch Successful PTCA/DES x 1 mid LAD Severe obtuse marginal stenosis. Successful PTCA/DES x 1 obtuse marginal 1. Non-dominant RCA with mild disease Elevated LVEDP Recommendations: Will admit to the ICU. Continue Aggrastat drip for 2 hours. Start high intensity statin and beta blocker. DAPT with ASA and Brilinta for 12 months. Echo in the am. Resume home Lantus. SSI coverage.    Cardiac Studies   Coronary/Graft Acute MI Revascularization  CORONARY STENT INTERVENTION  LEFT HEART CATH AND CORONARY ANGIOGRAPHY   Conclusion      Mid RCA-1 lesion is 30% stenosed.   Mid RCA-2 lesion is 30% stenosed.   Dist LAD lesion is 99% stenosed.   1st Diag lesion is 50% stenosed.   2nd Mrg lesion is 90% stenosed.   Mid LAD lesion is 99% stenosed.   A drug-eluting stent was successfully placed using a STENT ONYX FRONTIER 2.25X30.   A drug-eluting stent was successfully placed using a STENT ONYX FRONTIER 3.0X18.   Post intervention, there is a 0% residual stenosis.   Post intervention, there is a 0% residual stenosis.   Severe mid LAD stenosis. Severe apical LAD stenosis. Moderate disease in the small caliber first diagonal branch Successful PTCA/DES x 1 mid LAD Severe obtuse marginal stenosis.  Successful PTCA/DES x 1 obtuse marginal 1.  Non-dominant RCA with mild disease Elevated LVEDP   Recommendations: Will admit to the ICU. Continue Aggrastat drip for 2 hours. Start high intensity statin and beta blocker. DAPT with ASA and Brilinta for 12 months. Echo in the am. Resume home Lantus. SSI coverage.    Diagnostic Dominance: Left Intervention   Patient Profile     55 y.o. male with history of HLD, DM, HTN presents with acute anterior STEMI,  Assessment & Plan    Anterior STEMI. Peak troponin 8300. Ecg with some persistent ST elevation. S/p emergent DES of mid LAD culprit and OM1. Clinically doing much better. LVEDP 31 mm Hg and given IV lasix x 1. Awaiting Echo. If LV function  low will need to consider for Entresto/aldactone. Will switch metoprolol to Coreg. On DAPT for one year. Observe in unit today DM A1c 7.7%. on metformin at home. Likely will need addition of SGLT2 inhibitor. HTN.  HLD. LDL 163. Now on high dose Crestor.  Acute CHF. EF pending by Echo. EDP high at cath. Will plan to initiate Entresto.   For questions or updates, please contact [place] Please consult [email or web address] for contact info under        Signed,  [place], MD  02/13/2021, 8:29 AM

## 2021-02-14 DIAGNOSIS — I214 Non-ST elevation (NSTEMI) myocardial infarction: Secondary | ICD-10-CM | POA: Diagnosis not present

## 2021-02-14 LAB — GLUCOSE, CAPILLARY
Glucose-Capillary: 98 mg/dL (ref 70–99)
Glucose-Capillary: 180 mg/dL — ABNORMAL HIGH (ref 70–99)
Glucose-Capillary: 127 mg/dL — ABNORMAL HIGH (ref 70–99)
Glucose-Capillary: 179 mg/dL — ABNORMAL HIGH (ref 70–99)

## 2021-02-14 MED ORDER — SPIRONOLACTONE 12.5 MG HALF TABLET
12.5000 mg | ORAL_TABLET | Freq: Every day | ORAL | Status: DC
  Administered 2021-02-14: 12.5 mg via ORAL
  Filled 2021-02-14: qty 1

## 2021-02-14 NOTE — Progress Notes (Signed)
Progress Note  Patient Name: Lawrence [name] Date of Encounter: 02/14/2021  [facility] Cardiologist: None new- Dr Angelena Form Patient Profile     55 y.o. male with history of HLD, DM, HTN presents with acute anterior STEMI,  LAD-m-99>> stented  LAD-d-99: OM-2 -90 >>stented  D1 50%,  Echo EF 30-35%    Subjective   Feels much better.  No lightheadedness.  No history of same.  Inpatient Medications    Scheduled Meds:  aspirin  81 mg Oral Daily   carvedilol  12.5 mg Oral BID WC   Chlorhexidine Gluconate Cloth  6 each Topical Daily   docusate sodium  100 mg Oral BID   influenza vac split quadrivalent PF  0.5 mL Intramuscular Tomorrow-1000   insulin aspart  0-15 Units Subcutaneous TID WC   insulin glargine-yfgn  18 Units Subcutaneous QHS   linaclotide  145 mcg Oral QAC breakfast   losartan  25 mg Oral Daily   pneumococcal 23 valent vaccine  0.5 mL Intramuscular Tomorrow-1000   rosuvastatin  40 mg Oral Daily   sodium chloride flush  3 mL Intravenous Q12H   ticagrelor  90 mg Oral BID   Continuous Infusions:  sodium chloride     sodium chloride     PRN Meds: sodium chloride, acetaminophen, nitroGLYCERIN, ondansetron (ZOFRAN) IV, sodium chloride flush   Vital Signs    Vitals:   02/14/21 0741 02/14/21 0800 02/14/21 0804 02/14/21 0900  BP:  119/70 119/79 111/65  Pulse: 90 89 91 83  Resp: (!) 22 (!) 29 18 (!) 31  Temp:      TempSrc:      SpO2: 97% 98% 95% 96%  Weight:      Height:        Intake/Output Summary (Last 24 hours) at 02/14/2021 0939 Last data filed at 02/14/2021 0900 Gross per 24 hour  Intake 843 ml  Output 352 ml  Net 491 ml    Last 3 Weights 02/13/2021 02/12/2021 11/22/2018  Weight (lbs) 196 lb 3.4 oz 195 lb 190 lb  Weight (kg) 89 kg 88.451 kg 86.183 kg      Telemetry  Without significant ventricular ectopy  ECG       Physical Exam  Well developed and nourished in no acute distress HENT normal Neck supple Clear Regular rate and rhythm, no  murmurs or gallops Abd-soft   No Clubbing cyanosis edema Skin-warm and dry A & Oriented  Grossly normal sensory and motor function    Labs    High Sensitivity Troponin:   Recent Labs  Lab 02/12/21 1628 02/12/21 1810  TROPONINIHS 5,060* 8,363*       Chemistry Recent Labs  Lab 02/12/21 1628 02/13/21 0158  NA 134* 135  K 5.0 3.9  CL 102 102  CO2 24 25  GLUCOSE 171* 137*  BUN 18 18  CREATININE 0.96 0.97  CALCIUM 9.0 8.9  GFRNONAA >60 >60  ANIONGAP 8 8      Hematology Recent Labs  Lab 02/12/21 1628 02/13/21 0158  WBC 11.4* 10.2  RBC 3.76* 3.69*  HGB 11.2* 11.3*  HCT 34.7* 32.9*  MCV 92.3 89.2  MCH 29.8 30.6  MCHC 32.3 34.3  RDW 13.0 13.0  PLT 165 163     BNPNo results for input(s): BNP, PROBNP in the last 168 hours.   DDimer No results for input(s): DDIMER in the last 168 hours.   Radiology    DG Chest 2 View  Result Date: 02/12/2021 CLINICAL DATA:  Chest  pain EXAM: CHEST - 2 VIEW COMPARISON:  Chest x-ray 03/01/2016 FINDINGS: There is some patchy and strandy opacities in both lung bases. There is no pleural effusion or pneumothorax. The cardiomediastinal silhouette is within normal limits. No acute fractures are seen. IMPRESSION: Minimal bibasilar infiltrates may represent atelectasis or infection. Electronically Signed   By: Ronney Asters M.D.   On: 02/12/2021 17:19   CARDIAC CATHETERIZATION  Result Date: 02/12/2021   Mid RCA-1 lesion is 30% stenosed.   Mid RCA-2 lesion is 30% stenosed.   Dist LAD lesion is 99% stenosed.   1st Diag lesion is 50% stenosed.   2nd Mrg lesion is 90% stenosed.   Mid LAD lesion is 99% stenosed.   A drug-eluting stent was successfully placed using a STENT ONYX FRONTIER 2.25X30.   A drug-eluting stent was successfully placed using a STENT ONYX FRONTIER 3.0X18.   Post intervention, there is a 0% residual stenosis.   Post intervention, there is a 0% residual stenosis. Severe mid LAD stenosis. Severe apical LAD stenosis. Moderate  disease in the small caliber first diagonal branch Successful PTCA/DES x 1 mid LAD Severe obtuse marginal stenosis. Successful PTCA/DES x 1 obtuse marginal 1. Non-dominant RCA with mild disease Elevated LVEDP Recommendations: Will admit to the ICU. Continue Aggrastat drip for 2 hours. Start high intensity statin and beta blocker. DAPT with ASA and Brilinta for 12 months. Echo in the am. Resume home Lantus. SSI coverage.   ECHOCARDIOGRAM COMPLETE  Result Date: 02/13/2021    ECHOCARDIOGRAM REPORT   Patient Name:   Lawrence [name] Date of Exam: 02/13/2021 Medical Rec #:  [id number]     Height:       67.0 in Accession #:    [id number]    Weight:       196.2 lb Date of Birth:  [birth year]      BSA:          2.006 m Patient Age:    73 years      BP:           109/68 mmHg Patient Gender: M             HR:           94 bpm. Exam Location:  Inpatient Procedure: 2D Echo, Cardiac Doppler and Color Doppler Indications:    CAD  History:        Patient has no prior history of Echocardiogram examinations.                 Acute MI and CAD; Risk Factors:Diabetes.  Sonographer:    MH Referring Phys: [place]  1. Left ventricular ejection fraction, by estimation, is 30 to 35%. The left ventricle has moderately decreased function. The left ventricle demonstrates regional wall motion abnormalities (see scoring diagram/findings for description). There is mild left ventricular hypertrophy. Left ventricular diastolic parameters are indeterminate.  2. Right ventricular systolic function is normal. The right ventricular size is normal. There is normal pulmonary artery systolic pressure.  3. The mitral valve is normal in structure. Trivial mitral valve regurgitation.  4. The aortic valve is tricuspid. Aortic valve regurgitation is not visualized. No aortic stenosis is present.  5. The inferior vena cava is normal in size with <50% respiratory variability, suggesting right atrial pressure of 8 mmHg. FINDINGS  Left  Ventricle: Left ventricular ejection fraction, by estimation, is 30 to 35%. The left ventricle has moderately decreased function. The left ventricle demonstrates regional wall motion abnormalities.  The left ventricular internal cavity size was normal in size. There is mild left ventricular hypertrophy. Left ventricular diastolic parameters are indeterminate.  LV Wall Scoring: The mid and distal anterior wall, mid and distal anterior septum, apical lateral segment, and apex are akinetic. The basal anteroseptal segment and basal anterior segment are hypokinetic. The antero-lateral wall, entire inferior wall, posterior wall, mid inferoseptal segment, and basal inferoseptal segment are normal. Right Ventricle: The right ventricular size is normal. No increase in right ventricular wall thickness. Right ventricular systolic function is normal. There is normal pulmonary artery systolic pressure. The tricuspid regurgitant velocity is 1.60 m/s, and  with an assumed right atrial pressure of 8 mmHg, the estimated right ventricular systolic pressure is 123XX123 mmHg. Left Atrium: Left atrial size was normal in size. Right Atrium: Right atrial size was normal in size. Pericardium: There is no evidence of pericardial effusion. Mitral Valve: The mitral valve is normal in structure. Trivial mitral valve regurgitation. Tricuspid Valve: The tricuspid valve is normal in structure. Tricuspid valve regurgitation is trivial. Aortic Valve: The aortic valve is tricuspid. Aortic valve regurgitation is not visualized. No aortic stenosis is present. Aortic valve mean gradient measures 3.0 mmHg. Aortic valve peak gradient measures 4.0 mmHg. Aortic valve area, by VTI measures 4.21 cm. Pulmonic Valve: The pulmonic valve was not well visualized. Pulmonic valve regurgitation is trivial. Aorta: The aortic root and ascending aorta are structurally normal, with no evidence of dilitation. Venous: The inferior vena cava is normal in size with less than  50% respiratory variability, suggesting right atrial pressure of 8 mmHg. IAS/Shunts: The interatrial septum was not well visualized.  LEFT VENTRICLE PLAX 2D LVIDd:         5.40 cm     Diastology LVIDs:         4.90 cm     LV e' medial:    9.46 cm/s LV PW:         1.20 cm     LV E/e' medial:  7.6 LV IVS:        1.10 cm     LV e' lateral:   9.90 cm/s LVOT diam:     2.70 cm     LV E/e' lateral: 7.3 LV SV:         78 LV SV Index:   39 LVOT Area:     5.73 cm  LV Volumes (MOD) LV vol d, MOD A4C: 82.8 ml LV vol s, MOD A4C: 61.4 ml LV SV MOD A4C:     82.8 ml RIGHT VENTRICLE            IVC RV S prime:     7.07 cm/s  IVC diam: 2.10 cm TAPSE (M-mode): 2.5 cm LEFT ATRIUM             Index       RIGHT ATRIUM           Index LA diam:        3.30 cm 1.65 cm/m  RA Area:     17.20 cm LA Vol (A2C):   46.7 ml 23.28 ml/m RA Volume:   40.40 ml  20.14 ml/m LA Vol (A4C):   40.1 ml 19.99 ml/m LA Biplane Vol: 43.8 ml 21.84 ml/m  AORTIC VALVE                   PULMONIC VALVE AV Area (Vmax):    4.38 cm    PV Vmax:       0.50  m/s AV Area (Vmean):   3.86 cm    PV Peak grad:  1.0 mmHg AV Area (VTI):     4.21 cm AV Vmax:           100.00 cm/s AV Vmean:          81.200 cm/s AV VTI:            0.185 m AV Peak Grad:      4.0 mmHg AV Mean Grad:      3.0 mmHg LVOT Vmax:         76.50 cm/s LVOT Vmean:        54.800 cm/s LVOT VTI:          0.136 m LVOT/AV VTI ratio: 0.74  AORTA Ao Root diam: 3.60 cm Ao Asc diam:  3.10 cm MITRAL VALVE               TRICUSPID VALVE MV Area (PHT): 7.86 cm    TR Peak grad:   10.2 mmHg MR Peak grad: 46.8 mmHg    TR Vmax:        160.00 cm/s MR Vmax:      342.00 cm/s MV E velocity: 72.00 cm/s  SHUNTS MV A velocity: 59.10 cm/s  Systemic VTI:  0.14 m MV E/A ratio:  1.22        Systemic Diam: 2.70 cm Oswaldo Milian MD Electronically signed by Oswaldo Milian MD Signature Date/Time: 02/13/2021/4:52:39 PM    Final     Cardiac Studies   Coronary/Graft Acute MI Revascularization  CORONARY STENT INTERVENTION   LEFT HEART CATH AND CORONARY ANGIOGRAPHY   Conclusion      Mid RCA-1 lesion is 30% stenosed.   Mid RCA-2 lesion is 30% stenosed.   Dist LAD lesion is 99% stenosed.   1st Diag lesion is 50% stenosed.   2nd Mrg lesion is 90% stenosed.   Mid LAD lesion is 99% stenosed.   A drug-eluting stent was successfully placed using a STENT ONYX FRONTIER 2.25X30.   A drug-eluting stent was successfully placed using a STENT ONYX FRONTIER 3.0X18.   Post intervention, there is a 0% residual stenosis.   Post intervention, there is a 0% residual stenosis.   Severe mid LAD stenosis. Severe apical LAD stenosis. Moderate disease in the small caliber first diagonal branch Successful PTCA/DES x 1 mid LAD Severe obtuse marginal stenosis.  Successful PTCA/DES x 1 obtuse marginal 1.  Non-dominant RCA with mild disease Elevated LVEDP   Recommendations: Will admit to the ICU. Continue Aggrastat drip for 2 hours. Start high intensity statin and beta blocker. DAPT with ASA and Brilinta for 12 months. Echo in the am. Resume home Lantus. SSI coverage.    Diagnostic Dominance: Left Intervention     Assessment & Plan     Anterior wall MI with significant left ventricular dysfunction  Diabetes  Hypertension  Hyperlipidemia  Anemia   recurrent     Continue Entresto.  Carvedilol. Add spironolactone for early has her benefit. DAPT x1 year. Continue metformin. Continue Crestor. Transfer to floor.    Signed, Virl Axe, MD  02/14/2021, 9:39 AM

## 2021-02-14 NOTE — Progress Notes (Signed)
CARDIAC REHAB PHASE I   PRE:  Rate/Rhythm: 86 SR  BP:  Supine: 111/65  Sitting:   Standing:    SaO2: 95%RA  MODE:  Ambulation: 370 ft   POST:  Rate/Rhythm: 87 SR  BP:  Supine:   Sitting: 102/67  Standing:    SaO2: 99%RA 0936-1006 Pt walked 370 ft on RA with rolling walker. Has walker at home if needed. Usually walks with cane. Tolerated well. No CP. To recliner with call bell. No questions re ed done yesterday. Very attentive to ed yesterday and recited some of our education.   [name], RN BSN  02/14/2021 10:02 AM

## 2021-02-15 DIAGNOSIS — I214 Non-ST elevation (NSTEMI) myocardial infarction: Secondary | ICD-10-CM | POA: Diagnosis not present

## 2021-02-15 LAB — GLUCOSE, CAPILLARY
Glucose-Capillary: 83 mg/dL (ref 70–99)
Glucose-Capillary: 139 mg/dL — ABNORMAL HIGH (ref 70–99)
Glucose-Capillary: 156 mg/dL — ABNORMAL HIGH (ref 70–99)
Glucose-Capillary: 94 mg/dL (ref 70–99)

## 2021-02-15 MED ORDER — CARVEDILOL 3.125 MG PO TABS
3.1250 mg | ORAL_TABLET | Freq: Two times a day (BID) | ORAL | Status: DC
  Administered 2021-02-15 – 2021-02-17 (×4): 3.125 mg via ORAL
  Filled 2021-02-15 (×4): qty 1

## 2021-02-15 NOTE — Progress Notes (Signed)
Progress Note  Patient Name: Lawrence [name] Date of Encounter: 02/15/2021  [facility] Cardiologist: None new- Dr Angelena Form Patient Profile     55 y.o. male with history of HLD, DM, HTN presents with acute anterior STEMI,  LAD-m-99>> stented  LAD-d-99: OM-2 -90 >>stented  D1 50%,  Echo EF 30-35%    Subjective  Without chest pain or SOB Low BP through night    Q related to BP cuff Inpatient Medications    Scheduled Meds:  aspirin  81 mg Oral Daily   carvedilol  12.5 mg Oral BID WC   Chlorhexidine Gluconate Cloth  6 each Topical Daily   docusate sodium  100 mg Oral BID   influenza vac split quadrivalent PF  0.5 mL Intramuscular Tomorrow-1000   insulin aspart  0-15 Units Subcutaneous TID WC   insulin glargine-yfgn  18 Units Subcutaneous QHS   linaclotide  145 mcg Oral QAC breakfast   losartan  25 mg Oral Daily   pneumococcal 23 valent vaccine  0.5 mL Intramuscular Tomorrow-1000   rosuvastatin  40 mg Oral Daily   sodium chloride flush  3 mL Intravenous Q12H   spironolactone  12.5 mg Oral Daily   ticagrelor  90 mg Oral BID   Continuous Infusions:  sodium chloride     sodium chloride     PRN Meds: sodium chloride, acetaminophen, nitroGLYCERIN, ondansetron (ZOFRAN) IV, sodium chloride flush   Vital Signs    Vitals:   02/15/21 0500 02/15/21 0600 02/15/21 0700 02/15/21 0743  BP: (!) 87/53 (!) 83/46 99/72 118/81  Pulse: 73 71 73   Resp: (!) 26 19 (!) 3   Temp:      TempSrc:      SpO2: 96% 94% 95%   Weight:      Height:        Intake/Output Summary (Last 24 hours) at 02/15/2021 0831 Last data filed at 02/15/2021 0000 Gross per 24 hour  Intake 1600 ml  Output --  Net 1600 ml    Last 3 Weights 02/13/2021 02/12/2021 11/22/2018  Weight (lbs) 196 lb 3.4 oz 195 lb 190 lb  Weight (kg) 89 kg 88.451 kg 86.183 kg      Telemetry  sinus  ECG       Physical Exam  Well developed and nourished in no acute distress HENT normal Neck supple with JVP-  flat   Clear Regular rate and rhythm, no murmurs or gallops Abd-soft with active BS No Clubbing cyanosis edema Skin-warm and dry A & Oriented  Grossly normal sensory and motor function    Labs    High Sensitivity Troponin:   Recent Labs  Lab 02/12/21 1628 02/12/21 1810  TROPONINIHS 5,060* 8,363*       Chemistry Recent Labs  Lab 02/12/21 1628 02/13/21 0158  NA 134* 135  K 5.0 3.9  CL 102 102  CO2 24 25  GLUCOSE 171* 137*  BUN 18 18  CREATININE 0.96 0.97  CALCIUM 9.0 8.9  GFRNONAA >60 >60  ANIONGAP 8 8      Hematology Recent Labs  Lab 02/12/21 1628 02/13/21 0158  WBC 11.4* 10.2  RBC 3.76* 3.69*  HGB 11.2* 11.3*  HCT 34.7* 32.9*  MCV 92.3 89.2  MCH 29.8 30.6  MCHC 32.3 34.3  RDW 13.0 13.0  PLT 165 163     BNPNo results for input(s): BNP, PROBNP in the last 168 hours.   DDimer No results for input(s): DDIMER in the last 168 hours.   Radiology  Cardiac Studies   Coronary/Graft Acute MI Revascularization  CORONARY STENT INTERVENTION  LEFT HEART CATH AND CORONARY ANGIOGRAPHY   Conclusion      Mid RCA-1 lesion is 30% stenosed.   Mid RCA-2 lesion is 30% stenosed.   Dist LAD lesion is 99% stenosed.   1st Diag lesion is 50% stenosed.   2nd Mrg lesion is 90% stenosed.   Mid LAD lesion is 99% stenosed.   A drug-eluting stent was successfully placed using a STENT ONYX FRONTIER 2.25X30.   A drug-eluting stent was successfully placed using a STENT ONYX FRONTIER 3.0X18.   Post intervention, there is a 0% residual stenosis.   Post intervention, there is a 0% residual stenosis.   Severe mid LAD stenosis. Severe apical LAD stenosis. Moderate disease in the small caliber first diagonal branch Successful PTCA/DES x 1 mid LAD Severe obtuse marginal stenosis.  Successful PTCA/DES x 1 obtuse marginal 1.  Non-dominant RCA with mild disease Elevated LVEDP   Recommendations: Will admit to the ICU. Continue Aggrastat drip for 2 hours. Start high intensity  statin and beta blocker. DAPT with ASA and Brilinta for 12 months. Echo in the am. Resume home Lantus. SSI coverage.    Diagnostic Dominance: Left Intervention     Assessment & Plan     Anterior wall MI with significant left ventricular dysfunction  Diabetes  Hypertension>> low BP  Hyperlipidemia  Anemia   recurrent     Continue losartan, decrease carvedilol Hold aldactone but would resume as possible for early hazard benefit    DAPT x1 year. Continue metformin. Continue Crestor. Transfer to floor.    Signed, Virl Axe, MD  02/15/2021, 8:31 AM

## 2021-02-15 NOTE — Progress Notes (Signed)
Pt arrived to 4e from 2h. Pt and family oriented to room and staff. Vitals obtained. Telemetry box applied and CCMD notified x2 verifiers.

## 2021-02-16 ENCOUNTER — Encounter (HOSPITAL_COMMUNITY): Payer: Self-pay | Admitting: Cardiovascular Disease

## 2021-02-16 ENCOUNTER — Other Ambulatory Visit (HOSPITAL_COMMUNITY): Payer: Self-pay

## 2021-02-16 ENCOUNTER — Inpatient Hospital Stay (HOSPITAL_COMMUNITY): Payer: PPO

## 2021-02-16 ENCOUNTER — Telehealth (HOSPITAL_COMMUNITY): Payer: Self-pay

## 2021-02-16 DIAGNOSIS — I255 Ischemic cardiomyopathy: Secondary | ICD-10-CM | POA: Diagnosis not present

## 2021-02-16 DIAGNOSIS — I501 Left ventricular failure: Secondary | ICD-10-CM

## 2021-02-16 DIAGNOSIS — I214 Non-ST elevation (NSTEMI) myocardial infarction: Secondary | ICD-10-CM | POA: Diagnosis not present

## 2021-02-16 DIAGNOSIS — E785 Hyperlipidemia, unspecified: Secondary | ICD-10-CM

## 2021-02-16 DIAGNOSIS — I10 Essential (primary) hypertension: Secondary | ICD-10-CM

## 2021-02-16 LAB — GLUCOSE, CAPILLARY
Glucose-Capillary: 93 mg/dL (ref 70–99)
Glucose-Capillary: 175 mg/dL — ABNORMAL HIGH (ref 70–99)
Glucose-Capillary: 163 mg/dL — ABNORMAL HIGH (ref 70–99)
Glucose-Capillary: 133 mg/dL — ABNORMAL HIGH (ref 70–99)

## 2021-02-16 LAB — BASIC METABOLIC PANEL
Anion gap: 6 (ref 5–15)
BUN: 22 mg/dL — ABNORMAL HIGH (ref 6–20)
CO2: 23 mmol/L (ref 22–32)
Calcium: 9 mg/dL (ref 8.9–10.3)
Chloride: 104 mmol/L (ref 98–111)
Creatinine, Ser: 1.05 mg/dL (ref 0.61–1.24)
GFR, Estimated: >60 mL/min (ref >60)
Glucose, Bld: 181 mg/dL — ABNORMAL HIGH (ref 70–99)
Potassium: 4 mmol/L (ref 3.5–5.1)
Sodium: 133 mmol/L — ABNORMAL LOW (ref 135–145)

## 2021-02-16 LAB — ECHOCARDIOGRAM LIMITED
Height: 67 in
S' Lateral: 4.7 cm
Weight: 3132.3 oz

## 2021-02-16 MED ORDER — SPIRONOLACTONE 25 MG PO TABS
12.5000 mg | ORAL_TABLET | Freq: Every day | ORAL | 1 refills | Status: DC
  Filled 2021-02-16: qty 45, 90d supply, fill #0

## 2021-02-16 MED ORDER — NITROGLYCERIN 0.4 MG SL SUBL
0.4000 mg | SUBLINGUAL_TABLET | SUBLINGUAL | 2 refills | Status: AC | PRN
  Filled 2021-02-16: qty 25, 7d supply, fill #0

## 2021-02-16 MED ORDER — ROSUVASTATIN CALCIUM 40 MG PO TABS
40.0000 mg | ORAL_TABLET | Freq: Every day | ORAL | 1 refills | Status: AC
  Filled 2021-02-16: qty 90, 90d supply, fill #0

## 2021-02-16 MED ORDER — TICAGRELOR 90 MG PO TABS
90.0000 mg | ORAL_TABLET | Freq: Two times a day (BID) | ORAL | 2 refills | Status: DC
  Filled 2021-02-16: qty 60, 30d supply, fill #0

## 2021-02-16 MED ORDER — ASPIRIN 81 MG PO CHEW
81.0000 mg | CHEWABLE_TABLET | Freq: Every day | ORAL | 2 refills | Status: DC
  Filled 2021-02-16: qty 90, 90d supply, fill #0

## 2021-02-16 MED ORDER — SPIRONOLACTONE 12.5 MG HALF TABLET
12.5000 mg | ORAL_TABLET | Freq: Every day | ORAL | Status: DC
  Administered 2021-02-16 – 2021-02-17 (×2): 12.5 mg via ORAL
  Filled 2021-02-16 (×2): qty 1

## 2021-02-16 MED ORDER — PERFLUTREN LIPID MICROSPHERE
1.0000 mL | INTRAVENOUS | Status: AC | PRN
  Administered 2021-02-16: 1 mL via INTRAVENOUS
  Filled 2021-02-16: qty 10

## 2021-02-16 MED ORDER — LOSARTAN POTASSIUM 25 MG PO TABS
25.0000 mg | ORAL_TABLET | Freq: Every day | ORAL | 1 refills | Status: DC
  Filled 2021-02-16: qty 90, 90d supply, fill #0

## 2021-02-16 MED ORDER — CARVEDILOL 3.125 MG PO TABS
3.1250 mg | ORAL_TABLET | Freq: Two times a day (BID) | ORAL | 1 refills | Status: DC
  Filled 2021-02-16: qty 180, 90d supply, fill #0

## 2021-02-16 MED ORDER — EMPAGLIFLOZIN 10 MG PO TABS
10.0000 mg | ORAL_TABLET | Freq: Every day | ORAL | Status: DC
  Administered 2021-02-17: 10 mg via ORAL
  Filled 2021-02-16 (×2): qty 1

## 2021-02-16 MED ORDER — EMPAGLIFLOZIN 10 MG PO TABS
10.0000 mg | ORAL_TABLET | Freq: Every day | ORAL | 0 refills | Status: DC
  Filled 2021-02-16: qty 14, 14d supply, fill #0

## 2021-02-16 NOTE — Progress Notes (Signed)
CARDIAC REHAB PHASE I   PRE:  Rate/Rhythm: 79 SR  BP:  Supine:   Sitting: 123/78  Standing:    SaO2: 99%RA  MODE:  Ambulation: 670 ft   POST:  Rate/Rhythm: 90 SR  BP:  Supine:   Sitting: 130/76  Standing:    SaO2: 100%RA 0849-0930 Pt walked 670 ft on RA with his cane with steady gait. Tolerated well with no CP. To recliner with call bell. Since EF low, gave pt CHF booklet and reviewed yellow zone and when to call MD. Discussed importance of daily weights and watching sodium (2000 mg) restriction. Gave low sodium handouts. Reviewed walking instructions again as pt had questions.    [name], RN BSN  02/16/2021 9:27 AM

## 2021-02-16 NOTE — Discharge Summary (Addendum)
Discharge Summary    Patient ID: Lawrence [name] MRN: [id number]; DOB: [birth year]  Admit date: 02/12/2021 Discharge date: 02/17/2021  PCP:  Melony Overly, MD   [facility] Providers Cardiologist:  Lauree Chandler, MD   Discharge Diagnoses    Principal Problem:   STEMI (ST elevation myocardial infarction) [facility]) Active Problems:   Diabetes ([place])   Hyperlipidemia   Hypertension   Ischemic cardiomyopathy   Status post coronary artery stent placement  Diagnostic Studies/Procedures    Cath: 02/12/21  Mid RCA-1 lesion is 30% stenosed.   Mid RCA-2 lesion is 30% stenosed.   Dist LAD lesion is 99% stenosed.   1st Diag lesion is 50% stenosed.   2nd Mrg lesion is 90% stenosed.   Mid LAD lesion is 99% stenosed.   A drug-eluting stent was successfully placed using a STENT ONYX FRONTIER 2.25X30.   A drug-eluting stent was successfully placed using a STENT ONYX FRONTIER 3.0X18.   Post intervention, there is a 0% residual stenosis.   Post intervention, there is a 0% residual stenosis.   Severe mid LAD stenosis. Severe apical LAD stenosis. Moderate disease in the small caliber first diagonal branch Successful PTCA/DES x 1 mid LAD Severe obtuse marginal stenosis.  Successful PTCA/DES x 1 obtuse marginal 1.  Non-dominant RCA with mild disease Elevated LVEDP   Recommendations: Will admit to the ICU. Continue Aggrastat drip for 2 hours. Start high intensity statin and beta blocker. DAPT with ASA and Brilinta for 12 months. Echo in the am. Resume home Lantus. SSI coverage.   Diagnostic Dominance: Left Intervention    Echo: 02/13/21  IMPRESSIONS     1. Left ventricular ejection fraction, by estimation, is 30 to 35%. The  left ventricle has moderately decreased function. The left ventricle  demonstrates regional wall motion abnormalities (see scoring  diagram/findings for description). There is mild  left ventricular hypertrophy. Left ventricular diastolic parameters are   indeterminate.   2. Right ventricular systolic function is normal. The right ventricular  size is normal. There is normal pulmonary artery systolic pressure.   3. The mitral valve is normal in structure. Trivial mitral valve  regurgitation.   4. The aortic valve is tricuspid. Aortic valve regurgitation is not  visualized. No aortic stenosis is present.   5. The inferior vena cava is normal in size with <50% respiratory  variability, suggesting right atrial pressure of 8 mmHg.   FINDINGS   Left Ventricle: Left ventricular ejection fraction, by estimation, is 30  to 35%. The left ventricle has moderately decreased function. The left  ventricle demonstrates regional wall motion abnormalities. The left  ventricular internal cavity size was  normal in size. There is mild left ventricular hypertrophy. Left  ventricular diastolic parameters are indeterminate.      LV Wall Scoring:  The mid and distal anterior wall, mid and distal anterior septum, apical  lateral segment, and apex are akinetic. The basal anteroseptal segment and  basal anterior segment are hypokinetic. The antero-lateral wall, entire  inferior wall, posterior wall, mid inferoseptal segment, and basal  inferoseptal segment are normal.   Right Ventricle: The right ventricular size is normal. No increase in  right ventricular wall thickness. Right ventricular systolic function is  normal. There is normal pulmonary artery systolic pressure. The tricuspid  regurgitant velocity is 1.60 m/s, and   with an assumed right atrial pressure of 8 mmHg, the estimated right  ventricular systolic pressure is 62.1 mmHg.   Left Atrium: Left atrial size was normal  in size.   Right Atrium: Right atrial size was normal in size.   Pericardium: There is no evidence of pericardial effusion.   Mitral Valve: The mitral valve is normal in structure. Trivial mitral  valve regurgitation.   Tricuspid Valve: The tricuspid valve is normal in  structure. Tricuspid  valve regurgitation is trivial.   Aortic Valve: The aortic valve is tricuspid. Aortic valve regurgitation is  not visualized. No aortic stenosis is present. Aortic valve mean gradient  measures 3.0 mmHg. Aortic valve peak gradient measures 4.0 mmHg. Aortic  valve area, by VTI measures 4.21  cm.   Pulmonic Valve: The pulmonic valve was not well visualized. Pulmonic valve  regurgitation is trivial.   Aorta: The aortic root and ascending aorta are structurally normal, with  no evidence of dilitation.   Venous: The inferior vena cava is normal in size with less than 50%  respiratory variability, suggesting right atrial pressure of 8 mmHg.   IAS/Shunts: The interatrial septum was not well visualized.   Echo: 02/16/21   1. Limited study for LV function   2. Linear LV apical filling defect, likely false tendon from the anterior  wall to apex - there is another filling defect at the inferoapical wall  which likely represents thrombus (seen as an echodensity on the  non-contrast image as well). Left  ventricular ejection fraction, by estimation, is 25 to 30%. The left  ventricle has severely decreased function. The left ventricle demonstrates  regional wall motion abnormalities (see scoring diagram/findings for  description). The left ventricular  internal cavity size was mildly dilated.   3. The mitral valve is grossly normal. Mild mitral valve regurgitation.   4. The aortic valve is tricuspid. Aortic valve regurgitation is not  visualized.   Comparison(s): Prior images reviewed side by side. The LVEF on 02/13/2021  apperas to me to be significantly reduced, around 25% with regional wall  motion abnormalities as described. The LVEF was reported higher, however  as 30-35%.  _____________   History of Present Illness     Lawrence [name] is a 55 y.o. male with history of DM, HTN and hyperlipidemia who began having chest pain last night around 10;30 pm. His pain  persisted through the night and felt slightly better this am. The pain recurred later in the morning and he went into the Urgent Care where he was referred to the [facility] ED. He has had persistent chest pain this afternoon. EKG with subtle anterior ST elevation with Q wave formation. Troponin over 5000. Still with chest pain in the ED mildly improved with morphine. Code STEMI called by Dr. Johnney Killian in the ED. Our team evaluated him in the ED with plans to take him to the cath lab for urgent cardiac catheterization given evidence of troponin elevation and ongoing chest pain. He was given ASA and IV heparin in the ED.   Hospital Course     STEMI: Underwent cardiac cath noted above with severe mLAD stenosis treated with PCI/DES x1, and severe OM stenosis treated with PCI/DES x1, non dominant RCA with mild non-obstructive disease. Started on DAPT with ASA/Brilinta for one year. Worked well with CR without recurrent chest pain.  -- continue on ASA/Brilinta, coreg 3.166m BID, losartan 292mdaily, spiro 12.34m37maily, statin    HTN: blood pressures somewhat soft, therefore preventing further titration of GDMT -- continue coreg 3.1234m24mD, losartan 234mg41mly, spiro 12.34mg d68my   ICM: EF noted at 30-35% with basal, anteroseptal hypokinesis. Does not appear  volume overloaded on exam. Repeat limited echo EF felt to be more consistent with 25%, also unable to rule out thrombus. Follow up cardiac MRI confirmed NO thrombus -- continue coreg 3.117m BID, losartan 241mdaily, spiro 12.76m26maily -- added Jardiance  -- met criteria for lifevest, which was placed prior to discharge   DM: Hgb a1c 7.7 -- treated with SSI while inpatient -- added jardiance, plan to resume metformin at DC   HLD: LDL 168 -- now on Crestor 81m70mily  -- FLP/LFTs in 8 weeks  Did the patient have an acute coronary syndrome (MI, NSTEMI, STEMI, etc) this admission?:  Yes                               AHA/ACC Clinical Performance &  Quality Measures: Aspirin prescribed? - Yes ADP Receptor Inhibitor (Plavix/Clopidogrel, Brilinta/Ticagrelor or Effient/Prasugrel) prescribed (includes medically managed patients)? - Yes Beta Blocker prescribed? - Yes High Intensity Statin (Lipitor 40-80mg27mCrestor 20-81mg)19mscribed? - Yes EF assessed during THIS hospitalization? - Yes For EF <40%, was ACEI/ARB prescribed? - Yes For EF <40%, Aldosterone Antagonist (Spironolactone or Eplerenone) prescribed? - Yes Cardiac Rehab Phase II ordered (including medically managed patients)? - Yes      _____________  Discharge Vitals Blood pressure 120/74, pulse 80, temperature 98.5 F (36.9 C), temperature source Oral, resp. rate (!) 21, height _0  (1.702 m), weight 88.8 kg, SpO2 99 %.  Filed Weights   02/12/21 1609 02/13/21 0400 02/16/21 0338  Weight: 88.5 kg 89 kg 88.8 kg    Labs & Radiologic Studies    CBC No results for input(s): WBC, NEUTROABS, HGB, HCT, MCV, PLT in the last 72 hours. Basic Metabolic Panel Recent Labs    02/16/21 0944 02/17/21 0134  NA 133* 133*  K 4.0 4.3  CL 104 103  CO2 23 24  GLUCOSE 181* 105*  BUN 22* 20  CREATININE 1.05 0.97  CALCIUM 9.0 8.8*   Liver Function Tests No results for input(s): AST, ALT, ALKPHOS, BILITOT, PROT, ALBUMIN in the last 72 hours. No results for input(s): LIPASE, AMYLASE in the last 72 hours. High Sensitivity Troponin:   Recent Labs  Lab 02/12/21 1628 02/12/21 1810  TROPONINIHS 5,060* 8,363*    BNP Invalid input(s): POCBNP D-Dimer No results for input(s): DDIMER in the last 72 hours. Hemoglobin A1C No results for input(s): HGBA1C in the last 72 hours. Fasting Lipid Panel No results for input(s): CHOL, HDL, LDLCALC, TRIG, CHOLHDL, LDLDIRECT in the last 72 hours. Thyroid Function Tests No results for input(s): TSH, T4TOTAL, T3FREE, THYROIDAB in the last 72 hours.  Invalid input(s): FREET3 _____________  DG Chest 2 View  Result Date: 02/12/2021 CLINICAL DATA:   Chest pain EXAM: CHEST - 2 VIEW COMPARISON:  Chest x-ray 03/01/2016 FINDINGS: There is some patchy and strandy opacities in both lung bases. There is no pleural effusion or pneumothorax. The cardiomediastinal silhouette is within normal limits. No acute fractures are seen. IMPRESSION: Minimal bibasilar infiltrates may represent atelectasis or infection. Electronically Signed   By: Amy  GRonney Asters  On: 02/12/2021 17:19   CARDIAC CATHETERIZATION  Result Date: 02/12/2021   Mid RCA-1 lesion is 30% stenosed.   Mid RCA-2 lesion is 30% stenosed.   Dist LAD lesion is 99% stenosed.   1st Diag lesion is 50% stenosed.   2nd Mrg lesion is 90% stenosed.   Mid LAD lesion is 99% stenosed.   A drug-eluting stent  was successfully placed using a [place] 2.25X30.   A drug-eluting stent was successfully placed using a STENT ONYX FRONTIER 3.0X18.   Post intervention, there is a 0% residual stenosis.   Post intervention, there is a 0% residual stenosis. Severe mid LAD stenosis. Severe apical LAD stenosis. Moderate disease in the small caliber first diagonal branch Successful PTCA/DES x 1 mid LAD Severe obtuse marginal stenosis. Successful PTCA/DES x 1 obtuse marginal 1. Non-dominant RCA with mild disease Elevated LVEDP Recommendations: Will admit to the ICU. Continue Aggrastat drip for 2 hours. Start high intensity statin and beta blocker. DAPT with ASA and Brilinta for 12 months. Echo in the am. Resume home Lantus. SSI coverage.   ECHOCARDIOGRAM COMPLETE  Result Date: 02/13/2021    ECHOCARDIOGRAM REPORT   Patient Name:   Lawrence [name] Date of Exam: 02/13/2021 Medical Rec #:  [id number]     Height:       67.0 in Accession #:    [id number]    Weight:       196.2 lb Date of Birth:  Feb 11, 1966      BSA:          2.006 m Patient Age:    18 years      BP:           109/68 mmHg Patient Gender: M             HR:           94 bpm. Exam Location:  Inpatient Procedure: 2D Echo, Cardiac Doppler and Color Doppler Indications:     CAD  History:        Patient has no prior history of Echocardiogram examinations.                 Acute MI and CAD; Risk Factors:Diabetes.  Sonographer:    MH Referring Phys: [place]  1. Left ventricular ejection fraction, by estimation, is 30 to 35%. The left ventricle has moderately decreased function. The left ventricle demonstrates regional wall motion abnormalities (see scoring diagram/findings for description). There is mild left ventricular hypertrophy. Left ventricular diastolic parameters are indeterminate.  2. Right ventricular systolic function is normal. The right ventricular size is normal. There is normal pulmonary artery systolic pressure.  3. The mitral valve is normal in structure. Trivial mitral valve regurgitation.  4. The aortic valve is tricuspid. Aortic valve regurgitation is not visualized. No aortic stenosis is present.  5. The inferior vena cava is normal in size with <50% respiratory variability, suggesting right atrial pressure of 8 mmHg. FINDINGS  Left Ventricle: Left ventricular ejection fraction, by estimation, is 30 to 35%. The left ventricle has moderately decreased function. The left ventricle demonstrates regional wall motion abnormalities. The left ventricular internal cavity size was normal in size. There is mild left ventricular hypertrophy. Left ventricular diastolic parameters are indeterminate.  LV Wall Scoring: The mid and distal anterior wall, mid and distal anterior septum, apical lateral segment, and apex are akinetic. The basal anteroseptal segment and basal anterior segment are hypokinetic. The antero-lateral wall, entire inferior wall, posterior wall, mid inferoseptal segment, and basal inferoseptal segment are normal. Right Ventricle: The right ventricular size is normal. No increase in right ventricular wall thickness. Right ventricular systolic function is normal. There is normal pulmonary artery systolic pressure. The tricuspid  regurgitant velocity is 1.60 m/s, and  with an assumed right atrial pressure of 8 mmHg, the estimated right ventricular systolic pressure is 16.6 mmHg.  Left Atrium: Left atrial size was normal in size. Right Atrium: Right atrial size was normal in size. Pericardium: There is no evidence of pericardial effusion. Mitral Valve: The mitral valve is normal in structure. Trivial mitral valve regurgitation. Tricuspid Valve: The tricuspid valve is normal in structure. Tricuspid valve regurgitation is trivial. Aortic Valve: The aortic valve is tricuspid. Aortic valve regurgitation is not visualized. No aortic stenosis is present. Aortic valve mean gradient measures 3.0 mmHg. Aortic valve peak gradient measures 4.0 mmHg. Aortic valve area, by VTI measures 4.21 cm. Pulmonic Valve: The pulmonic valve was not well visualized. Pulmonic valve regurgitation is trivial. Aorta: The aortic root and ascending aorta are structurally normal, with no evidence of dilitation. Venous: The inferior vena cava is normal in size with less than 50% respiratory variability, suggesting right atrial pressure of 8 mmHg. IAS/Shunts: The interatrial septum was not well visualized.  LEFT VENTRICLE PLAX 2D LVIDd:         5.40 cm     Diastology LVIDs:         4.90 cm     LV e' medial:    9.46 cm/s LV PW:         1.20 cm     LV E/e' medial:  7.6 LV IVS:        1.10 cm     LV e' lateral:   9.90 cm/s LVOT diam:     2.70 cm     LV E/e' lateral: 7.3 LV SV:         78 LV SV Index:   39 LVOT Area:     5.73 cm  LV Volumes (MOD) LV vol d, MOD A4C: 82.8 ml LV vol s, MOD A4C: 61.4 ml LV SV MOD A4C:     82.8 ml RIGHT VENTRICLE            IVC RV S prime:     7.07 cm/s  IVC diam: 2.10 cm TAPSE (M-mode): 2.5 cm LEFT ATRIUM             Index       RIGHT ATRIUM           Index LA diam:        3.30 cm 1.65 cm/m  RA Area:     17.20 cm LA Vol (A2C):   46.7 ml 23.28 ml/m RA Volume:   40.40 ml  20.14 ml/m LA Vol (A4C):   40.1 ml 19.99 ml/m LA Biplane Vol: 43.8 ml 21.84  ml/m  AORTIC VALVE                   PULMONIC VALVE AV Area (Vmax):    4.38 cm    PV Vmax:       0.50 m/s AV Area (Vmean):   3.86 cm    PV Peak grad:  1.0 mmHg AV Area (VTI):     4.21 cm AV Vmax:           100.00 cm/s AV Vmean:          81.200 cm/s AV VTI:            0.185 m AV Peak Grad:      4.0 mmHg AV Mean Grad:      3.0 mmHg LVOT Vmax:         76.50 cm/s LVOT Vmean:        54.800 cm/s LVOT VTI:          0.136 m LVOT/AV VTI  ratio: 0.74  AORTA Ao Root diam: 3.60 cm Ao Asc diam:  3.10 cm MITRAL VALVE               TRICUSPID VALVE MV Area (PHT): 7.86 cm    TR Peak grad:   10.2 mmHg MR Peak grad: 46.8 mmHg    TR Vmax:        160.00 cm/s MR Vmax:      342.00 cm/s MV E velocity: 72.00 cm/s  SHUNTS MV A velocity: 59.10 cm/s  Systemic VTI:  0.14 m MV E/A ratio:  1.22        Systemic Diam: 2.70 cm Oswaldo Milian MD Electronically signed by Oswaldo Milian MD Signature Date/Time: 02/13/2021/4:52:39 PM    Final    ECHOCARDIOGRAM LIMITED  Result Date: 02/16/2021    ECHOCARDIOGRAM LIMITED REPORT   Patient Name:   Lawrence [name] Date of Exam: 02/16/2021 Medical Rec #:  [id number]     Height:       67.0 in Accession #:    [id number]    Weight:       195.8 lb Date of Birth:  1966-04-18      BSA:          2.004 m Patient Age:    33 years      BP:           123/78 mmHg Patient Gender: M             HR:           83 bpm. Exam Location:  Inpatient Procedure: Limited Echo and Color Doppler Indications:    Reassess LVF  History:        Patient has prior history of Echocardiogram examinations, most                 recent 02/13/2021. Acute MI; Signs/Symptoms:Fever.  Sonographer:    Melissa Morford RDCS (AE, PE) Referring Phys: [place]  1. Limited study for LV function  2. Linear LV apical filling defect, likely false tendon from the anterior wall to apex - there is another filling defect at the inferoapical wall which likely represents thrombus (seen as an echodensity on the non-contrast image as  well). Left ventricular ejection fraction, by estimation, is 25 to 30%. The left ventricle has severely decreased function. The left ventricle demonstrates regional wall motion abnormalities (see scoring diagram/findings for description). The left ventricular internal cavity size was mildly dilated.  3. The mitral valve is grossly normal. Mild mitral valve regurgitation.  4. The aortic valve is tricuspid. Aortic valve regurgitation is not visualized. Comparison(s): Prior images reviewed side by side. The LVEF on 02/13/2021 apperas to me to be significantly reduced, around 25% with regional wall motion abnormalities as described. The LVEF was reported higher, however as 30-35%. Conclusion(s)/Recommendation(s): Critical findings reported to Harlan Stains, NP and acknowledged at 1:58 pm. FINDINGS  Left Ventricle: Linear LV apical filling defect, likely false tendon from the anterior wall to apex - there is another filling defect at the inferoapical wall which likely represents thrombus (seen as an echodensity on the non-contrast image as well). Left ventricular ejection fraction, by estimation, is 25 to 30%. The left ventricle has severely decreased function. The left ventricle demonstrates regional wall motion abnormalities. Definity contrast agent was given IV to delineate the left ventricular endocardial borders. The left ventricular internal cavity size was mildly dilated. There is no left ventricular hypertrophy.  LV Wall Scoring: The apex is dyskinetic.  The mid and distal anterior septum, inferior septum, mid and distal inferior wall, and posterior wall are akinetic. The antero-lateral wall, apical anterior segment, and basal inferior segment are hypokinetic. The anterior wall, basal anteroseptal segment, and apical lateral segment are normal. Mitral Valve: The mitral valve is grossly normal. Mild mitral valve regurgitation. Aortic Valve: The aortic valve is tricuspid. Aortic valve regurgitation is not  visualized. Pulmonic Valve: The pulmonic valve was grossly normal. Pulmonic valve regurgitation is trivial. LEFT VENTRICLE PLAX 2D LVIDd:         5.70 cm LVIDs:         4.70 cm LV PW:         0.90 cm LV IVS:        0.80 cm  LEFT ATRIUM         Index LA diam:    4.10 cm 2.05 cm/m   AORTA Ao Root diam: 2.60 cm [name] MD Electronically signed by [name] MD Signature Date/Time: 02/16/2021/2:02:12 PM    Final    Disposition   Pt is being discharged home today in good condition.  Follow-up Plans & Appointments     Follow-up Information     [facility]. Go on 02/24/2021.   Specialty: Cardiology Why: Tuesday 9/20 @ 12PM for HV TOC within [place].  Bring all your medications with you. FREE valey parking at [employer], off [address]. Contact information: [address] [id number] [place] [address] [place], [place], [place] Follow up on 03/11/2021.   Specialty: Cardiology Why: at 2:45pm for your follow up appt with Dr. Camillia Herter PA [facility] Contact information: [address] [place] [address] [phone]                Discharge Instructions     Amb Referral to Cardiac Rehabilitation   Complete by: As directed    Referring to [place] [facility] 2   Diagnosis:  Coronary Stents NSTEMI     After initial evaluation and assessments completed: Virtual Based Care may be provided alone or in conjunction with Phase 2 Cardiac Rehab based on patient barriers.: Yes   Call MD for:  difficulty breathing, headache or visual disturbances   Complete by: As directed    Call MD for:  persistant dizziness or light-headedness   Complete by: As directed    Call MD for:  redness, tenderness, or signs of infection (pain, swelling, redness, odor or green/yellow discharge around incision site)   Complete by: As directed    Diet - low sodium heart healthy   Complete by: As  directed    Discharge instructions   Complete by: As directed    Radial Site Care Refer to this sheet in the next few weeks. These instructions provide you with information on caring for yourself after your procedure. Your caregiver may also give you more specific instructions. Your treatment has been planned according to current medical practices, but problems sometimes occur. Call your caregiver if you have any problems or questions after your procedure. HOME CARE INSTRUCTIONS You may shower the day after the procedure. Remove the bandage (dressing) and gently wash the site with plain soap and water. Gently pat the site dry.  Do not apply powder or lotion to the site.  Do not submerge the affected site in water for 3 to 5 days.  Inspect the site at least twice daily.  Do not flex or bend the affected arm for 24 hours.  No lifting over 5 pounds (2.3 kg) for 5 days after your procedure.  Do not drive home if you are discharged the same day of the procedure. Have someone else drive you.  You may drive 24 hours after the procedure unless otherwise instructed by your caregiver.  What to expect: Any bruising will usually fade within 1 to 2 weeks.  Blood that collects in the tissue (hematoma) may be painful to the touch. It should usually decrease in size and tenderness within 1 to 2 weeks.  SEEK IMMEDIATE MEDICAL CARE IF: You have unusual pain at the radial site.  You have redness, warmth, swelling, or pain at the radial site.  You have drainage (other than a small amount of blood on the dressing).  You have chills.  You have a fever or persistent symptoms for more than 72 hours.  You have a fever and your symptoms suddenly get worse.  Your arm becomes pale, cool, tingly, or numb.  You have heavy bleeding from the site. Hold pressure on the site.   PLEASE DO NOT MISS ANY DOSES OF YOUR BRILINTA!!!!! Also keep a log of you blood pressures and bring back to your follow up appt. Please call the  office with any questions.   Patients taking blood thinners should generally stay away from medicines like ibuprofen, Advil, Motrin, naproxen, and Aleve due to risk of stomach bleeding. You may take Tylenol as directed or talk to your primary doctor about alternatives.  PLEASE ENSURE THAT YOU DO NOT RUN OUT OF YOUR BRILINTA. This medication is very important to remain on for at least one year. IF you have issues obtaining this medication due to cost please CALL the office 3-5 business days prior to running out in order to prevent missing doses of this medication.   Increase activity slowly   Complete by: As directed       Discharge Medications   Allergies as of 02/17/2021   No Known Allergies      Medication List     STOP taking these medications    acetaminophen 500 MG tablet Commonly known as: TYLENOL   calcium citrate 950 (200 Ca) MG tablet Commonly known as: CALCITRATE - dosed in mg elemental calcium   ciclopirox 8 % solution Commonly known as: PENLAC   docusate sodium 100 MG capsule Commonly known as: COLACE   insulin glargine 100 UNIT/ML injection Commonly known as: LANTUS   linaclotide 145 MCG Caps capsule Commonly known as: Linzess       TAKE these medications    Aspirin Low Dose 81 MG chewable tablet Generic drug: aspirin Chew 1 tablet (81 mg total) by mouth daily.   blood glucose meter kit and supplies Kit Dispense based on patient and insurance preference. Use up to four times daily as directed. (FOR ICD-9 250.00, 250.01).   Brilinta 90 MG Tabs tablet Generic drug: ticagrelor Take 1 tablet (90 mg total) by mouth 2 (two) times daily.   carvedilol 3.125 MG tablet Commonly known as: COREG Take 1 tablet (3.125 mg total) by mouth 2 (two) times daily with a meal.   FreeStyle Precision Neo Test test strip Generic drug: glucose blood USE 1 STRIP TO CHECK GLUCOSE ONCE DAILY   Jardiance 10 MG Tabs tablet Generic drug: empagliflozin Take 1 tablet (10  mg total) by mouth daily.   losartan 25 MG tablet Commonly known as: COZAAR Take 1 tablet (25 mg total)  by mouth daily.   metFORMIN 500 MG 24 hr tablet Commonly known as: GLUCOPHAGE-XR Take 1,000 mg by mouth 2 (two) times daily.   nitroGLYCERIN 0.4 MG SL tablet Commonly known as: NITROSTAT Place 1 tablet (0.4 mg total) under the tongue every 5 (five) minutes as needed for chest pain.   omega-3 acid ethyl esters 1 g capsule Commonly known as: LOVAZA Take 1 g by mouth in the morning, at noon, in the evening, and at bedtime.   rosuvastatin 40 MG tablet Commonly known as: CRESTOR Take 1 tablet (40 mg total) by mouth daily. What changed:  medication strength how much to take   spironolactone 25 MG tablet Commonly known as: ALDACTONE Take 1/2 tablets (12.5 mg total) by mouth daily.               Durable Medical Equipment  (From admission, onward)           Start     Ordered   02/16/21 1406  For home use only DME Vest life vest  Once       Comments: Length of need- 3 months   02/16/21 1406             Outstanding Labs/Studies   FLP/LFTs in 8 weeks  BMET at follow up  Echo in 3 months  Duration of Discharge Encounter   Greater than 30 minutes including physician time.  Signed, Reino Bellis, NP 02/17/2021, 1:47 PM  Agree with note by Reino Bellis NP-C  Lawrence [name]  was admitted with anterior STEMI.  He underwent PCI and stenting of his LAD and circumflex marginal branch by Dr. Angelena Form.  His RCA was free of significant disease.  Initially his EF by 2D echo was in the 35% range however subsequent echo several days later revealed this to be more in the 20 to 25% range.  He is completely asymptomatic.  We arranged for him to be fitted for a LifeVest which she will wear for 3 months during which time he will be on guideline directed optimal medical therapy and have a follow-up 2D echo after that for return of LV function.  There was a question of LV mural  thrombus however a cardiac MRI performed this morning did not confirm this and therefore he was deemed stable for discharge on dual antiplatelet therapy (aspirin/Brilinta).  He will follow-up in [facility] heart failure clinic after which she will see Dr. Angelena Form back.   Lorretta Harp, M.D., [place], [facility], [place] [address] [place], [place]  [id number]  [phone] 02/17/2021 2:06 PM

## 2021-02-16 NOTE — Progress Notes (Addendum)
Progress Note  Patient Name: Lawrence [name] Date of Encounter: 02/16/2021  [facility] Cardiologist: None   Subjective   Doing well this morning.   Inpatient Medications    Scheduled Meds:  aspirin  81 mg Oral Daily   carvedilol  3.125 mg Oral BID WC   Chlorhexidine Gluconate Cloth  6 each Topical Daily   docusate sodium  100 mg Oral BID   influenza vac split quadrivalent PF  0.5 mL Intramuscular Tomorrow-1000   insulin aspart  0-15 Units Subcutaneous TID WC   insulin glargine-yfgn  18 Units Subcutaneous QHS   linaclotide  145 mcg Oral QAC breakfast   losartan  25 mg Oral Daily   pneumococcal 23 valent vaccine  0.5 mL Intramuscular Tomorrow-1000   rosuvastatin  40 mg Oral Daily   sodium chloride flush  3 mL Intravenous Q12H   ticagrelor  90 mg Oral BID   Continuous Infusions:  sodium chloride     sodium chloride     PRN Meds: sodium chloride, acetaminophen, nitroGLYCERIN, ondansetron (ZOFRAN) IV, sodium chloride flush   Vital Signs    Vitals:   02/15/21 1950 02/16/21 0017 02/16/21 0338 02/16/21 0856  BP: 97/68 108/74 112/71 123/78  Pulse: 74 76 77 78  Resp: '19 11 12 18  '$ Temp: 98.3 F (36.8 C) 98.3 F (36.8 C) 98.6 F (37 C) 98.1 F (36.7 C)  TempSrc: Oral Oral Oral Oral  SpO2: 100% 96% 99% 99%  Weight:   88.8 kg   Height:        Intake/Output Summary (Last 24 hours) at 02/16/2021 1029 Last data filed at 02/15/2021 1600 Gross per 24 hour  Intake 480 ml  Output --  Net 480 ml   Last 3 Weights 02/16/2021 02/13/2021 02/12/2021  Weight (lbs) 195 lb 12.3 oz 196 lb 3.4 oz 195 lb  Weight (kg) 88.8 kg 89 kg 88.451 kg      Telemetry    SR - Personally Reviewed  ECG    No new tracing  Physical Exam   GEN: No acute distress.   Neck: No JVD Cardiac: RRR, no murmurs, rubs, or gallops.  Respiratory: Clear to auscultation bilaterally. GI: Soft, nontender, non-distended  MS: No edema; No deformity. Neuro:  Nonfocal  Psych: Normal affect   Labs     High Sensitivity Troponin:   Recent Labs  Lab 02/12/21 1628 02/12/21 1810  TROPONINIHS 5,060* 8,363*      Chemistry Recent Labs  Lab 02/12/21 1628 02/13/21 0158  NA 134* 135  K 5.0 3.9  CL 102 102  CO2 24 25  GLUCOSE 171* 137*  BUN 18 18  CREATININE 0.96 0.97  CALCIUM 9.0 8.9  GFRNONAA >60 >60  ANIONGAP 8 8     Hematology Recent Labs  Lab 02/12/21 1628 02/13/21 0158  WBC 11.4* 10.2  RBC 3.76* 3.69*  HGB 11.2* 11.3*  HCT 34.7* 32.9*  MCV 92.3 89.2  MCH 29.8 30.6  MCHC 32.3 34.3  RDW 13.0 13.0  PLT 165 163    BNPNo results for input(s): BNP, PROBNP in the last 168 hours.   DDimer No results for input(s): DDIMER in the last 168 hours.   Radiology    No results found.  Cardiac Studies   Cath: 02/12/21  Mid RCA-1 lesion is 30% stenosed.   Mid RCA-2 lesion is 30% stenosed.   Dist LAD lesion is 99% stenosed.   1st Diag lesion is 50% stenosed.   2nd Mrg lesion is 90% stenosed.  Mid LAD lesion is 99% stenosed.   A drug-eluting stent was successfully placed using a STENT ONYX FRONTIER 2.25X30.   A drug-eluting stent was successfully placed using a STENT ONYX FRONTIER 3.0X18.   Post intervention, there is a 0% residual stenosis.   Post intervention, there is a 0% residual stenosis.   Severe mid LAD stenosis. Severe apical LAD stenosis. Moderate disease in the small caliber first diagonal branch Successful PTCA/DES x 1 mid LAD Severe obtuse marginal stenosis.  Successful PTCA/DES x 1 obtuse marginal 1.  Non-dominant RCA with mild disease Elevated LVEDP   Recommendations: Will admit to the ICU. Continue Aggrastat drip for 2 hours. Start high intensity statin and beta blocker. DAPT with ASA and Brilinta for 12 months. Echo in the am. Resume home Lantus. SSI coverage.   Diagnostic Dominance: Left Intervention    Echo: 02/13/21  IMPRESSIONS     1. Left ventricular ejection fraction, by estimation, is 30 to 35%. The  left ventricle has moderately  decreased function. The left ventricle  demonstrates regional wall motion abnormalities (see scoring  diagram/findings for description). There is mild  left ventricular hypertrophy. Left ventricular diastolic parameters are  indeterminate.   2. Right ventricular systolic function is normal. The right ventricular  size is normal. There is normal pulmonary artery systolic pressure.   3. The mitral valve is normal in structure. Trivial mitral valve  regurgitation.   4. The aortic valve is tricuspid. Aortic valve regurgitation is not  visualized. No aortic stenosis is present.   5. The inferior vena cava is normal in size with <50% respiratory  variability, suggesting right atrial pressure of 8 mmHg.   FINDINGS   Left Ventricle: Left ventricular ejection fraction, by estimation, is 30  to 35%. The left ventricle has moderately decreased function. The left  ventricle demonstrates regional wall motion abnormalities. The left  ventricular internal cavity size was  normal in size. There is mild left ventricular hypertrophy. Left  ventricular diastolic parameters are indeterminate.      LV Wall Scoring:  The mid and distal anterior wall, mid and distal anterior septum, apical  lateral segment, and apex are akinetic. The basal anteroseptal segment and  basal anterior segment are hypokinetic. The antero-lateral wall, entire  inferior wall, posterior wall, mid inferoseptal segment, and basal  inferoseptal segment are normal.   Right Ventricle: The right ventricular size is normal. No increase in  right ventricular wall thickness. Right ventricular systolic function is  normal. There is normal pulmonary artery systolic pressure. The tricuspid  regurgitant velocity is 1.60 m/s, and   with an assumed right atrial pressure of 8 mmHg, the estimated right  ventricular systolic pressure is 123XX123 mmHg.   Left Atrium: Left atrial size was normal in size.   Right Atrium: Right atrial size was normal  in size.   Pericardium: There is no evidence of pericardial effusion.   Mitral Valve: The mitral valve is normal in structure. Trivial mitral  valve regurgitation.   Tricuspid Valve: The tricuspid valve is normal in structure. Tricuspid  valve regurgitation is trivial.   Aortic Valve: The aortic valve is tricuspid. Aortic valve regurgitation is  not visualized. No aortic stenosis is present. Aortic valve mean gradient  measures 3.0 mmHg. Aortic valve peak gradient measures 4.0 mmHg. Aortic  valve area, by VTI measures 4.21  cm.   Pulmonic Valve: The pulmonic valve was not well visualized. Pulmonic valve  regurgitation is trivial.   Aorta: The aortic root and ascending  aorta are structurally normal, with  no evidence of dilitation.   Venous: The inferior vena cava is normal in size with less than 50%  respiratory variability, suggesting right atrial pressure of 8 mmHg.   IAS/Shunts: The interatrial septum was not well visualized.   Patient Profile     55 y.o. male with PMH of DM, HTN, HLD who presented on 9/8 with chest pain and STEMI.   Assessment & Plan    STEMI: Underwent cardiac cath noted above with severe mLAD stenosis treated with PCI/DES x1, and severe OM stenosis treated with PCI/DES x1, non dominant RCA with mild non-obstructive disease. Started on DAPT with ASA/Brilinta for one year. Worked well with CR without recurrent chest pain.  -- continue on ASA/Brilinta, coreg 3.'125mg'$  BID, losartan '25mg'$  daily, spiro 12.'5mg'$  daily, statin   HTN: blood pressures somewhat soft, therefore preventing further titration of GDMT -- continue coreg 3.'125mg'$  BID, losartan '25mg'$  daily, spiro 12.'5mg'$  daily  ICM: EF noted at 30-35% with basal, anteroseptal hypokinesis. Does not appear volume overloaded on exam.  -- continue coreg 3.'125mg'$  BID, losartan '25mg'$  daily, spiro 12.'5mg'$  daily -- added Jardiance  -- will repeat echo today to determine need for lifevest prior to DC  DM: Hgb a1c  7.7 -- treated with SSI -- added jardiance, plan to resume metformin at DC  HLD: LDL 168 -- now on Crestor '40mg'$  daily  -- FLP/LFTs in 8 weeks  Will message TOC HF clinic for follow up at DC.   For questions or updates, please contact [facility] Please consult [email or web address] for contact info under        Signed, Reino Bellis, NP  02/16/2021, 10:29 AM    Agree with note by Reino Bellis NP-C  Patient admitted with anterior STEMI underwent PCI drug-eluting stenting of his LAD and circumflex obtuse marginal branch by Dr. Angelena Form.  His initial EF was 30 to 35% by 2D echo.  He is on dual antiplatelet therapy as well as guideline directed optimal medical therapy including Jardiance.  Vital signs are stable.  He said no recurrent symptoms.  We will recheck a 2D echo today and if his EF is greater than 35% he can be discharged home without a LifeVest.  Otherwise we will get [facility] to fit him and send him home tomorrow.  He will need TOC 7 in the heart failure clinic followed by return office visit with Dr. Angelena Form.  Lorretta Harp, M.D., [place], [facility], [place] [address] [place], [place]  [id number]  [phone] 02/16/2021 12:03 PM

## 2021-02-16 NOTE — Progress Notes (Signed)
Heart Failure Navigation Team Progress Note  PCP: Melony Overly, MD Primary Cardiologist: Estevan Ryder., MD (New) Admitted from: home with spouse  Past Medical History:  Diagnosis Date   Diabetes mellitus without complication ([place])    HTN (hypertension)    Hyperlipidemia     Social History   Socioeconomic History   Marital status: Married    Spouse name: Aadith Pridmore   Number of children: 3   Years of education: Not on file   Highest education level: High school graduate  Occupational History   Occupation: disability  Tobacco Use   Smoking status: Never   Smokeless tobacco: Never  Vaping Use   Vaping Use: Never used  Substance and Sexual Activity   Alcohol use: Not Currently   Drug use: No   Sexual activity: Yes  Other Topics Concern   Not on file  Social History Narrative   Not on file   Social Determinants of Health   Financial Resource Strain: High Risk   Difficulty of Paying Living Expenses: Hard  Food Insecurity: Food Insecurity Present   Worried About Running Out of Food in the Last Year: Sometimes true   Ran Out of Food in the Last Year: Never true  Transportation Needs: No Transportation Needs   Lack of Transportation (Medical): No   Lack of Transportation (Non-Medical): No  Physical Activity: Not on file  Stress: Not on file  Social Connections: Not on file     Heart & Vascular Transition of Care Clinic follow-up: Scheduled for 02/24/21 at 12pm.  Confirmed enrolled in [place] transportation.  Immediate social needs: transportation  CSW enrolled the patient in [place], MSW, [place] Social Worker

## 2021-02-16 NOTE — Progress Notes (Signed)
Life Vest order noted- spoke with [employer] liaison- Dorian Pod. Support documents faxed to [employer]. Dorian Pod to follow up for insurance approval.

## 2021-02-16 NOTE — Progress Notes (Signed)
    Echo: 02/16/21  IMPRESSIONS     1. Limited study for LV function   2. Linear LV apical filling defect, likely false tendon from the anterior  wall to apex - there is another filling defect at the inferoapical wall  which likely represents thrombus (seen as an echodensity on the  non-contrast image as well). Left  ventricular ejection fraction, by estimation, is 25 to 30%. The left  ventricle has severely decreased function. The left ventricle demonstrates  regional wall motion abnormalities (see scoring diagram/findings for  description). The left ventricular  internal cavity size was mildly dilated.   3. The mitral valve is grossly normal. Mild mitral valve regurgitation.   4. The aortic valve is tricuspid. Aortic valve regurgitation is not  visualized.   Comparison(s): Prior images reviewed side by side. The LVEF on 02/13/2021  apperas to me to be significantly reduced, around 25% with regional wall  motion abnormalities as described. The LVEF was reported higher, however  as 30-35%.   Conclusion(s)/Recommendation(s): Critical findings reported to Harlan Stains, NP and acknowledged at 1:58 pm.   FINDINGS   Left Ventricle: Linear LV apical filling defect, likely false tendon from  the anterior wall to apex - there is another filling defect at the  inferoapical wall which likely represents thrombus (seen as an echodensity  on the non-contrast image as well).  Left ventricular ejection fraction, by estimation, is 25 to 30%. The left  ventricle has severely decreased function. The left ventricle demonstrates  regional wall motion abnormalities. Definity contrast agent was given IV  to delineate the left  ventricular endocardial borders. The left ventricular internal cavity size  was mildly dilated. There is no left ventricular hypertrophy.      LV Wall Scoring:  The apex is dyskinetic. The mid and distal anterior septum, inferior  septum,  mid and distal inferior wall,  and posterior wall are akinetic. The  antero-lateral wall, apical anterior segment, and basal inferior segment  are  hypokinetic. The anterior wall, basal anteroseptal segment, and apical  lateral segment are normal.   Mitral Valve: The mitral valve is grossly normal. Mild mitral valve  regurgitation.   Aortic Valve: The aortic valve is tricuspid. Aortic valve regurgitation is  not visualized.   Pulmonic Valve: The pulmonic valve was grossly normal. Pulmonic valve  regurgitation is trivial.   LEFT VENTRICLE  PLAX 2D  LVIDd:         5.70 cm  LVIDs:         4.70 cm  LV PW:         0.90 cm  LV IVS:        0.80 cm      LEFT ATRIUM         Index  LA diam:    4.10 cm 2.05 cm/m      AORTA  Ao Root diam: 2.60 cm   [name] MD   In review of echo, discussed findings with attending. Will plan for cardiac MRI to definitively rule out thrombus.   Have also reached out to Lifevest rep to fit prior to DC.   SignedReino Bellis, NP-C 02/16/2021, 2:17 PM Pager: [phone]

## 2021-02-16 NOTE — Progress Notes (Signed)
Heart Failure Nurse Navigator Progress Note  PCP: Melony Overly, MD PCP-Cardiologist: Estevan Ryder., MD (NEW) Admission Diagnosis: STEMI Admitted from: home with spouse  Presentation:   Lawrence [name] presented with chest pain. Pt completed LHC. Pending cMRI. ECHO showed new drop in EF of 30-35%, possible need for LifeVest.  Pt and family interactive with interview process. States he does not qualify for food stamp assistance. Already on disability. Has family for transportation, would like to be enrolled in [facility] services for potential cardiac rehab.   ECHO/ LVEF: 30-35%  Clinical Course:  Past Medical History:  Diagnosis Date   Diabetes mellitus without complication (HCC)    HTN (hypertension)    Hyperlipidemia      Social History   Socioeconomic History   Marital status: Married    Spouse name: Dabid [place]   Number of children: 3   Years of education: Not on file   Highest education level: High school graduate  Occupational History   Occupation: disability  Tobacco Use   Smoking status: Never   Smokeless tobacco: Never  Vaping Use   Vaping Use: Never used  Substance and Sexual Activity   Alcohol use: Not Currently   Drug use: No   Sexual activity: Yes  Other Topics Concern   Not on file  Social History Narrative   Not on file   Social Determinants of Health   Financial Resource Strain: High Risk   Difficulty of Paying Living Expenses: Hard  Food Insecurity: Food Insecurity Present   Worried About Running Out of Food in the Last Year: Sometimes true   Ran Out of Food in the Last Year: Never true  Transportation Needs: No Transportation Needs   Lack of Transportation (Medical): No   Lack of Transportation (Non-Medical): No  Physical Activity: Not on file  Stress: Not on file  Social Connections: Not on file    High Risk Criteria for Readmission and/or Poor Patient Outcomes: Heart failure hospital admissions (last 6 months): 1  No Show  rate: 7% Difficult social situation: no Demonstrates medication adherence: yes Primary Language: English Literacy level: able to read/write and comprehend  Education Assessment and Provision:  Detailed education and instructions provided on heart failure disease management including the following:  Signs and symptoms of Heart Failure When to call the physician Importance of daily weights Low sodium diet Fluid restriction Medication management Anticipated future follow-up appointments  Patient education given on each of the above topics.  Patient acknowledges understanding via teach back method and acceptance of all instructions.  Education Materials:  "Living Better With Heart Failure" Booklet, HF zone tool, & Daily Weight Tracker Tool.  Patient has scale at home: yes Patient has pill box at home: yes   Barriers of Care:   -new dx  Considerations/Referrals:   Referral made to Heart Failure Pharmacist Stewardship: yes, to see at [place] for pt assistance  Referral made to Heart Failure CSW/NCM TOC: no Referral made to Heart & Vascular TOC clinic: yes, 9/20@ 12pm  Items for Follow-up on DC/TOC: -optimize -pt assistance/med cost -diet/fluid modifications   Pricilla Holm, MSN, RN Heart Failure Nurse Navigator [phone]

## 2021-02-17 ENCOUNTER — Inpatient Hospital Stay (HOSPITAL_COMMUNITY): Payer: PPO

## 2021-02-17 DIAGNOSIS — I255 Ischemic cardiomyopathy: Secondary | ICD-10-CM | POA: Diagnosis not present

## 2021-02-17 DIAGNOSIS — Z955 Presence of coronary angioplasty implant and graft: Secondary | ICD-10-CM

## 2021-02-17 DIAGNOSIS — E782 Mixed hyperlipidemia: Secondary | ICD-10-CM | POA: Diagnosis not present

## 2021-02-17 DIAGNOSIS — I10 Essential (primary) hypertension: Secondary | ICD-10-CM

## 2021-02-17 DIAGNOSIS — I2102 ST elevation (STEMI) myocardial infarction involving left anterior descending coronary artery: Secondary | ICD-10-CM | POA: Diagnosis not present

## 2021-02-17 DIAGNOSIS — I313 Pericardial effusion (noninflammatory): Secondary | ICD-10-CM

## 2021-02-17 LAB — BASIC METABOLIC PANEL
Anion gap: 6 (ref 5–15)
BUN: 20 mg/dL (ref 6–20)
CO2: 24 mmol/L (ref 22–32)
Calcium: 8.8 mg/dL — ABNORMAL LOW (ref 8.9–10.3)
Chloride: 103 mmol/L (ref 98–111)
Creatinine, Ser: 0.97 mg/dL (ref 0.61–1.24)
GFR, Estimated: >60 mL/min (ref >60)
Glucose, Bld: 105 mg/dL — ABNORMAL HIGH (ref 70–99)
Potassium: 4.3 mmol/L (ref 3.5–5.1)
Sodium: 133 mmol/L — ABNORMAL LOW (ref 135–145)

## 2021-02-17 LAB — GLUCOSE, CAPILLARY
Glucose-Capillary: 97 mg/dL (ref 70–99)
Glucose-Capillary: 142 mg/dL — ABNORMAL HIGH (ref 70–99)
Glucose-Capillary: 153 mg/dL — ABNORMAL HIGH (ref 70–99)

## 2021-02-17 MED ORDER — GADOBUTROL 1 MMOL/ML IV SOLN
9.0000 mL | Freq: Once | INTRAVENOUS | Status: AC | PRN
  Administered 2021-02-17: 9 mL via INTRAVENOUS

## 2021-02-17 NOTE — Progress Notes (Signed)
Progress Note  Patient Name: Lawrence [name] Date of Encounter: 02/17/2021  [facility] Cardiologist: Lauree Chandler, MD   Subjective   Doing well this morning.  Ambulating without symptoms.  Had cardiac MRI this morning.  Inpatient Medications    Scheduled Meds:  aspirin  81 mg Oral Daily   carvedilol  3.125 mg Oral BID WC   Chlorhexidine Gluconate Cloth  6 each Topical Daily   docusate sodium  100 mg Oral BID   empagliflozin  10 mg Oral Daily   influenza vac split quadrivalent PF  0.5 mL Intramuscular Tomorrow-1000   insulin aspart  0-15 Units Subcutaneous TID WC   insulin glargine-yfgn  18 Units Subcutaneous QHS   linaclotide  145 mcg Oral QAC breakfast   losartan  25 mg Oral Daily   pneumococcal 23 valent vaccine  0.5 mL Intramuscular Tomorrow-1000   rosuvastatin  40 mg Oral Daily   sodium chloride flush  3 mL Intravenous Q12H   spironolactone  12.5 mg Oral Daily   ticagrelor  90 mg Oral BID   Continuous Infusions:  sodium chloride     sodium chloride     PRN Meds: sodium chloride, acetaminophen, nitroGLYCERIN, ondansetron (ZOFRAN) IV, sodium chloride flush   Vital Signs    Vitals:   02/16/21 2040 02/17/21 0449 02/17/21 0800 02/17/21 0934  BP: 104/76 103/74 115/73 121/79  Pulse: 79 74 83 84  Resp: '20 20 16 '$ (!) 22  Temp: 99.3 F (37.4 C) 98.1 F (36.7 C) 98.2 F (36.8 C)   TempSrc: Oral Oral Oral   SpO2: 98% 97% 99%   Weight:      Height:       No intake or output data in the 24 hours ending 02/17/21 0956  Last 3 Weights 02/16/2021 02/13/2021 02/12/2021  Weight (lbs) 195 lb 12.3 oz 196 lb 3.4 oz 195 lb  Weight (kg) 88.8 kg 89 kg 88.451 kg      Telemetry    SR - Personally Reviewed  ECG    No new tracing  Physical Exam   GEN: No acute distress.   Neck: No JVD Cardiac: RRR, no murmurs, rubs, or gallops.  Respiratory: Clear to auscultation bilaterally. GI: Soft, nontender, non-distended  MS: No edema; No deformity. Neuro:  Nonfocal   Psych: Normal affect   Labs    High Sensitivity Troponin:   Recent Labs  Lab 02/12/21 1628 02/12/21 1810  TROPONINIHS 5,060* 8,363*      Chemistry Recent Labs  Lab 02/13/21 0158 02/16/21 0944 02/17/21 0134  NA 135 133* 133*  K 3.9 4.0 4.3  CL 102 104 103  CO2 '25 23 24  '$ GLUCOSE 137* 181* 105*  BUN 18 22* 20  CREATININE 0.97 1.05 0.97  CALCIUM 8.9 9.0 8.8*  GFRNONAA >60 >60 >60  ANIONGAP '8 6 6     '$ Hematology Recent Labs  Lab 02/12/21 1628 02/13/21 0158  WBC 11.4* 10.2  RBC 3.76* 3.69*  HGB 11.2* 11.3*  HCT 34.7* 32.9*  MCV 92.3 89.2  MCH 29.8 30.6  MCHC 32.3 34.3  RDW 13.0 13.0  PLT 165 163    BNPNo results for input(s): BNP, PROBNP in the last 168 hours.   DDimer No results for input(s): DDIMER in the last 168 hours.   Radiology    ECHOCARDIOGRAM LIMITED  Result Date: 02/16/2021    ECHOCARDIOGRAM LIMITED REPORT   Patient Name:   Lawrence [name] Date of Exam: 02/16/2021 Medical Rec #:  [id number]  Height:       67.0 in Accession #:    [id number]    Weight:       195.8 lb Date of Birth:  [birth year]      BSA:          2.004 m Patient Age:    55 years      BP:           123/78 mmHg Patient Gender: M             HR:           83 bpm. Exam Location:  Inpatient Procedure: Limited Echo and Color Doppler Indications:    Reassess LVF  History:        Patient has prior history of Echocardiogram examinations, most                 recent 02/13/2021. Acute MI; Signs/Symptoms:Fever.  Sonographer:    Melissa Morford RDCS (AE, PE) Referring Phys: [place]  1. Limited study for LV function  2. Linear LV apical filling defect, likely false tendon from the anterior wall to apex - there is another filling defect at the inferoapical wall which likely represents thrombus (seen as an echodensity on the non-contrast image as well). Left ventricular ejection fraction, by estimation, is 25 to 30%. The left ventricle has severely decreased function. The left  ventricle demonstrates regional wall motion abnormalities (see scoring diagram/findings for description). The left ventricular internal cavity size was mildly dilated.  3. The mitral valve is grossly normal. Mild mitral valve regurgitation.  4. The aortic valve is tricuspid. Aortic valve regurgitation is not visualized. Comparison(s): Prior images reviewed side by side. The LVEF on 02/13/2021 apperas to me to be significantly reduced, around 25% with regional wall motion abnormalities as described. The LVEF was reported higher, however as 30-35%. Conclusion(s)/Recommendation(s): Critical findings reported to Harlan Stains, NP and acknowledged at 1:58 pm. FINDINGS  Left Ventricle: Linear LV apical filling defect, likely false tendon from the anterior wall to apex - there is another filling defect at the inferoapical wall which likely represents thrombus (seen as an echodensity on the non-contrast image as well). Left ventricular ejection fraction, by estimation, is 25 to 30%. The left ventricle has severely decreased function. The left ventricle demonstrates regional wall motion abnormalities. Definity contrast agent was given IV to delineate the left ventricular endocardial borders. The left ventricular internal cavity size was mildly dilated. There is no left ventricular hypertrophy.  LV Wall Scoring: The apex is dyskinetic. The mid and distal anterior septum, inferior septum, mid and distal inferior wall, and posterior wall are akinetic. The antero-lateral wall, apical anterior segment, and basal inferior segment are hypokinetic. The anterior wall, basal anteroseptal segment, and apical lateral segment are normal. Mitral Valve: The mitral valve is grossly normal. Mild mitral valve regurgitation. Aortic Valve: The aortic valve is tricuspid. Aortic valve regurgitation is not visualized. Pulmonic Valve: The pulmonic valve was grossly normal. Pulmonic valve regurgitation is trivial. LEFT VENTRICLE PLAX 2D LVIDd:          5.70 cm LVIDs:         4.70 cm LV PW:         0.90 cm LV IVS:        0.80 cm  LEFT ATRIUM         Index LA diam:    4.10 cm 2.05 cm/m   AORTA Ao Root diam: 2.60 cm [name] MD Electronically signed  by [name] MD Signature Date/Time: 02/16/2021/2:02:12 PM    Final     Cardiac Studies   Cath: 02/12/21  Mid RCA-1 lesion is 30% stenosed.   Mid RCA-2 lesion is 30% stenosed.   Dist LAD lesion is 99% stenosed.   1st Diag lesion is 50% stenosed.   2nd Mrg lesion is 90% stenosed.   Mid LAD lesion is 99% stenosed.   A drug-eluting stent was successfully placed using a STENT ONYX FRONTIER 2.25X30.   A drug-eluting stent was successfully placed using a STENT ONYX FRONTIER 3.0X18.   Post intervention, there is a 0% residual stenosis.   Post intervention, there is a 0% residual stenosis.   Severe mid LAD stenosis. Severe apical LAD stenosis. Moderate disease in the small caliber first diagonal branch Successful PTCA/DES x 1 mid LAD Severe obtuse marginal stenosis.  Successful PTCA/DES x 1 obtuse marginal 1.  Non-dominant RCA with mild disease Elevated LVEDP   Recommendations: Will admit to the ICU. Continue Aggrastat drip for 2 hours. Start high intensity statin and beta blocker. DAPT with ASA and Brilinta for 12 months. Echo in the am. Resume home Lantus. SSI coverage.   Diagnostic Dominance: Left Intervention    Echo: 02/13/21  IMPRESSIONS     1. Left ventricular ejection fraction, by estimation, is 30 to 35%. The  left ventricle has moderately decreased function. The left ventricle  demonstrates regional wall motion abnormalities (see scoring  diagram/findings for description). There is mild  left ventricular hypertrophy. Left ventricular diastolic parameters are  indeterminate.   2. Right ventricular systolic function is normal. The right ventricular  size is normal. There is normal pulmonary artery systolic pressure.   3. The mitral valve is normal in structure. Trivial  mitral valve  regurgitation.   4. The aortic valve is tricuspid. Aortic valve regurgitation is not  visualized. No aortic stenosis is present.   5. The inferior vena cava is normal in size with <50% respiratory  variability, suggesting right atrial pressure of 8 mmHg.   FINDINGS   Left Ventricle: Left ventricular ejection fraction, by estimation, is 30  to 35%. The left ventricle has moderately decreased function. The left  ventricle demonstrates regional wall motion abnormalities. The left  ventricular internal cavity size was  normal in size. There is mild left ventricular hypertrophy. Left  ventricular diastolic parameters are indeterminate.      LV Wall Scoring:  The mid and distal anterior wall, mid and distal anterior septum, apical  lateral segment, and apex are akinetic. The basal anteroseptal segment and  basal anterior segment are hypokinetic. The antero-lateral wall, entire  inferior wall, posterior wall, mid inferoseptal segment, and basal  inferoseptal segment are normal.   Right Ventricle: The right ventricular size is normal. No increase in  right ventricular wall thickness. Right ventricular systolic function is  normal. There is normal pulmonary artery systolic pressure. The tricuspid  regurgitant velocity is 1.60 m/s, and   with an assumed right atrial pressure of 8 mmHg, the estimated right  ventricular systolic pressure is 123XX123 mmHg.   Left Atrium: Left atrial size was normal in size.   Right Atrium: Right atrial size was normal in size.   Pericardium: There is no evidence of pericardial effusion.   Mitral Valve: The mitral valve is normal in structure. Trivial mitral  valve regurgitation.   Tricuspid Valve: The tricuspid valve is normal in structure. Tricuspid  valve regurgitation is trivial.   Aortic Valve: The aortic valve is tricuspid.  Aortic valve regurgitation is  not visualized. No aortic stenosis is present. Aortic valve mean gradient  measures  3.0 mmHg. Aortic valve peak gradient measures 4.0 mmHg. Aortic  valve area, by VTI measures 4.21  cm.   Pulmonic Valve: The pulmonic valve was not well visualized. Pulmonic valve  regurgitation is trivial.   Aorta: The aortic root and ascending aorta are structurally normal, with  no evidence of dilitation.   Venous: The inferior vena cava is normal in size with less than 50%  respiratory variability, suggesting right atrial pressure of 8 mmHg.   IAS/Shunts: The interatrial septum was not well visualized.    2D echocardiogram (02/16/2021)  IMPRESSIONS     1. Limited study for LV function   2. Linear LV apical filling defect, likely false tendon from the anterior  wall to apex - there is another filling defect at the inferoapical wall  which likely represents thrombus (seen as an echodensity on the  non-contrast image as well). Left  ventricular ejection fraction, by estimation, is 25 to 30%. The left  ventricle has severely decreased function. The left ventricle demonstrates  regional wall motion abnormalities (see scoring diagram/findings for  description). The left ventricular  internal cavity size was mildly dilated.   3. The mitral valve is grossly normal. Mild mitral valve regurgitation.   4. The aortic valve is tricuspid. Aortic valve regurgitation is not  visualized.   Comparison(s): Prior images reviewed side by side. The LVEF on 02/13/2021  apperas to me to be significantly reduced, around 25% with regional wall  motion abnormalities as described. The LVEF was reported higher, however  as 30-35%.   Conclusion(s)/Recommendation(s): Critical findings reported to Harlan Stains, NP and acknowledged at 1:58 pm.  Patient Profile     55 y.o. male with PMH of DM, HTN, HLD who presented on 9/8 with chest pain and STEMI.   Assessment & Plan    STEMI: Underwent cardiac cath noted above with severe mLAD stenosis treated with PCI/DES x1, and severe OM stenosis treated  with PCI/DES x1, non dominant RCA with mild non-obstructive disease. Started on DAPT with ASA/Brilinta for one year. Worked well with CR without recurrent chest pain.  -- continue on ASA/Brilinta, coreg 3.'125mg'$  BID, losartan '25mg'$  daily, spiro 12.'5mg'$  daily, statin   HTN: blood pressures somewhat soft, therefore preventing further titration of GDMT -- continue coreg 3.'125mg'$  BID, losartan '25mg'$  daily, spiro 12.'5mg'$  daily  ICM: EF noted at 30-35% with basal, anteroseptal hypokinesis. Does not appear volume overloaded on exam.  -- continue coreg 3.'125mg'$  BID, losartan '25mg'$  daily, spiro 12.'5mg'$  daily -- added Jardiance  -- 2D echo was repeated yesterday revealing decline in EF to the 25% level.  There was a question of apical mural thrombus.  Cardiac MRI was performed this morning to further evaluate this.  If indeed he has an apical mural thrombus he will need transition from Brilinta to clopidogrel and initiation of Coumadin anticoagulation.  DM: Hgb a1c 7.7 -- treated with SSI -- added jardiance, plan to resume metformin at DC  HLD: LDL 168 -- now on Crestor '40mg'$  daily  -- FLP/LFTs in 8 weeks  Will message [facility] HF clinic for follow up at DC.   For questions or updates, please contact [place] Please consult [email or web address] for contact info under        Signed, Quay Burow, MD  02/17/2021, 9:56 AM

## 2021-02-17 NOTE — Progress Notes (Signed)
D/C instructions given to pt and wife. Medications reviewed, all questions answered. IV removed, clean and intact. Pt applied Life Vest prior to discharge.  Clyde Canterbury, RN

## 2021-02-17 NOTE — Progress Notes (Signed)
CARDIAC REHAB PHASE I   PRE:  Rate/Rhythm: 82 SR  BP:  Supine: 121/76  Sitting:  Standing:    SaO2: 99%RA  MODE:  Ambulation: 670 ft   POST:  Rate/Rhythm: 94 SR  BP:  Supine:   Sitting: 115/89  Standing:    SaO2: 99%RA 1018-1042 Pt walked 670 ft on RA with his cane with steady gait. Tolerated well. Likes to walk. No CP. To chair after walk.   [name], RN BSN  02/17/2021 10:41 AM

## 2021-02-18 LAB — CULTURE, BLOOD (ROUTINE X 2)
Culture: NO GROWTH
Culture: NO GROWTH
Special Requests: ADEQUATE
Special Requests: ADEQUATE

## 2021-02-20 ENCOUNTER — Telehealth (HOSPITAL_COMMUNITY): Payer: Self-pay

## 2021-02-20 NOTE — Telephone Encounter (Signed)
Per phase I cardiac rehab, fax cardiac rehab referral to [facility] cardiac rehab.

## 2021-02-23 ENCOUNTER — Telehealth (HOSPITAL_COMMUNITY): Payer: Self-pay

## 2021-02-23 DIAGNOSIS — Z79899 Other long term (current) drug therapy: Secondary | ICD-10-CM | POA: Diagnosis not present

## 2021-02-23 DIAGNOSIS — I255 Ischemic cardiomyopathy: Secondary | ICD-10-CM | POA: Diagnosis not present

## 2021-02-23 DIAGNOSIS — I10 Essential (primary) hypertension: Secondary | ICD-10-CM | POA: Diagnosis not present

## 2021-02-23 DIAGNOSIS — E785 Hyperlipidemia, unspecified: Secondary | ICD-10-CM | POA: Diagnosis not present

## 2021-02-23 DIAGNOSIS — E1169 Type 2 diabetes mellitus with other specified complication: Secondary | ICD-10-CM | POA: Diagnosis not present

## 2021-02-23 DIAGNOSIS — I214 Non-ST elevation (NSTEMI) myocardial infarction: Secondary | ICD-10-CM | POA: Diagnosis not present

## 2021-02-23 NOTE — Telephone Encounter (Signed)
Called to confirm Heart & Vascular Transitions of Care appointment at noon 9/20. Patient reminded to bring all medications and pill box organizer with them. Confirmed patient has transportation. Gave directions, instructed to utilize [facility] parking.Spoke with daughter, Verdis Frederickson. States pt is doing well with LifeVest.  Confirmed appointment prior to ending call.   Pricilla Holm, MSN, RN Heart Failure Nurse Navigator [phone]

## 2021-02-23 NOTE — Progress Notes (Signed)
HEART & VASCULAR TRANSITION OF CARE CONSULT NOTE     Referring Physician: Dr Gwenlyn Found  Primary Care: [facility]  Primary Cardiologist: Dr  Angelena Form  Ortho: Dr Ninfa Linden  HPI: Referred to clinic by Dr Gwenlyn Found for heart failure consultation.   [name] is a 55 year old with a history of chronic light knee pain from crush injury, DM,HTN, hyperlipidemia, CAD, recently diagnosed HFrEF.   Admitted 02/12/2021 with chest pain in the setting of STEMI. Had [place] with and underwent  Successful PTCA/DES x 1 mid LAD and Successful PTCA/DES x 1 obtuse marginal.  Echo showed EF 30-35%. CMRI with EF 31% and unable to assess viability due to acute MI. Discharged with Life Vest. Discharged on losartan, coreg, jardiance, and sprionolactone.    He presents with his wife.  Overall feeling fine. Ambulates with a cane. Mild SOB with exertion. Denies PND/Orthopnea. Denies chest pain. Appetite ok and has been eating low salt diet.  No fever or chills. Weight at home 183-184 pounds. Taking all medications.Has some trouble paying for medications.  Life Vest Interrogation: NO VT. Wear time > 23 hours. Average heart rate 74. Daily steps 2217.  Social: Lives with his wife and child. Disabled from previous knee injury. He does not drink alcohol. He does not smoke.   Cardiac Testing  Echo 02/13/2021 EF 30-35% RV normal   CMRI 02/2021 -No thrombus.LGE consistent with myocardial infarction in portion of the mid anterior/anteroseptal Unable to assess for viability in setting of acute MI (LGE overestimates final infarct size in setting of acute MI). EF 31%. Akinesis of mid anterior/anteroseptal walls, apical anterior/septal/inferior walls, and apex. RV normal.    LHC 02/2021   Mid RCA-1 lesion is 30% stenosed.   Mid RCA-2 lesion is 30% stenosed.   Dist LAD lesion is 99% stenosed.   1st Diag lesion is 50% stenosed.   2nd Mrg lesion is 90% stenosed.   Mid LAD lesion is 99% stenosed.   A drug-eluting stent was successfully placed  using a STENT ONYX FRONTIER 2.25X30.   A drug-eluting stent was successfully placed using a STENT ONYX FRONTIER 3.0X18.   Post intervention, there is a 0% residual stenosis.   Post intervention, there is a 0% residual stenosis.  Severe mid LAD stenosis. Severe apical LAD stenosis. Moderate disease in the small caliber first diagonal branch Successful PTCA/DES x 1 mid LAD Severe obtuse marginal stenosis.  Successful PTCA/DES x 1 obtuse marginal 1.  Non-dominant RCA with mild disease Elevated LVEDP Review of Systems: [y] = yes, [ ]  = no   General: Weight gain [ ] ; Weight loss [ ] ; Anorexia [ ] ; Fatigue [ ] ; Fever [ ] ; Chills [ ] ; Weakness [ ]   Cardiac: Chest pain/pressure [ ] ; Resting SOB [ ] ; Exertional SOB [ ] ; Orthopnea [ ] ; Pedal Edema [ ] ; Palpitations [ ] ; Syncope [ ] ; Presyncope [ ] ; Paroxysmal nocturnal dyspnea[ ]   Pulmonary: Cough [ ] ; Wheezing[ ] ; Hemoptysis[ ] ; Sputum [ ] ; Snoring [ ]   GI: Vomiting[ ] ; Dysphagia[ ] ; Melena[ ] ; Hematochezia [ ] ; Heartburn[ ] ; Abdominal pain [ ] ; Constipation [ ] ; Diarrhea [ ] ; BRBPR [ ]   GU: Hematuria[ ] ; Dysuria [ ] ; Nocturia[ ]   Vascular: Pain in legs with walking [ ] ; Pain in feet with lying flat [ ] ; Non-healing sores [ ] ; Stroke [ ] ; TIA [ ] ; Slurred speech [ ] ;  Neuro: Headaches[ ] ; Vertigo[ ] ; Seizures[ ] ; Paresthesias[ ] ;Blurred vision [ ] ; Diplopia [ ] ; Vision changes [ ]   Ortho/Skin: Arthritis [ ] ; Joint pain [ ] ; Muscle pain [ ] ; Joint swelling [ ] ; Back Pain [ ] ; Rash [ ]   Psych: Depression[ ] ; Anxiety[ ]   Heme: Bleeding problems [ ] ; Clotting disorders [ ] ; Anemia [ ]   Endocrine: Diabetes [ Y]; Thyroid dysfunction[ ]    Past Medical History:  Diagnosis Date   Diabetes mellitus without complication (HCC)    HTN (hypertension)    Hyperlipidemia     Current Outpatient Medications  Medication Sig Dispense Refill   aspirin 81 MG chewable tablet Chew 1 tablet (81 mg total) by mouth daily. 90 tablet 2   blood glucose meter kit and  supplies KIT Dispense based on patient and insurance preference. Use up to four times daily as directed. (FOR ICD-9 250.00, 250.01). 1 each 0   carvedilol (COREG) 3.125 MG tablet Take 1 tablet (3.125 mg total) by mouth 2 (two) times daily with a meal. 180 tablet 1   empagliflozin (JARDIANCE) 10 MG TABS tablet Take 1 tablet (10 mg total) by mouth daily. 90 tablet 0   FREESTYLE PRECISION NEO TEST test strip USE 1 STRIP TO CHECK GLUCOSE ONCE DAILY     losartan (COZAAR) 25 MG tablet Take 1 tablet (25 mg total) by mouth daily. 90 tablet 1   metFORMIN (GLUCOPHAGE-XR) 500 MG 24 hr tablet Take 1,000 mg by mouth 2 (two) times daily.     omega-3 acid ethyl esters (LOVAZA) 1 g capsule Take 1 g by mouth in the morning, at noon, in the evening, and at bedtime.     rosuvastatin (CRESTOR) 40 MG tablet Take 1 tablet (40 mg total) by mouth daily. 90 tablet 1   spironolactone (ALDACTONE) 25 MG tablet Take 1/2 tablets (12.5 mg total) by mouth daily. 45 tablet 1   ticagrelor (BRILINTA) 90 MG TABS tablet Take 1 tablet (90 mg total) by mouth 2 (two) times daily. 180 tablet 2   nitroGLYCERIN (NITROSTAT) 0.4 MG SL tablet Place 1 tablet (0.4 mg total) under the tongue every 5 (five) minutes as needed for chest pain. (Patient not taking: Reported on 02/24/2021) 25 tablet 2   No current facility-administered medications for this encounter.    No Known Allergies    Social History   Socioeconomic History   Marital status: Married    Spouse name: Lawrence [name]   Number of children: 3   Years of education: Not on file   Highest education level: High school graduate  Occupational History   Occupation: disability  Tobacco Use   Smoking status: Never   Smokeless tobacco: Never  Vaping Use   Vaping Use: Never used  Substance and Sexual Activity   Alcohol use: Not Currently   Drug use: No   Sexual activity: Yes  Other Topics Concern   Not on file  Social History Narrative   Not on file   Social Determinants of  Health   Financial Resource Strain: High Risk   Difficulty of Paying Living Expenses: Hard  Food Insecurity: Food Insecurity Present   Worried About Running Out of Food in the Last Year: Sometimes true   Ran Out of Food in the Last Year: Never true  Transportation Needs: No Transportation Needs   Lack of Transportation (Medical): No   Lack of Transportation (Non-Medical): No  Physical Activity: Not on file  Stress: Not on file  Social Connections: Not on file  Intimate Partner Violence: Not on file      Family History  Problem Relation Age of Onset  Cancer Mother    Cancer Father     Vitals:   02/24/21 1207  BP: 138/78  Pulse: 78  SpO2: 99%  Weight: 82.6 kg (182 lb)   Wt Readings from Last 3 Encounters:  02/24/21 82.6 kg (182 lb)  02/16/21 88.8 kg (195 lb 12.3 oz)  11/22/18 86.2 kg (190 lb)     PHYSICAL EXAM: General:  Walked in the clinic with a cane.  No respiratory difficulty HEENT: normal Neck: supple. JVP 6-7 . Carotids 2+ bilat; no bruits. No lymphadenopathy or thryomegaly appreciated. Cor: PMI nondisplaced. Regular rate & rhythm. No rubs, gallops or murmurs. Wearing Life Vest Lungs: clear Abdomen: soft, nontender, nondistended. No hepatosplenomegaly. No bruits or masses. Good bowel sounds. Extremities: no cyanosis, clubbing, rash, edema Neuro: alert & oriented x 3, cranial nerves grossly intact. moves all 4 extremities w/o difficulty. Affect pleasant.  ASSESSMENT & PLAN: HFrEF, ICM   Echo EF 30-35% .CMRI LGE consistent with myocardial infarction in portion of the mid anterior/anteroseptal Unable to assess for viability in setting of acute MI (LGE overestimates final infarct size in setting of acute MI). EF 31%. Akinesis of mid anterior/anteroseptal walls, apical anterior/septal/inferior walls, and apex. RV normal.  NYHA III. Volume status stable. Does not need diuretics.  -Continue current dose of carvedilol  - Stop losartan and start entresto 24-26 mg  twice a day  - Continue 12.5 mg spironolactone daily - Continue jardiance 10 mg daily. - Interrogated Life Vest- no events. Needs to continue Life Vest until repeat ECHO. We discussed if EF remains < 35% will need referral for ICD -Check BMET  - Discussed purpose of medications changes.  2. CAD -LHC -severe mLAD stenosis treated with PCI/DES x1, and severe OM stenosis treated with PCI/DES x1, non dominant RCA with mild non-obstructive disease.  - Will need DAPT with ASA/Brilinta for one year - No chest pain - He has been referred to cardiac rehab. - Check BMET   3. HTN  Stable. As above switching to entresto.    4.Hyperlipidemia -Continue statin.   5.DMII -Per PCP -Recent Hgb A1C 7.7    Referred to HFSW: No Refer to Pharmacy:  No Refer to Home Health:  No Refer to Advanced Heart Failure Clinic: Yes ---> Dr Aundra Dubin  Refer to General Cardiology: Shared.   Follow up in 2 weeks with pharmacy and 6 weeks with Dr Aundra Dubin.    NP-C  9:28 PM

## 2021-02-24 ENCOUNTER — Other Ambulatory Visit: Payer: Self-pay

## 2021-02-24 ENCOUNTER — Other Ambulatory Visit (HOSPITAL_COMMUNITY): Payer: Self-pay

## 2021-02-24 ENCOUNTER — Encounter (HOSPITAL_COMMUNITY): Payer: Self-pay

## 2021-02-24 ENCOUNTER — Ambulatory Visit (HOSPITAL_COMMUNITY)
Admission: RE | Admit: 2021-02-24 | Discharge: 2021-02-24 | Disposition: A | Discharge status: Home / Self Care | Payer: PPO | Source: Ambulatory Visit | Attending: Adult Health | Admitting: Adult Health

## 2021-02-24 VITALS — BP 138/78 | HR 78 | Wt 182.0 lb

## 2021-02-24 DIAGNOSIS — I10 Essential (primary) hypertension: Secondary | ICD-10-CM

## 2021-02-24 DIAGNOSIS — Z7982 Long term (current) use of aspirin: Secondary | ICD-10-CM | POA: Diagnosis not present

## 2021-02-24 DIAGNOSIS — Z7984 Long term (current) use of oral hypoglycemic drugs: Secondary | ICD-10-CM | POA: Diagnosis not present

## 2021-02-24 DIAGNOSIS — E119 Type 2 diabetes mellitus without complications: Secondary | ICD-10-CM | POA: Insufficient documentation

## 2021-02-24 DIAGNOSIS — Z79899 Other long term (current) drug therapy: Secondary | ICD-10-CM | POA: Insufficient documentation

## 2021-02-24 DIAGNOSIS — I255 Ischemic cardiomyopathy: Secondary | ICD-10-CM

## 2021-02-24 DIAGNOSIS — I2510 Atherosclerotic heart disease of native coronary artery without angina pectoris: Secondary | ICD-10-CM | POA: Diagnosis not present

## 2021-02-24 DIAGNOSIS — I219 Acute myocardial infarction, unspecified: Secondary | ICD-10-CM | POA: Insufficient documentation

## 2021-02-24 DIAGNOSIS — Z596 Low income: Secondary | ICD-10-CM | POA: Diagnosis not present

## 2021-02-24 DIAGNOSIS — I110 Hypertensive heart disease with heart failure: Secondary | ICD-10-CM | POA: Insufficient documentation

## 2021-02-24 DIAGNOSIS — Z955 Presence of coronary angioplasty implant and graft: Secondary | ICD-10-CM | POA: Insufficient documentation

## 2021-02-24 DIAGNOSIS — I5022 Chronic systolic (congestive) heart failure: Secondary | ICD-10-CM | POA: Diagnosis not present

## 2021-02-24 DIAGNOSIS — Z5941 Food insecurity: Secondary | ICD-10-CM | POA: Insufficient documentation

## 2021-02-24 DIAGNOSIS — E785 Hyperlipidemia, unspecified: Secondary | ICD-10-CM | POA: Insufficient documentation

## 2021-02-24 LAB — BASIC METABOLIC PANEL
Anion gap: 13 (ref 5–15)
BUN: 41 mg/dL — ABNORMAL HIGH (ref 6–20)
CO2: 22 mmol/L (ref 22–32)
Calcium: 9.6 mg/dL (ref 8.9–10.3)
Chloride: 97 mmol/L — ABNORMAL LOW (ref 98–111)
Creatinine, Ser: 1.89 mg/dL — ABNORMAL HIGH (ref 0.61–1.24)
GFR, Estimated: 41 mL/min — ABNORMAL LOW (ref >60)
Glucose, Bld: 130 mg/dL — ABNORMAL HIGH (ref 70–99)
Potassium: 5 mmol/L (ref 3.5–5.1)
Sodium: 132 mmol/L — ABNORMAL LOW (ref 135–145)

## 2021-02-24 LAB — CBC
HCT: 42.9 % (ref 39.0–52.0)
Hemoglobin: 13.5 g/dL (ref 13.0–17.0)
MCH: 30.1 pg (ref 26.0–34.0)
MCHC: 31.5 g/dL (ref 30.0–36.0)
MCV: 95.8 fL (ref 80.0–100.0)
Platelets: 281 10*3/uL (ref 150–400)
RBC: 4.48 MIL/uL (ref 4.22–5.81)
RDW: 12.2 % (ref 11.5–15.5)
WBC: 7.2 10*3/uL (ref 4.0–10.5)
nRBC: 0 % (ref 0.0–0.2)

## 2021-02-24 LAB — BRAIN NATRIURETIC PEPTIDE: B Natriuretic Peptide: 299 pg/mL — ABNORMAL HIGH (ref 0.0–100.0)

## 2021-02-24 MED ORDER — ENTRESTO 24-26 MG PO TABS
1.0000 | ORAL_TABLET | Freq: Two times a day (BID) | ORAL | 3 refills | Status: DC
  Filled 2021-02-24: qty 60, 30d supply, fill #0

## 2021-02-24 NOTE — Patient Instructions (Signed)
Stop Losartan  Start Entresto 24/26 mg Twice daily   Labs done today, we will call you for abnormal results  Do the following things EVERYDAY: Weigh yourself in the morning before breakfast. Write it down and keep it in a log. Take your medicines as prescribed Eat low salt foods--Limit salt (sodium) to 2000 mg per day.  Stay as active as you can everyday Limit all fluids for the day to less than 2 liters  Thank you for allowing [place] to provider your heart failure care after your recent hospitalization. Please follow-up with [facility] as scheduled on 03/11/21  You have been referred to our [facility], appointment 03/25/21 at 11:30 am  If you have any questions, issues, or concerns before your next appointment please call our office at [phone], opt. 2 and leave a message for the triage nurse.

## 2021-02-25 ENCOUNTER — Telehealth (HOSPITAL_COMMUNITY): Payer: Self-pay | Admitting: *Deleted

## 2021-02-25 DIAGNOSIS — I5022 Chronic systolic (congestive) heart failure: Secondary | ICD-10-CM

## 2021-02-25 NOTE — Telephone Encounter (Signed)
[place], RN  02/25/2021  2:57 PM EDT Back to Top    Spoke w/pt, he is aware, agreeable, and verbalized understanding, repeat lab sch for 9/29   Scarlette Calico, RN  02/24/2021  5:11 PM EDT     Unable to reach pt. No VM set up on home #, LM tcb on pt's mobile # as well as wife's mobile #. Darrick Grinder, NP aware   Shonna Chock, [place]  02/24/2021  4:47 PM EDT     lmtrc   Amy Estrella Deeds, NP  02/24/2021  4:46 PM EDT     Please call renal function elevated. Stop spiro and entresto. Please ask him not to start entresto. Stay off losartan. Repeat BMET in 7 days. CBC stable.

## 2021-03-03 ENCOUNTER — Telehealth (HOSPITAL_COMMUNITY): Payer: Self-pay | Admitting: Pharmacist

## 2021-03-03 ENCOUNTER — Other Ambulatory Visit (HOSPITAL_COMMUNITY): Payer: Self-pay

## 2021-03-03 DIAGNOSIS — I252 Old myocardial infarction: Secondary | ICD-10-CM | POA: Diagnosis not present

## 2021-03-03 DIAGNOSIS — E119 Type 2 diabetes mellitus without complications: Secondary | ICD-10-CM | POA: Diagnosis not present

## 2021-03-03 DIAGNOSIS — I255 Ischemic cardiomyopathy: Secondary | ICD-10-CM | POA: Diagnosis not present

## 2021-03-03 DIAGNOSIS — I10 Essential (primary) hypertension: Secondary | ICD-10-CM | POA: Diagnosis not present

## 2021-03-03 DIAGNOSIS — Z955 Presence of coronary angioplasty implant and graft: Secondary | ICD-10-CM | POA: Diagnosis not present

## 2021-03-03 DIAGNOSIS — E785 Hyperlipidemia, unspecified: Secondary | ICD-10-CM | POA: Diagnosis not present

## 2021-03-03 NOTE — Telephone Encounter (Signed)
Left message for Lawrence [name] to clarify his Jardiance dose.  Please see my previous telephone note for details.  Clarified with Darrick Grinder, NP and Audry Riles, PharmD that pt should be on Jardiance 10mg  (not 25mg ).

## 2021-03-03 NOTE — Telephone Encounter (Signed)
Pharmacy Transitions of Care Follow-up Telephone Call  Date of discharge: 02/17/21  Discharge Diagnosis: STEMI w/DESx2  How have you been since you were released from the hospital?  Overall well, improving but slowly  Medication changes made at discharge:  - START:  Aspirin Low Dose (aspirin)  Brilinta (ticagrelor)  carvedilol (COREG)  Jardiance (empagliflozin)  nitroGLYCERIN (NITROSTAT)   - STOPPED:  acetaminophen 500 MG tablet (TYLENOL)  calcium citrate 950 (200 Ca) MG tablet (CALCITRATE - dosed in mg elemental calcium)  ciclopirox 8 % solution (PENLAC)  docusate sodium 100 MG capsule (COLACE)  insulin glargine 100 UNIT/ML injection (LANTUS)  linaclotide 145 MCG Caps capsule (Linzess)   - CHANGED: rosuvastatin  At time of note, pt has seen cardiology for initial consult and Delene Loll was started (losartan d/c) on 02/24/21. Evening of 02/24/21, pt was notified by phone to stay off losartan, stop entresto and spironolactone, until labwork done in 7 days due to renal function.  Jardiance 10mg  or 25mg ?  Pt was on 25mg  prior to admission but had not had it filled since May 2022.  It was started as a new med (10mg ) upon discharge.  Pt also has some reduced renal function.  Will clarify dose with Darrick Grinder, NP.    Medication changes verified by the patient? Yes    Medication Accessibility:  Home Pharmacy: [facility] [address]   Was the patient provided with refills on discharged medications? yes   Have all prescriptions been transferred from [facility] to home pharmacy?  Yes  Is the patient able to afford medications? Pt has insurance Notable copays: Brilinta ($45), Jardiance ($45) Eligible patient assistance: PartD plan, coupon not eligible, may qualify for MAP if needed  Pt reports he completed paperwork at recent [facility] appt to determine if he qualifies for MAP.     Medication Review:   TICAGRELOR (BRILINTA) Ticagrelor 90 mg BID initiated on 02/17/21.  - Educated patient on  expected duration of therapy of aspirin 81mg  with ticagrelor x 1 year. Advised patient that dose of ticagrelor may be reduced after 1 year. Aspirin will likely be continued. - Discussed importance of taking medication around the same time every day, - Reviewed potential DDIs with patient - Advised patient of medications to avoid (NSAIDs, aspirin maintenance doses>100 mg daily) - Educated that Tylenol (acetaminophen) will be the preferred analgesic to prevent risk of bleeding  - Emphasized importance of monitoring for signs and symptoms of bleeding (abnormal bruising, prolonged bleeding, nose bleeds, bleeding from gums, discolored urine, black tarry stools)  - Educated patient to notify doctor if shortness of breath or abnormal heartbeat occur - Advised patient to alert all providers of antiplatelet therapy prior to starting a new medication or having a procedure    Follow-up Appointments:  PCP Hospital f/u appt confirmed?  None at this time  [facility] f/u appt confirmed? Yes, has completed initial follow-up on 9/20 with Amy Clegg at [facility]  Scheduled to see [facility] lab on 9/29 @ 11:30 And Cardiology office on 10/5 at 2:45 pm   If their condition worsens, is the pt aware to call PCP or go to the Emergency Dept.? yes  Referred to cardiac rehab.  Final Patient Assessment:  Pt was a pleasure to speak with and very appreciative of the call.  He has a good understanding of his medications and is taking them appropriately but also had several questions that we were able to answer.  His only complaint at this time is constipation.  I have recommended  docusate or miralax to start, then move to a laxative if needed.

## 2021-03-04 ENCOUNTER — Telehealth (HOSPITAL_COMMUNITY): Payer: Self-pay | Admitting: Pharmacy Technician

## 2021-03-04 NOTE — Telephone Encounter (Signed)
Advanced Heart Failure Patient Advocate Encounter  Sent in [facility] [place] [place] and [place] application via fax.  Will follow up.

## 2021-03-05 ENCOUNTER — Other Ambulatory Visit: Payer: Self-pay

## 2021-03-05 ENCOUNTER — Ambulatory Visit (HOSPITAL_COMMUNITY)
Admission: RE | Admit: 2021-03-05 | Discharge: 2021-03-05 | Disposition: A | Discharge status: Home / Self Care | Payer: PPO | Source: Ambulatory Visit | Attending: Internal Medicine | Admitting: Internal Medicine

## 2021-03-05 DIAGNOSIS — I5022 Chronic systolic (congestive) heart failure: Secondary | ICD-10-CM | POA: Diagnosis not present

## 2021-03-05 LAB — BASIC METABOLIC PANEL
Anion gap: 11 (ref 5–15)
BUN: 31 mg/dL — ABNORMAL HIGH (ref 6–20)
CO2: 24 mmol/L (ref 22–32)
Calcium: 9.5 mg/dL (ref 8.9–10.3)
Chloride: 101 mmol/L (ref 98–111)
Creatinine, Ser: 1.67 mg/dL — ABNORMAL HIGH (ref 0.61–1.24)
GFR, Estimated: 48 mL/min — ABNORMAL LOW (ref >60)
Glucose, Bld: 142 mg/dL — ABNORMAL HIGH (ref 70–99)
Potassium: 4.9 mmol/L (ref 3.5–5.1)
Sodium: 136 mmol/L (ref 135–145)

## 2021-03-09 DIAGNOSIS — I252 Old myocardial infarction: Secondary | ICD-10-CM | POA: Diagnosis not present

## 2021-03-09 DIAGNOSIS — Z955 Presence of coronary angioplasty implant and graft: Secondary | ICD-10-CM | POA: Diagnosis not present

## 2021-03-11 ENCOUNTER — Ambulatory Visit (INDEPENDENT_AMBULATORY_CARE_PROVIDER_SITE_OTHER): Payer: PPO | Admitting: Physician Assistant

## 2021-03-11 ENCOUNTER — Other Ambulatory Visit: Payer: Self-pay

## 2021-03-11 ENCOUNTER — Encounter: Payer: Self-pay | Admitting: Physician Assistant

## 2021-03-11 VITALS — BP 122/86 | HR 87 | Ht 68.0 in | Wt 178.0 lb

## 2021-03-11 DIAGNOSIS — E785 Hyperlipidemia, unspecified: Secondary | ICD-10-CM | POA: Diagnosis not present

## 2021-03-11 DIAGNOSIS — I5022 Chronic systolic (congestive) heart failure: Secondary | ICD-10-CM

## 2021-03-11 DIAGNOSIS — I10 Essential (primary) hypertension: Secondary | ICD-10-CM | POA: Diagnosis not present

## 2021-03-11 DIAGNOSIS — I255 Ischemic cardiomyopathy: Secondary | ICD-10-CM | POA: Diagnosis not present

## 2021-03-11 NOTE — Patient Instructions (Signed)
Medication Instructions:  Your physician recommends that you continue on your current medications as directed. Please refer to the Current Medication list given to you today.  *If you need a refill on your cardiac medications before your next appointment, please call your pharmacy*   Lab Work: 6 WEEKS:  FASTING LIPID & LFT  If you have labs (blood work) drawn today and your tests are completely normal, you will receive your results only by: [place] (if you have MyChart) OR A paper copy in the mail If you have any lab test that is abnormal or we need to change your treatment, we will call you to review the results.   Testing/Procedures: None ordered   Follow-Up: At [facility], you and your health needs are our priority.  As part of our continuing mission to provide you with exceptional heart care, we have created designated Provider Care Teams.  These Care Teams include your primary Cardiologist (physician) and Advanced Practice Providers (APPs -  Physician Assistants and Nurse Practitioners) who all work together to provide you with the care you need, when you need it.  We recommend signing up for the patient portal called "MyChart".  Sign up information is provided on this After Visit Summary.  MyChart is used to connect with patients for Virtual Visits (Telemedicine).  Patients are able to view lab/test results, encounter notes, upcoming appointments, etc.  Non-urgent messages can be sent to your provider as well.   To learn more about what you can do with MyChart, go to [email or web address].    Your next appointment:   4 month(s)  The format for your next appointment:   In Person  Provider:   You may see Lauree Chandler, MD or one of the following Advanced Practice Providers on your designated Care Team:   Melina Copa, PA-C Ermalinda Barrios, PA-C   Other Instructions

## 2021-03-11 NOTE — Progress Notes (Signed)
Cardiology Office Note:    Date:  03/11/2021   ID:  Lawrence [name], DOB August 14, 1965, MRN [id number]  PCP:  Melony Overly, MD  [facility] Cardiologist:  Lauree Chandler, MD  [facility] Electrophysiologist:  None   Chief Complaint: hospital follow up   History of Present Illness:    Lawrence [name] is a 55 y.o. male with a hx of hypertension, hyperlipidemia and diabetes mellitus who admitted September 2022 with anterior ST elevation.  Urgent cardiac cath showed severe mid LAD stenosis s/p PCI/DES x1 and severe OM stenosis s/p PCI/DES x1.  Other nonobstructive disease treated medically.  Plan for dual antiplatelet therapy with aspirin and Brilinta for 1 year.  He was found to have ischemic cardiomyopathy with EF of 30 to 35% with basal and anterior septal hypokinesis.  Repeat limited echo with EF of 25-30%.  Cardiac MRI did not showed thrombus.  Placed on LifeVest.  Started on Coreg, losartan, spironolactone and Jardiance.  Seen in heart failure clinic for follow-up.  ARB changed to Entresto. Now off entresto and spiro per patient due to elevated SCr and upper high potassium.   Here for follow up.  Patient reports he stopped his Entresto and spironolactone due to high potassium/kidney function.  Reviewed recent blood work.  Kidney function is improving.  Potassium at upper normal limit.  Patient denies chest pain, shortness of breath, orthopnea, PND, syncope, lower extremity edema or melena.  He is watching his diet. Marland Kitchen  He is doing cardiac rehab twice per week.  Past Medical History:  Diagnosis Date   Diabetes mellitus without complication ([place])    HTN (hypertension)    Hyperlipidemia     Past Surgical History:  Procedure Laterality Date   CORONARY STENT INTERVENTION N/A 02/12/2021   Procedure: CORONARY STENT INTERVENTION;  Surgeon: Burnell Blanks, MD;  Location: [place] [facility];  Service: Cardiovascular;  Laterality: N/A;   CORONARY/GRAFT ACUTE MI REVASCULARIZATION N/A  02/12/2021   Procedure: Coronary/Graft Acute MI Revascularization;  Surgeon: Burnell Blanks, MD;  Location: [place] [facility];  Service: Cardiovascular;  Laterality: N/A;   EXTERNAL FIXATION LEG Right 02/27/2016   Procedure: EXTERNAL FIXATION LEG;  Surgeon: Netta Cedars, MD;  Location: [place];  Service: Orthopedics;  Laterality: Right;  reduction and external fixation right tibial plateau fracture   EXTERNAL FIXATION REMOVAL Right 03/04/2016   Procedure: REMOVAL EXTERNAL FIXATION LEG;  Surgeon: Altamese [place], MD;  Location: [place];  Service: Orthopedics;  Laterality: Right;   HARVEST BONE GRAFT Left 03/04/2016   Procedure: Rimmed IM aspirate;  Surgeon: Altamese [place], MD;  Location: [place];  Service: Orthopedics;  Laterality: Left;   LEFT HEART CATH AND CORONARY ANGIOGRAPHY N/A 02/12/2021   Procedure: LEFT HEART CATH AND CORONARY ANGIOGRAPHY;  Surgeon: Burnell Blanks, MD;  Location: [place] [facility];  Service: Cardiovascular;  Laterality: N/A;   ORIF TIBIA PLATEAU Right 03/04/2016   Procedure: OPEN REDUCTION INTERNAL FIXATION (ORIF) TIBIAL PLATEAU;  Surgeon: Altamese Octavia, MD;  Location: [place];  Service: Orthopedics;  Laterality: Right;    Current Medications: Current Meds  Medication Sig   albuterol (VENTOLIN HFA) 108 (90 Base) MCG/ACT inhaler as needed.   aspirin 81 MG chewable tablet Chew 1 tablet (81 mg total) by mouth daily.   blood glucose meter kit and supplies KIT Dispense based on patient and insurance preference. Use up to four times daily as directed. (FOR ICD-9 250.00, 250.01).   carvedilol (COREG) 3.125 MG tablet Take 1  tablet (3.125 mg total) by mouth 2 (two) times daily with a meal.   empagliflozin (JARDIANCE) 10 MG TABS tablet Take 1 tablet (10 mg total) by mouth daily.   FREESTYLE PRECISION NEO TEST test strip USE 1 STRIP TO CHECK GLUCOSE ONCE DAILY   metFORMIN (GLUCOPHAGE-XR) 500 MG 24 hr tablet Take 1,000 mg by mouth 2 (two) times daily.   nitroGLYCERIN (NITROSTAT)  0.4 MG SL tablet Place 1 tablet (0.4 mg total) under the tongue every 5 (five) minutes as needed for chest pain.   omega-3 acid ethyl esters (LOVAZA) 1 g capsule Take 1 g by mouth in the morning, at noon, in the evening, and at bedtime.   rosuvastatin (CRESTOR) 40 MG tablet Take 1 tablet (40 mg total) by mouth daily.   ticagrelor (BRILINTA) 90 MG TABS tablet Take 1 tablet (90 mg total) by mouth 2 (two) times daily.     Allergies:   Patient has no known allergies.   Social History   Socioeconomic History   Marital status: Married    Spouse name: Huy Majid   Number of children: 3   Years of education: Not on file   Highest education level: High school graduate  Occupational History   Occupation: disability  Tobacco Use   Smoking status: Never   Smokeless tobacco: Never  Vaping Use   Vaping Use: Never used  Substance and Sexual Activity   Alcohol use: Not Currently   Drug use: No   Sexual activity: Yes  Other Topics Concern   Not on file  Social History Narrative   Not on file   Social Determinants of Health   Financial Resource Strain: High Risk   Difficulty of Paying Living Expenses: Hard  Food Insecurity: Food Insecurity Present   Worried About Running Out of Food in the Last Year: Sometimes true   Ran Out of Food in the Last Year: Never true  Transportation Needs: No Transportation Needs   Lack of Transportation (Medical): No   Lack of Transportation (Non-Medical): No  Physical Activity: Not on file  Stress: Not on file  Social Connections: Not on file     Family History: The patient's family history includes Cancer in his father and mother.    ROS:   Please see the history of present illness.    All other systems reviewed and are negative.   EKGs/Labs/Other Studies Reviewed:    The following studies were reviewed today:  Cath: 02/12/21   Mid RCA-1 lesion is 30% stenosed.   Mid RCA-2 lesion is 30% stenosed.   Dist LAD lesion is 99% stenosed.   1st Diag  lesion is 50% stenosed.   2nd Mrg lesion is 90% stenosed.   Mid LAD lesion is 99% stenosed.   A drug-eluting stent was successfully placed using a STENT ONYX FRONTIER 2.25X30.   A drug-eluting stent was successfully placed using a STENT ONYX FRONTIER 3.0X18.   Post intervention, there is a 0% residual stenosis.   Post intervention, there is a 0% residual stenosis.   Severe mid LAD stenosis. Severe apical LAD stenosis. Moderate disease in the small caliber first diagonal branch Successful PTCA/DES x 1 mid LAD Severe obtuse marginal stenosis.  Successful PTCA/DES x 1 obtuse marginal 1.  Non-dominant RCA with mild disease Elevated LVEDP   Recommendations: Will admit to the ICU. Continue Aggrastat drip for 2 hours. Start high intensity statin and beta blocker. DAPT with ASA and Brilinta for 12 months. Echo in the am. Resume home Lantus.  SSI coverage.    Diagnostic Dominance: Left Intervention     Echo: 02/13/21   IMPRESSIONS     1. Left ventricular ejection fraction, by estimation, is 30 to 35%. The  left ventricle has moderately decreased function. The left ventricle  demonstrates regional wall motion abnormalities (see scoring  diagram/findings for description). There is mild  left ventricular hypertrophy. Left ventricular diastolic parameters are  indeterminate.   2. Right ventricular systolic function is normal. The right ventricular  size is normal. There is normal pulmonary artery systolic pressure.   3. The mitral valve is normal in structure. Trivial mitral valve  regurgitation.   4. The aortic valve is tricuspid. Aortic valve regurgitation is not  visualized. No aortic stenosis is present.   5. The inferior vena cava is normal in size with <50% respiratory  variability, suggesting right atrial pressure of 8 mmHg.   FINDINGS   Left Ventricle: Left ventricular ejection fraction, by estimation, is 30  to 35%. The left ventricle has moderately decreased function. The left   ventricle demonstrates regional wall motion abnormalities. The left  ventricular internal cavity size was  normal in size. There is mild left ventricular hypertrophy. Left  ventricular diastolic parameters are indeterminate.      LV Wall Scoring:  The mid and distal anterior wall, mid and distal anterior septum, apical  lateral segment, and apex are akinetic. The basal anteroseptal segment and  basal anterior segment are hypokinetic. The antero-lateral wall, entire  inferior wall, posterior wall, mid inferoseptal segment, and basal  inferoseptal segment are normal.   Right Ventricle: The right ventricular size is normal. No increase in  right ventricular wall thickness. Right ventricular systolic function is  normal. There is normal pulmonary artery systolic pressure. The tricuspid  regurgitant velocity is 1.60 m/s, and   with an assumed right atrial pressure of 8 mmHg, the estimated right  ventricular systolic pressure is 35.7 mmHg.   Left Atrium: Left atrial size was normal in size.   Right Atrium: Right atrial size was normal in size.   Pericardium: There is no evidence of pericardial effusion.   Mitral Valve: The mitral valve is normal in structure. Trivial mitral  valve regurgitation.   Tricuspid Valve: The tricuspid valve is normal in structure. Tricuspid  valve regurgitation is trivial.   Aortic Valve: The aortic valve is tricuspid. Aortic valve regurgitation is  not visualized. No aortic stenosis is present. Aortic valve mean gradient  measures 3.0 mmHg. Aortic valve peak gradient measures 4.0 mmHg. Aortic  valve area, by VTI measures 4.21  cm.   Pulmonic Valve: The pulmonic valve was not well visualized. Pulmonic valve  regurgitation is trivial.   Aorta: The aortic root and ascending aorta are structurally normal, with  no evidence of dilitation.   Venous: The inferior vena cava is normal in size with less than 50%  respiratory variability, suggesting right  atrial pressure of 8 mmHg.   IAS/Shunts: The interatrial septum was not well visualized.    Echo: 02/16/21   1. Limited study for LV function   2. Linear LV apical filling defect, likely false tendon from the anterior  wall to apex - there is another filling defect at the inferoapical wall  which likely represents thrombus (seen as an echodensity on the  non-contrast image as well). Left  ventricular ejection fraction, by estimation, is 25 to 30%. The left  ventricle has severely decreased function. The left ventricle demonstrates  regional wall motion abnormalities (see scoring  diagram/findings for  description). The left ventricular  internal cavity size was mildly dilated.   3. The mitral valve is grossly normal. Mild mitral valve regurgitation.   4. The aortic valve is tricuspid. Aortic valve regurgitation is not  visualized.   Comparison(s): Prior images reviewed side by side. The LVEF on 02/13/2021  apperas to me to be significantly reduced, around 25% with regional wall  motion abnormalities as described. The LVEF was reported higher, however  as 30-35%.   EKG:  EKG is  ordered today.  The ekg ordered today demonstrates SR TWI in lateral leads   Recent Labs: 02/24/2021: B Natriuretic Peptide 299.0; Hemoglobin 13.5; Platelets 281 03/05/2021: BUN 31; Creatinine, Ser 1.67; Potassium 4.9; Sodium 136  Recent Lipid Panel    Component Value Date/Time   CHOL 221 (H) 02/12/2021 1817   TRIG 121 02/12/2021 1817   HDL 29 (L) 02/12/2021 1817   CHOLHDL 7.6 02/12/2021 1817   VLDL 24 02/12/2021 1817   LDLCALC 168 (H) 02/12/2021 1817    Physical Exam:    VS:  BP 122/86   Pulse 87   Ht 5' 8"  (1.727 m)   Wt 178 lb (80.7 kg)   SpO2 98%   BMI 27.06 kg/m     Wt Readings from Last 3 Encounters:  03/11/21 178 lb (80.7 kg)  02/24/21 182 lb (82.6 kg)  02/16/21 195 lb 12.3 oz (88.8 kg)     GEN:  Well nourished, well developed in no acute distress HEENT: Normal NECK: No JVD; No  carotid bruits LYMPHATICS: No lymphadenopathy CARDIAC: RRR, no murmurs, rubs, gallops RESPIRATORY:  Clear to auscultation without rales, wheezing or rhonchi  ABDOMEN: Soft, non-tender, non-distended MUSCULOSKELETAL:  No edema; No deformity  SKIN: Warm and dry NEUROLOGIC:  Alert and oriented x 3 PSYCHIATRIC:  Normal affect   ASSESSMENT AND PLAN:    CAD No angina.  Continue cardiac rehab.  Continue aspirin and Brilinta.  Continue carvedilol and Crestor.  2. ICM - LVEF 25-30%. No LV thrombus on cMRI -Patient no longer on Entresto or spironolactone due to renal function/upper normal potassium.  He does not want to repeat blood work today.  He would like to follow-up at heart failure clinic during office visit in upcoming weeks. -Continue Jardiance  3. HTN -Blood pressure stable on current medication  4. HLD -02/12/2021: Cholesterol 221; HDL 29; LDL Cholesterol 168; Triglycerides 121; VLDL 24  -Continue high intensity statin -Lipid panel and LFTs in 6-week  5. DM -Continue metformin and Jardiance  Medication Adjustments/Labs and Tests Ordered: Current medicines are reviewed at length with the patient today.  Concerns regarding medicines are outlined above.  Orders Placed This Encounter  Procedures   Lipid panel   Hepatic function panel   EKG 12-Lead   No orders of the defined types were placed in this encounter.   Patient Instructions  Medication Instructions:  Your physician recommends that you continue on your current medications as directed. Please refer to the Current Medication list given to you today.  *If you need a refill on your cardiac medications before your next appointment, please call your pharmacy*   Lab Work: 6 WEEKS:  FASTING LIPID & LFT  If you have labs (blood work) drawn today and your tests are completely normal, you will receive your results only by: [place] (if you have MyChart) OR A paper copy in the mail If you have any lab test that  is abnormal or we need to change your treatment, we  will call you to review the results.   Testing/Procedures: None ordered   Follow-Up: At [facility], you and your health needs are our priority.  As part of our continuing mission to provide you with exceptional heart care, we have created designated Provider Care Teams.  These Care Teams include your primary Cardiologist (physician) and Advanced Practice Providers (APPs -  Physician Assistants and Nurse Practitioners) who all work together to provide you with the care you need, when you need it.  We recommend signing up for the patient portal called "MyChart".  Sign up information is provided on this After Visit Summary.  MyChart is used to connect with patients for Virtual Visits (Telemedicine).  Patients are able to view lab/test results, encounter notes, upcoming appointments, etc.  Non-urgent messages can be sent to your provider as well.   To learn more about what you can do with MyChart, go to [email or web address].    Your next appointment:   4 month(s)  The format for your next appointment:   In Person  Provider:   You may see Lauree Chandler, MD or one of the following Advanced Practice Providers on your designated Care Team:   Melina Copa, PA-C Ermalinda Barrios, PA-C   Other Instructions    Signed, Leanor Kail, [place]  03/11/2021 3:47 PM    [place]

## 2021-03-18 DIAGNOSIS — H35371 Puckering of macula, right eye: Secondary | ICD-10-CM | POA: Diagnosis not present

## 2021-03-18 DIAGNOSIS — E113513 Type 2 diabetes mellitus with proliferative diabetic retinopathy with macular edema, bilateral: Secondary | ICD-10-CM | POA: Diagnosis not present

## 2021-03-18 DIAGNOSIS — H01003 Unspecified blepharitis right eye, unspecified eyelid: Secondary | ICD-10-CM | POA: Diagnosis not present

## 2021-03-18 DIAGNOSIS — E113511 Type 2 diabetes mellitus with proliferative diabetic retinopathy with macular edema, right eye: Secondary | ICD-10-CM | POA: Diagnosis not present

## 2021-03-19 DIAGNOSIS — I252 Old myocardial infarction: Secondary | ICD-10-CM | POA: Diagnosis not present

## 2021-03-19 DIAGNOSIS — I420 Dilated cardiomyopathy: Secondary | ICD-10-CM | POA: Diagnosis not present

## 2021-03-24 NOTE — Progress Notes (Signed)
ADVANCED HF CLINIC CONSULT NOTE  Referring Provider: Darrick Grinder, NP Primary Care: Melony Overly, MD Primary Cardiologist: Dr  Angelena Form  Ortho: Dr Ninfa Linden HF Cardiologist: Dr. Aundra Dubin   HPI:   [name] is a 55 y.o. Hispanic male with a history of chronic light knee pain from crush injury, DM,HTN, hyperlipidemia, CAD, recently diagnosed HFrEF.    Admitted 02/12/2021 with chest pain in the setting of STEMI. Had [place] with and underwent  Successful PTCA/DES x 1 mid LAD and Successful PTCA/DES x 1 obtuse marginal.  Echo showed EF 30-35%. CMRI with EF 31% and unable to assess viability due to acute MI. Discharged with Life Vest. Discharged on losartan, coreg, jardiance, and sprionolactone.     Follow up in [facility] clinic 02/24/21 and Entresto started. Elevated SCr and upper/normal K on follow up labs and Entresto and spiro subsequently stopped.  Today he returns for HF follow up. He feels good, participating in CR 2x/week. He does not have significant exertional dyspnea. He walks 1/2 hour on days he does not go to CR. Denies CP, dizziness, edema, or PND/Orthopnea. Appetite ok. No fever or chills. Weight at home 178 pounds. Taking all medications. He has a brother on HD and is worried about taking medications that will hurt his kidneys.   Life Vest Interrogation: No VT. Wear time > 23 hours. Average heart rate 79. Daily steps 4410.   Social: Lives with his wife and child. Disabled from previous knee injury. He does not drink alcohol. He does not smoke.    Cardiac Testing  - Echo 02/13/2021 EF 30-35% RV normal.   - cMRI 02/2021 -No thrombus.LGE consistent with myocardial infarction in portion of the mid anterior/anteroseptal Unable to assess for viability in setting of acute MI (LGE overestimates final infarct size in setting of acute MI). EF 31%. Akinesis of mid anterior/anteroseptal walls, apical anterior/septal/inferior walls, and apex. RV normal.    - LHC 02/2021   Mid RCA-1 lesion is 30%  stenosed.   Mid RCA-2 lesion is 30% stenosed.   Dist LAD lesion is 99% stenosed.   1st Diag lesion is 50% stenosed.   2nd Mrg lesion is 90% stenosed.   Mid LAD lesion is 99% stenosed.   A drug-eluting stent was successfully placed using a STENT ONYX FRONTIER 2.25X30.   A drug-eluting stent was successfully placed using a STENT ONYX FRONTIER 3.0X18.   Post intervention, there is a 0% residual stenosis.   Post intervention, there is a 0% residual stenosis.  Severe mid LAD stenosis. Severe apical LAD stenosis. Moderate disease in the small caliber first diagonal branch Successful PTCA/DES x 1 mid LAD Severe obtuse marginal stenosis.  Successful PTCA/DES x 1 obtuse marginal 1.  Non-dominant RCA with mild disease Elevated LVEDP  Review of Systems: [y] = yes, [ ]  = no   General: Weight gain [ ] ; Weight loss [ ] ; Anorexia [ ] ; Fatigue [ ] ; Fever [ ] ; Chills [ ] ; Weakness [ ]   Cardiac: Chest pain/pressure [ ] ; Resting SOB [ ] ; Exertional SOB [ ] ; Orthopnea [ ] ; Pedal Edema [ ] ; Palpitations [ ] ; Syncope [ ] ; Presyncope [ ] ; Paroxysmal nocturnal dyspnea[ ]   Pulmonary: Cough [ ] ; Wheezing[ ] ; Hemoptysis[ ] ; Sputum [ ] ; Snoring [ ]   GI: Vomiting[ ] ; Dysphagia[ ] ; Melena[ ] ; Hematochezia [ ] ; Heartburn[ ] ; Abdominal pain [ ] ; Constipation [ ] ; Diarrhea [ ] ; BRBPR [ ]   GU: Hematuria[ ] ; Dysuria [ ] ; Nocturia[ ]   Vascular: Pain in legs  with walking [ ] ; Pain in feet with lying flat [ ] ; Non-healing sores [ ] ; Stroke [ ] ; TIA [ ] ; Slurred speech [ ] ;  Neuro: Headaches[ ] ; Vertigo[ ] ; Seizures[ ] ; Paresthesias[ ] ;Blurred vision [ ] ; Diplopia [ ] ; Vision changes [ ]   Ortho/Skin: Arthritis [ ] ; Joint pain [ ] ; Muscle pain [ ] ; Joint swelling [ ] ; Back Pain [ ] ; Rash [ ]   Psych: Depression[ ] ; Anxiety[ ]   Heme: Bleeding problems [ ] ; Clotting disorders [ ] ; Anemia [ ]   Endocrine: Diabetes Blue.Reese ]; Thyroid dysfunction[ ]   Past Medical History:  Diagnosis Date   Diabetes mellitus without complication (HCC)     HTN (hypertension)    Hyperlipidemia    Current Outpatient Medications  Medication Sig Dispense Refill   albuterol (VENTOLIN HFA) 108 (90 Base) MCG/ACT inhaler as needed.     aspirin 81 MG chewable tablet Chew 1 tablet (81 mg total) by mouth daily. 90 tablet 2   blood glucose meter kit and supplies KIT Dispense based on patient and insurance preference. Use up to four times daily as directed. (FOR ICD-9 250.00, 250.01). 1 each 0   carvedilol (COREG) 3.125 MG tablet Take 1 tablet (3.125 mg total) by mouth 2 (two) times daily with a meal. 180 tablet 1   empagliflozin (JARDIANCE) 10 MG TABS tablet Take 1 tablet (10 mg total) by mouth daily. 90 tablet 0   FREESTYLE PRECISION NEO TEST test strip USE 1 STRIP TO CHECK GLUCOSE ONCE DAILY     metFORMIN (GLUCOPHAGE-XR) 500 MG 24 hr tablet Take 1,000 mg by mouth 2 (two) times daily.     nitroGLYCERIN (NITROSTAT) 0.4 MG SL tablet Place 1 tablet (0.4 mg total) under the tongue every 5 (five) minutes as needed for chest pain. 25 tablet 2   omega-3 acid ethyl esters (LOVAZA) 1 g capsule Take 1 g by mouth in the morning, at noon, in the evening, and at bedtime.     rosuvastatin (CRESTOR) 40 MG tablet Take 1 tablet (40 mg total) by mouth daily. 90 tablet 1   ticagrelor (BRILINTA) 90 MG TABS tablet Take 1 tablet (90 mg total) by mouth 2 (two) times daily. 180 tablet 2   No current facility-administered medications for this encounter.   No Known Allergies  Social History   Socioeconomic History   Marital status: Married    Spouse name: Danis Pembleton   Number of children: 3   Years of education: Not on file   Highest education level: High school graduate  Occupational History   Occupation: disability  Tobacco Use   Smoking status: Never   Smokeless tobacco: Never  Vaping Use   Vaping Use: Never used  Substance and Sexual Activity   Alcohol use: Not Currently   Drug use: No   Sexual activity: Yes  Other Topics Concern   Not on file  Social  History Narrative   Not on file   Social Determinants of Health   Financial Resource Strain: High Risk   Difficulty of Paying Living Expenses: Hard  Food Insecurity: Food Insecurity Present   Worried About Running Out of Food in the Last Year: Sometimes true   Ran Out of Food in the Last Year: Never true  Transportation Needs: No Transportation Needs   Lack of Transportation (Medical): No   Lack of Transportation (Non-Medical): No  Physical Activity: Not on file  Stress: Not on file  Social Connections: Not on file  Intimate Partner Violence: Not on file  Family History  Problem Relation Age of Onset   Cancer Mother    Cancer Father    BP 138/78   Pulse 83   Wt 81.2 kg (179 lb)   SpO2 99%   BMI 27.22 kg/m   Wt Readings from Last 3 Encounters:  03/25/21 81.2 kg (179 lb)  03/11/21 80.7 kg (178 lb)  02/24/21 82.6 kg (182 lb)   PHYSICAL EXAM: General:  NAD. No resp difficulty, walked into clinic with cane. HEENT: Normal Neck: Supple. No JVD. Carotids 2+ bilat; no bruits. No lymphadenopathy or thryomegaly appreciated. Cor: PMI nondisplaced. Regular rate & rhythm. No rubs, gallops or murmurs. Lungs: Clear Abdomen: Soft, nontender, nondistended. No hepatosplenomegaly. No bruits or masses. Good bowel sounds. Extremities: No cyanosis, clubbing, rash, edema Neuro: Alert & oriented x 3, cranial nerves grossly intact. Moves all 4 extremities w/o difficulty. Affect pleasant.  ASSESSMENT & PLAN: 1. HFrEF, ICM:  Echo EF 30-35% .CMRI LGE consistent with myocardial infarction in portion of the mid anterior/anteroseptal. Unable to assess for viability in setting of acute MI (LGE overestimates final infarct  size in setting of acute MI). EF 31%. Akinesis of mid anterior/anteroseptal walls, apical anterior/septal/inferior walls, and apex. RV normal. NYHA II. Volume status stable. Does not need diuretics. Has been off Entresto and spiro with elevated SCr. - Re-trial Entresto 24/26 mg bid.  BMET today, repeat in 1 week.  - Off spiro with elevated SCr/K (9/22). - Continue carvedilol 3.125 mg bid. - Continue Jardiance 10 mg daily. - Interrogated Life Vest- no events. Needs to continue Life Vest until repeat ECHO. - We discussed if EF remains < 35% will need referral for ICD - Discussed purpose of medications changes.   2. CAD: LHC -severe mLAD stenosis treated with PCI/DES x1, and severe OM stenosis treated with PCI/DES x1, non dominant RCA with mild non-obstructive disease.  - Will need DAPT with ASA/Brilinta for one year. - No chest pain. - Continue statin. - Continue Cardiac Rehab.  3. HTN: Stable. Add Entresto as above.   4.Hyperlipidemia: Continue statin. Repeat Lipid panel in 6-8 weeks.   5.DMII: Per PCP - Recent Hgb A1C 7.7. - Continue Jardiance. No GU symptoms.   Follow up with Dr. Aundra Dubin + echo in 4 weeks as scheduled.  Allena Katz, FNP-BC 03/25/21

## 2021-03-25 ENCOUNTER — Ambulatory Visit (HOSPITAL_COMMUNITY)
Admission: RE | Admit: 2021-03-25 | Discharge: 2021-03-25 | Disposition: A | Discharge status: Home / Self Care | Payer: PPO | Source: Ambulatory Visit | Attending: Family Medicine | Admitting: Family Medicine

## 2021-03-25 ENCOUNTER — Encounter (HOSPITAL_COMMUNITY): Payer: Self-pay

## 2021-03-25 ENCOUNTER — Other Ambulatory Visit: Payer: Self-pay

## 2021-03-25 VITALS — BP 138/78 | HR 83 | Wt 179.0 lb

## 2021-03-25 DIAGNOSIS — I2510 Atherosclerotic heart disease of native coronary artery without angina pectoris: Secondary | ICD-10-CM | POA: Diagnosis not present

## 2021-03-25 DIAGNOSIS — Z7984 Long term (current) use of oral hypoglycemic drugs: Secondary | ICD-10-CM | POA: Insufficient documentation

## 2021-03-25 DIAGNOSIS — Z955 Presence of coronary angioplasty implant and graft: Secondary | ICD-10-CM | POA: Diagnosis not present

## 2021-03-25 DIAGNOSIS — I5022 Chronic systolic (congestive) heart failure: Secondary | ICD-10-CM | POA: Insufficient documentation

## 2021-03-25 DIAGNOSIS — I10 Essential (primary) hypertension: Secondary | ICD-10-CM | POA: Diagnosis not present

## 2021-03-25 DIAGNOSIS — Z7901 Long term (current) use of anticoagulants: Secondary | ICD-10-CM | POA: Diagnosis not present

## 2021-03-25 DIAGNOSIS — Z79899 Other long term (current) drug therapy: Secondary | ICD-10-CM | POA: Insufficient documentation

## 2021-03-25 DIAGNOSIS — E785 Hyperlipidemia, unspecified: Secondary | ICD-10-CM | POA: Diagnosis not present

## 2021-03-25 DIAGNOSIS — Z7982 Long term (current) use of aspirin: Secondary | ICD-10-CM | POA: Insufficient documentation

## 2021-03-25 DIAGNOSIS — I110 Hypertensive heart disease with heart failure: Secondary | ICD-10-CM | POA: Diagnosis not present

## 2021-03-25 DIAGNOSIS — E119 Type 2 diabetes mellitus without complications: Secondary | ICD-10-CM | POA: Diagnosis not present

## 2021-03-25 DIAGNOSIS — I252 Old myocardial infarction: Secondary | ICD-10-CM | POA: Diagnosis not present

## 2021-03-25 LAB — BASIC METABOLIC PANEL
Anion gap: 6 (ref 5–15)
BUN: 24 mg/dL — ABNORMAL HIGH (ref 6–20)
CO2: 27 mmol/L (ref 22–32)
Calcium: 9.5 mg/dL (ref 8.9–10.3)
Chloride: 102 mmol/L (ref 98–111)
Creatinine, Ser: 1.13 mg/dL (ref 0.61–1.24)
GFR, Estimated: >60 mL/min (ref >60)
Glucose, Bld: 138 mg/dL — ABNORMAL HIGH (ref 70–99)
Potassium: 4.7 mmol/L (ref 3.5–5.1)
Sodium: 135 mmol/L (ref 135–145)

## 2021-03-25 MED ORDER — ENTRESTO 49-51 MG PO TABS: 1.0000 | ORAL_TABLET | Freq: Two times a day (BID) | ORAL | 6 refills | Status: DC

## 2021-03-25 NOTE — Patient Instructions (Signed)
START Entresto 24/26 mg, one tab twice a day  Labs today We will only contact you if something comes back abnormal or we need to make some changes. Otherwise no news is good news!  Labs needed in 7-10 days  Keep follow up as needed  Do the following things EVERYDAY: Weigh yourself in the morning before breakfast. Write it down and keep it in a log. Take your medicines as prescribed Eat low salt foods--Limit salt (sodium) to 2000 mg per day.  Stay as active as you can everyday Limit all fluids for the day to less than 2 liters

## 2021-03-25 NOTE — Progress Notes (Signed)
Medication Samples have been provided to the patient.  Drug name: entresto       Strength: 24/26mg         Qty: 28  LOT: [id number]  Exp.Date: 03/2023  Dosing instructions: ON TAB TWICE A DAY  The patient has been instructed regarding the correct time, dose, and frequency of taking this medication, including desired effects and most common side effects.   Garlan Fair M 12:08 PM 03/25/2021

## 2021-03-30 ENCOUNTER — Other Ambulatory Visit (HOSPITAL_COMMUNITY): Payer: Self-pay

## 2021-04-03 ENCOUNTER — Other Ambulatory Visit: Payer: Self-pay

## 2021-04-03 ENCOUNTER — Ambulatory Visit (HOSPITAL_COMMUNITY)
Admission: RE | Admit: 2021-04-03 | Discharge: 2021-04-03 | Disposition: A | Discharge status: Home / Self Care | Payer: PPO | Source: Ambulatory Visit | Attending: Cardiology | Admitting: Cardiology

## 2021-04-03 DIAGNOSIS — I5022 Chronic systolic (congestive) heart failure: Secondary | ICD-10-CM

## 2021-04-03 LAB — BASIC METABOLIC PANEL
Anion gap: 7 (ref 5–15)
BUN: 22 mg/dL — ABNORMAL HIGH (ref 6–20)
CO2: 27 mmol/L (ref 22–32)
Calcium: 9.5 mg/dL (ref 8.9–10.3)
Chloride: 104 mmol/L (ref 98–111)
Creatinine, Ser: 0.99 mg/dL (ref 0.61–1.24)
GFR, Estimated: >60 mL/min (ref >60)
Glucose, Bld: 148 mg/dL — ABNORMAL HIGH (ref 70–99)
Potassium: 4.6 mmol/L (ref 3.5–5.1)
Sodium: 138 mmol/L (ref 135–145)

## 2021-04-03 NOTE — Telephone Encounter (Signed)
Advanced Heart Failure Patient Advocate Encounter   Have been working with [employer] to understand that the patient is a Medicare participant and is NOT commercially insured. This information has been sent twice now. Called and spoke with a representative and now have an approval.   Patient was approved to receive Brilinta from [employer]  Patient ID: [id number] Effective dates: 04/03/21 through 06/06/22  Called [employer] to check the status of the patient's application. Representative stated that there was clarification needed on the application. That was sent in.  Called [employer] to check the status of the patient's application. Representative stated that they need the patient's POI.   Called and left the patient a message detailing all the above information.

## 2021-04-07 DIAGNOSIS — I10 Essential (primary) hypertension: Secondary | ICD-10-CM | POA: Diagnosis not present

## 2021-04-07 DIAGNOSIS — I252 Old myocardial infarction: Secondary | ICD-10-CM | POA: Diagnosis not present

## 2021-04-07 DIAGNOSIS — E785 Hyperlipidemia, unspecified: Secondary | ICD-10-CM | POA: Diagnosis not present

## 2021-04-07 DIAGNOSIS — E1169 Type 2 diabetes mellitus with other specified complication: Secondary | ICD-10-CM | POA: Diagnosis not present

## 2021-04-07 DIAGNOSIS — Z6827 Body mass index (BMI) 27.0-27.9, adult: Secondary | ICD-10-CM | POA: Diagnosis not present

## 2021-04-07 DIAGNOSIS — I255 Ischemic cardiomyopathy: Secondary | ICD-10-CM | POA: Diagnosis not present

## 2021-04-10 DIAGNOSIS — Z955 Presence of coronary angioplasty implant and graft: Secondary | ICD-10-CM | POA: Diagnosis not present

## 2021-04-10 DIAGNOSIS — I252 Old myocardial infarction: Secondary | ICD-10-CM | POA: Diagnosis not present

## 2021-04-13 NOTE — Telephone Encounter (Signed)
Error

## 2021-04-19 DIAGNOSIS — I252 Old myocardial infarction: Secondary | ICD-10-CM | POA: Diagnosis not present

## 2021-04-19 DIAGNOSIS — I420 Dilated cardiomyopathy: Secondary | ICD-10-CM | POA: Diagnosis not present

## 2021-04-22 ENCOUNTER — Other Ambulatory Visit: Payer: PPO | Admitting: *Deleted

## 2021-04-22 ENCOUNTER — Other Ambulatory Visit: Payer: Self-pay

## 2021-04-22 DIAGNOSIS — I255 Ischemic cardiomyopathy: Secondary | ICD-10-CM | POA: Diagnosis not present

## 2021-04-22 DIAGNOSIS — I5022 Chronic systolic (congestive) heart failure: Secondary | ICD-10-CM | POA: Diagnosis not present

## 2021-04-22 DIAGNOSIS — I10 Essential (primary) hypertension: Secondary | ICD-10-CM

## 2021-04-22 LAB — LIPID PANEL
Chol/HDL Ratio: 5 ratio (ref 0.0–5.0)
Cholesterol, Total: 135 mg/dL (ref 100–199)
HDL: 27 mg/dL — ABNORMAL LOW (ref >39)
LDL Chol Calc (NIH): 76 mg/dL (ref 0–99)
Triglycerides: 186 mg/dL — ABNORMAL HIGH (ref 0–149)
VLDL Cholesterol Cal: 32 mg/dL (ref 5–40)

## 2021-04-22 LAB — HEPATIC FUNCTION PANEL
ALT: 12 IU/L (ref 0–44)
AST: 13 IU/L (ref 0–40)
Albumin: 4.9 g/dL (ref 3.8–4.9)
Alkaline Phosphatase: 104 IU/L (ref 44–121)
Bilirubin Total: 0.3 mg/dL (ref 0.0–1.2)
Bilirubin, Direct: 0.11 mg/dL (ref 0.00–0.40)
Total Protein: 7.4 g/dL (ref 6.0–8.5)

## 2021-04-29 DIAGNOSIS — E113513 Type 2 diabetes mellitus with proliferative diabetic retinopathy with macular edema, bilateral: Secondary | ICD-10-CM | POA: Diagnosis not present

## 2021-04-29 DIAGNOSIS — H35371 Puckering of macula, right eye: Secondary | ICD-10-CM | POA: Diagnosis not present

## 2021-04-29 DIAGNOSIS — E113511 Type 2 diabetes mellitus with proliferative diabetic retinopathy with macular edema, right eye: Secondary | ICD-10-CM | POA: Diagnosis not present

## 2021-05-07 DIAGNOSIS — I255 Ischemic cardiomyopathy: Secondary | ICD-10-CM | POA: Diagnosis not present

## 2021-05-07 DIAGNOSIS — E785 Hyperlipidemia, unspecified: Secondary | ICD-10-CM | POA: Diagnosis not present

## 2021-05-07 DIAGNOSIS — Z6827 Body mass index (BMI) 27.0-27.9, adult: Secondary | ICD-10-CM | POA: Diagnosis not present

## 2021-05-07 DIAGNOSIS — K5909 Other constipation: Secondary | ICD-10-CM | POA: Diagnosis not present

## 2021-05-07 DIAGNOSIS — I252 Old myocardial infarction: Secondary | ICD-10-CM | POA: Diagnosis not present

## 2021-05-07 DIAGNOSIS — I10 Essential (primary) hypertension: Secondary | ICD-10-CM | POA: Diagnosis not present

## 2021-05-07 DIAGNOSIS — E1169 Type 2 diabetes mellitus with other specified complication: Secondary | ICD-10-CM | POA: Diagnosis not present

## 2021-05-08 DIAGNOSIS — Z955 Presence of coronary angioplasty implant and graft: Secondary | ICD-10-CM | POA: Diagnosis not present

## 2021-05-08 DIAGNOSIS — I252 Old myocardial infarction: Secondary | ICD-10-CM | POA: Diagnosis not present

## 2021-05-19 ENCOUNTER — Ambulatory Visit (HOSPITAL_COMMUNITY)
Admission: RE | Admit: 2021-05-19 | Discharge: 2021-05-19 | Disposition: A | Discharge status: Home / Self Care | Payer: PPO | Source: Ambulatory Visit | Attending: Family Medicine | Admitting: Family Medicine

## 2021-05-19 ENCOUNTER — Other Ambulatory Visit: Payer: Self-pay

## 2021-05-19 ENCOUNTER — Ambulatory Visit (HOSPITAL_BASED_OUTPATIENT_CLINIC_OR_DEPARTMENT_OTHER)
Admission: RE | Admit: 2021-05-19 | Discharge: 2021-05-19 | Disposition: A | Discharge status: Home / Self Care | Payer: PPO | Source: Ambulatory Visit | Attending: Cardiology | Admitting: Cardiology

## 2021-05-19 ENCOUNTER — Encounter (HOSPITAL_COMMUNITY): Payer: Self-pay | Admitting: Cardiology

## 2021-05-19 VITALS — BP 108/68 | HR 84 | Wt 177.8 lb

## 2021-05-19 DIAGNOSIS — I5022 Chronic systolic (congestive) heart failure: Secondary | ICD-10-CM | POA: Diagnosis not present

## 2021-05-19 DIAGNOSIS — I110 Hypertensive heart disease with heart failure: Secondary | ICD-10-CM | POA: Diagnosis not present

## 2021-05-19 DIAGNOSIS — E785 Hyperlipidemia, unspecified: Secondary | ICD-10-CM | POA: Insufficient documentation

## 2021-05-19 DIAGNOSIS — I255 Ischemic cardiomyopathy: Secondary | ICD-10-CM

## 2021-05-19 DIAGNOSIS — Z79899 Other long term (current) drug therapy: Secondary | ICD-10-CM | POA: Insufficient documentation

## 2021-05-19 DIAGNOSIS — I2510 Atherosclerotic heart disease of native coronary artery without angina pectoris: Secondary | ICD-10-CM | POA: Diagnosis not present

## 2021-05-19 DIAGNOSIS — E119 Type 2 diabetes mellitus without complications: Secondary | ICD-10-CM | POA: Insufficient documentation

## 2021-05-19 DIAGNOSIS — Z7984 Long term (current) use of oral hypoglycemic drugs: Secondary | ICD-10-CM | POA: Insufficient documentation

## 2021-05-19 DIAGNOSIS — I420 Dilated cardiomyopathy: Secondary | ICD-10-CM | POA: Diagnosis not present

## 2021-05-19 DIAGNOSIS — I252 Old myocardial infarction: Secondary | ICD-10-CM | POA: Insufficient documentation

## 2021-05-19 DIAGNOSIS — Z7982 Long term (current) use of aspirin: Secondary | ICD-10-CM | POA: Insufficient documentation

## 2021-05-19 LAB — BASIC METABOLIC PANEL
Anion gap: 8 (ref 5–15)
BUN: 19 mg/dL (ref 6–20)
CO2: 27 mmol/L (ref 22–32)
Calcium: 9.5 mg/dL (ref 8.9–10.3)
Chloride: 103 mmol/L (ref 98–111)
Creatinine, Ser: 0.91 mg/dL (ref 0.61–1.24)
GFR, Estimated: >60 mL/min (ref >60)
Glucose, Bld: 122 mg/dL — ABNORMAL HIGH (ref 70–99)
Potassium: 4.3 mmol/L (ref 3.5–5.1)
Sodium: 138 mmol/L (ref 135–145)

## 2021-05-19 LAB — ECHOCARDIOGRAM COMPLETE
Area-P 1/2: 4.1 cm2
S' Lateral: 4.1 cm

## 2021-05-19 LAB — BRAIN NATRIURETIC PEPTIDE: B Natriuretic Peptide: 84.8 pg/mL (ref 0.0–100.0)

## 2021-05-19 MED ORDER — SPIRONOLACTONE 25 MG PO TABS: 12.5000 mg | ORAL_TABLET | Freq: Every day | ORAL | 3 refills | Status: DC

## 2021-05-19 NOTE — Telephone Encounter (Signed)
Advanced Heart Failure Patient Advocate Encounter   Patient was approved to receive Entresto from [employer] through 06/06/21.  Patient ID: [id number]  Patient was seen in clinic today and provided [employer] phone number. Reminded that he needs POI for in progress [employer] application and [employer] application for next year. Went ahead and Games developer for [id number].

## 2021-05-19 NOTE — Patient Instructions (Addendum)
EKG done today.  Labs done today. We will contact you only if your labs are abnormal.  START Spironolactone 12.5 (1/2 tablet) by mouth daily.   Please call [phone] for your Delene Loll refill  Please bring in your proof of income to expedite your [facility] patient assistance.   No other medication changes were made. Please continue all current medications as prescribed.  Continue wearing your LifeVest.   Your physician recommends that you schedule a follow-up appointment in: 10 days for a lab only appointment, 3 weeks with our Clinic Pharmacist and in 2 months with Dr. Aundra Dubin and in 3 months for an echo.  Your physician has requested that you have an echocardiogram. Echocardiography is a painless test that uses sound waves to create images of your heart. It provides your doctor with information about the size and shape of your heart and how well your hearts chambers and valves are working. This procedure takes approximately one hour. There are no restrictions for this procedure.   If you have any questions or concerns before your next appointment please send [place] a message through [place] or call our office at [phone].    TO LEAVE A MESSAGE FOR THE NURSE SELECT OPTION 2, PLEASE LEAVE A MESSAGE INCLUDING: YOUR NAME DATE OF BIRTH CALL BACK NUMBER REASON FOR CALL**this is important as we prioritize the call backs  YOU WILL RECEIVE A CALL BACK THE SAME DAY AS LONG AS YOU CALL BEFORE 4:00 PM   Do the following things EVERYDAY: Weigh yourself in the morning before breakfast. Write it down and keep it in a log. Take your medicines as prescribed Eat low salt foods--Limit salt (sodium) to 2000 mg per day.  Stay as active as you can everyday Limit all fluids for the day to less than 2 liters   At the [facility], you and your health needs are our priority. As part of our continuing mission to provide you with exceptional heart care, we have created designated  Provider Care Teams. These Care Teams include your primary Cardiologist (physician) and Advanced Practice Providers (APPs- Physician Assistants and Nurse Practitioners) who all work together to provide you with the care you need, when you need it.   You may see any of the following providers on your designated Care Team at your next follow up: Dr Glori Bickers Dr Haynes Kerns, NP Lyda Jester, [place] Audry Riles, PharmD   Please be sure to bring in all your medications bottles to every appointment.

## 2021-05-19 NOTE — Progress Notes (Signed)
ADVANCED HF CLINIC CONSULT NOTE  Primary Care: Melony Overly, MD HF Cardiologist: Dr. Aundra Dubin   HPI:   Lawrence [name] is a 55 y.o. Hispanic male with a history of chronic knee pain from crush injury, DM, HTN, hyperlipidemia, CAD, ischemic cardiomyopathy.    Admitted 02/12/2021 with chest pain in the setting of STEMI. Had [name] and underwent successful PTCA/DES x 1 mid LAD and successful PTCA/DES x 1 obtuse marginal.  Echo in 9/22 showed EF 30-35%. CMRI with EF 31% and unable to assess viability due to acute MI, no thrombus. Discharged with LifeVest. Discharged on losartan, coreg, jardiance, and spironolactone.     Echo was done today and reviewed, EF 30-35%, septal/apical severe hypokinesis, normal RV.   Patient returns for followup of CHF and CAD.  He is doing well, no chest pain.  He denies exertional dyspnea, orthopnea, PND.  He is wearing his Lifevest.   ECG (personally reviewed): NSR, old anterior MI  Labs (10/22): K 4.6, creatinine 0.99 Labs (11/22): LDL 76, TGs 186   SH: Lives with his wife and child. Disabled from previous knee injury. He does not drink alcohol. He does not smoke.   PMH: 1. Type 2 diabetes 2. HTN 3. Hyperlipidemia 4. CAD: Anterior MI in 9/22 with DES to mLAD and DES to OM2.  5. Chronic systolic CHF: Ischemic cardiomyopathy.   - Echo (9/22): EF 30-35%, normal RV.  - Cardiac MRI (9/22): No thrombus. LGE consistent with myocardial infarction in portion of the mid anterior/anteroseptal Unable to assess for viability in setting of acute MI (LGE overestimates final infarct size in setting of acute MI). EF 31%. Akinesis of mid anterior/anteroseptal walls, apical anterior/septal/inferior walls, and apex. RV normal.  - Echo (12/22): EF 30-35%, septal/apical severe hypokinesis, normal RV.   Review of Systems: All systems reviewed and negative except as per HPI.   Current Outpatient Medications  Medication Sig Dispense Refill   albuterol (VENTOLIN HFA) 108 (90 Base)  MCG/ACT inhaler as needed.     aspirin 81 MG chewable tablet Chew 1 tablet (81 mg total) by mouth daily. 90 tablet 2   blood glucose meter kit and supplies KIT Dispense based on patient and insurance preference. Use up to four times daily as directed. (FOR ICD-9 250.00, 250.01). 1 each 0   carvedilol (COREG) 3.125 MG tablet Take 1 tablet (3.125 mg total) by mouth 2 (two) times daily with a meal. 180 tablet 1   empagliflozin (JARDIANCE) 10 MG TABS tablet Take 1 tablet (10 mg total) by mouth daily. 90 tablet 0   FREESTYLE PRECISION NEO TEST test strip USE 1 STRIP TO CHECK GLUCOSE ONCE DAILY     metFORMIN (GLUCOPHAGE-XR) 500 MG 24 hr tablet Take 1,000 mg by mouth 2 (two) times daily.     nitroGLYCERIN (NITROSTAT) 0.4 MG SL tablet Place 1 tablet (0.4 mg total) under the tongue every 5 (five) minutes as needed for chest pain. 25 tablet 2   omega-3 acid ethyl esters (LOVAZA) 1 g capsule Take by mouth 2 (two) times daily.     rosuvastatin (CRESTOR) 40 MG tablet Take 1 tablet (40 mg total) by mouth daily. 90 tablet 1   sacubitril-valsartan (ENTRESTO) 49-51 MG Take 1 tablet by mouth 2 (two) times daily. 60 tablet 6   spironolactone (ALDACTONE) 25 MG tablet Take 0.5 tablets (12.5 mg total) by mouth daily. 45 tablet 3   ticagrelor (BRILINTA) 90 MG TABS tablet Take 1 tablet (90 mg total) by mouth 2 (two) times  daily. 180 tablet 2   No current facility-administered medications for this encounter.   No Known Allergies   Family History  Problem Relation Age of Onset   Cancer Mother    Cancer Father    BP 108/68    Pulse 84    Wt 80.6 kg (177 lb 12.8 oz)    SpO2 100%    BMI 27.03 kg/m   Wt Readings from Last 3 Encounters:  05/19/21 80.6 kg (177 lb 12.8 oz)  03/25/21 81.2 kg (179 lb)  03/11/21 80.7 kg (178 lb)   PHYSICAL EXAM: General: NAD Neck: No JVD, no thyromegaly or thyroid nodule.  Lungs: Clear to auscultation bilaterally with normal respiratory effort. CV: Nondisplaced PMI.  Heart regular  S1/S2, no S3/S4, no murmur.  No peripheral edema.  No carotid bruit.  Normal pedal pulses.  Abdomen: Soft, nontender, no hepatosplenomegaly, no distention.  Skin: Intact without lesions or rashes.  Neurologic: Alert and oriented x 3.  Psych: Normal affect. Extremities: No clubbing or cyanosis.  HEENT: Normal.   ASSESSMENT & PLAN: 1. Chronic systolic CHF:  Ischemic cardiomyopathy.  Echo in 9/22 with EF 30-35%.  Repeat echo done today showed EF still in the 30-35% range with normal RV.  He is not volume overloaded on exam.  NYHA class I-II.  - Continue Entresto 49/51 bid.  - Start spironolactone 12.5 mg daily, BMET today and again in 10 days.  Spironolactone stopped in the past with upper normal K, suspect he can tolerate it.  - Continue carvedilol 3.125 mg bid. - Continue Jardiance 10 mg daily. - EF still < 35%, we discussed ICD placement.  He is not ready to commit to ICD yet, wants to try medication titration longer.  I asked him to keep Lifevest on and I will repeat echo in 3 months. Continue aggressive GDMT titration.  2. CAD: Anterior STEMI in 9/22 with severe mLAD stenosis treated with PCI/DES x1, and severe OM stenosis treated with PCI/DES x1, non dominant RCA with mild non-obstructive disease.  No chest pain.   - Will need DAPT with ASA/Brilinta for one year. - Continue Crestor 40 mg daily. 3. HTN: BP controlled.  4. Hyperlipidemia: Continue statin.   5. DMII: Per PCP   See HF pharmacist in 3 wks x 2 visits for med titration, followup with me in 2 months.  Will get repeat echo in 3 months.   Lawrence [name] 05/19/2021 1:50 PM

## 2021-05-26 ENCOUNTER — Other Ambulatory Visit (HOSPITAL_COMMUNITY): Payer: Self-pay

## 2021-05-26 MED ORDER — ENTRESTO 49-51 MG PO TABS: 1.0000 | ORAL_TABLET | Freq: Two times a day (BID) | ORAL | 3 refills | Status: DC

## 2021-05-26 NOTE — Progress Notes (Signed)
Advanced Heart Failure Clinic Note   Primary Care: Melony Overly, MD HF Cardiologist: Dr. Aundra Dubin  HPI:  Mr Gauthreaux is a 55 y.o. Hispanic male with a history of chronic knee pain from crush injury, DM, HTN, hyperlipidemia, CAD, ischemic cardiomyopathy.    Admitted 02/12/2021 with chest pain in the setting of STEMI. Had [place] and underwent successful PTCA/DES x 1 mid LAD and successful PTCA/DES x 1 obtuse marginal.  Echo in 02/2021 showed EF 30-35%. CMRI with EF 31% and unable to assess viability due to acute MI, no thrombus. Discharged with LifeVest. Discharged on losartan, carvedilol, jardiance, and spironolactone.     Echo was done today and reviewed, EF 30-35%, septal/apical severe hypokinesis, normal RV.    Patient recently returned to [facility] for follow up of CHF and CAD.  He was doing well, no chest pain.  He denied exertional dyspnea, orthopnea, PND.  He was wearing his Lifevest.   Today he returns to HF clinic for pharmacist medication titration. At last visit with MD spironolactone 12.5 mg daily was initiated. Overall he is feeling well today. States he feels like he can do more things. No dizziness, lightheadedness, CP or palpitations. No SOB/DOE. Has been walking 30 minutes every day. Weight at home has been stable at 175 lbs. Does not take a loop diuretic. No LEE, PND or orthopnea. Appetite has been good and he has been trying to "eat better" recently.    HF Medications: Carvedilol 3.125 mg BID Entresto 49/51 mg BID Spironolactone 12.5 mg daily Jardiance 10 mg daily  Has the patient been experiencing any side effects to the medications prescribed?  no  Does the patient have any problems obtaining medications due to transportation or finances?   [employer] Medicare insurance. Has PAP for Brilinta through [employer]. PAP for Entresto re-enrollment is pending (POI needed). Jardiance PAP is also pending and POI is needed before application will be processed. Patient aware he  needs to bring in POI and will bring it to the clinic next week.   Understanding of regimen: good Understanding of indications: good Potential of compliance: good Patient understands to avoid NSAIDs. Patient understands to avoid decongestants.    Pertinent Lab Values: 06/03/21: Serum creatinine 1.22, BUN 26, Potassium 4.6, Sodium 136  Vital Signs: Weight: 178 lbs (last clinic weight: 177.8 lbs) Blood pressure: 118/68  Heart rate: 87   Assessment/Plan: 1. Chronic systolic CHF:  Ischemic cardiomyopathy.  Echo in 02/2021 with EF 30-35%.  Echo 05/2021 showed EF still in the 30-35% range with normal RV.   - He is not volume overloaded on exam.  NYHA class II.  - Increase carvedilol to 6.25 mg BID. - Continue Entresto 49/51 mg BID.  - Continue spironolactone 12.5 mg daily. Spironolactone stopped in the past with upper normal K, suspect he can tolerate it. K normal on BMET 06/03/21.  - Continue Jardiance 10 mg daily. - EF still < 35%, ICD placement discussed with Dr. Aundra Dubin last visit.  He was not ready to commit to ICD yet, wants to try medication titration longer.  He will keep Lifevest on and will repeat echo 08/19/21. Continue aggressive GDMT titration.  2. CAD: Anterior STEMI in 02/2021 with severe mLAD stenosis treated with PCI/DES x1, and severe OM stenosis treated with PCI/DES x1, non dominant RCA with mild non-obstructive disease.  No chest pain.   - Will need DAPT with ASA/Brilinta for one year. - Continue Crestor 40 mg daily. 3. HTN: BP controlled.  4.  Hyperlipidemia: Continue statin.   5. DMII: Per PCP    Follow up 4 weeks with Pharmacy Clinic.    Audry Riles, PharmD, BCPS, BCCP, CPP Heart Failure Clinic Pharmacist [phone]

## 2021-05-28 ENCOUNTER — Other Ambulatory Visit (HOSPITAL_COMMUNITY): Payer: PPO

## 2021-05-28 NOTE — Telephone Encounter (Signed)
Sent in  [employer] application via fax.  Will follow up.  Of note, patient has yet to provide POI. Will initially be denied.

## 2021-06-03 ENCOUNTER — Other Ambulatory Visit: Payer: Self-pay

## 2021-06-03 ENCOUNTER — Ambulatory Visit (HOSPITAL_COMMUNITY)
Admission: RE | Admit: 2021-06-03 | Discharge: 2021-06-03 | Disposition: A | Discharge status: Home / Self Care | Payer: PPO | Source: Ambulatory Visit | Attending: Cardiology | Admitting: Cardiology

## 2021-06-03 DIAGNOSIS — I5022 Chronic systolic (congestive) heart failure: Secondary | ICD-10-CM | POA: Insufficient documentation

## 2021-06-03 LAB — BASIC METABOLIC PANEL
Anion gap: 8 (ref 5–15)
BUN: 26 mg/dL — ABNORMAL HIGH (ref 6–20)
CO2: 26 mmol/L (ref 22–32)
Calcium: 9.5 mg/dL (ref 8.9–10.3)
Chloride: 102 mmol/L (ref 98–111)
Creatinine, Ser: 1.22 mg/dL (ref 0.61–1.24)
GFR, Estimated: >60 mL/min (ref >60)
Glucose, Bld: 139 mg/dL — ABNORMAL HIGH (ref 70–99)
Potassium: 4.6 mmol/L (ref 3.5–5.1)
Sodium: 136 mmol/L (ref 135–145)

## 2021-06-09 DIAGNOSIS — I252 Old myocardial infarction: Secondary | ICD-10-CM | POA: Diagnosis not present

## 2021-06-09 DIAGNOSIS — Z955 Presence of coronary angioplasty implant and graft: Secondary | ICD-10-CM | POA: Diagnosis not present

## 2021-06-11 ENCOUNTER — Ambulatory Visit (HOSPITAL_COMMUNITY)
Admission: RE | Admit: 2021-06-11 | Discharge: 2021-06-11 | Disposition: A | Discharge status: Home / Self Care | Payer: PPO | Source: Ambulatory Visit | Attending: Cardiology | Admitting: Cardiology

## 2021-06-11 ENCOUNTER — Other Ambulatory Visit: Payer: Self-pay

## 2021-06-11 VITALS — BP 118/68 | HR 87 | Wt 178.0 lb

## 2021-06-11 DIAGNOSIS — I252 Old myocardial infarction: Secondary | ICD-10-CM | POA: Diagnosis not present

## 2021-06-11 DIAGNOSIS — Z955 Presence of coronary angioplasty implant and graft: Secondary | ICD-10-CM | POA: Insufficient documentation

## 2021-06-11 DIAGNOSIS — Z79899 Other long term (current) drug therapy: Secondary | ICD-10-CM | POA: Insufficient documentation

## 2021-06-11 DIAGNOSIS — I2510 Atherosclerotic heart disease of native coronary artery without angina pectoris: Secondary | ICD-10-CM | POA: Diagnosis not present

## 2021-06-11 DIAGNOSIS — E785 Hyperlipidemia, unspecified: Secondary | ICD-10-CM | POA: Diagnosis not present

## 2021-06-11 DIAGNOSIS — E119 Type 2 diabetes mellitus without complications: Secondary | ICD-10-CM | POA: Diagnosis not present

## 2021-06-11 DIAGNOSIS — I110 Hypertensive heart disease with heart failure: Secondary | ICD-10-CM | POA: Diagnosis not present

## 2021-06-11 DIAGNOSIS — Z7982 Long term (current) use of aspirin: Secondary | ICD-10-CM | POA: Insufficient documentation

## 2021-06-11 DIAGNOSIS — Z7984 Long term (current) use of oral hypoglycemic drugs: Secondary | ICD-10-CM | POA: Diagnosis not present

## 2021-06-11 DIAGNOSIS — G8929 Other chronic pain: Secondary | ICD-10-CM | POA: Insufficient documentation

## 2021-06-11 DIAGNOSIS — Y939 Activity, unspecified: Secondary | ICD-10-CM | POA: Insufficient documentation

## 2021-06-11 DIAGNOSIS — S8700XS Crushing injury of unspecified knee, sequela: Secondary | ICD-10-CM | POA: Insufficient documentation

## 2021-06-11 DIAGNOSIS — M25569 Pain in unspecified knee: Secondary | ICD-10-CM | POA: Diagnosis not present

## 2021-06-11 DIAGNOSIS — I5022 Chronic systolic (congestive) heart failure: Secondary | ICD-10-CM

## 2021-06-11 DIAGNOSIS — Z7902 Long term (current) use of antithrombotics/antiplatelets: Secondary | ICD-10-CM | POA: Diagnosis not present

## 2021-06-11 DIAGNOSIS — I255 Ischemic cardiomyopathy: Secondary | ICD-10-CM | POA: Insufficient documentation

## 2021-06-11 MED ORDER — CARVEDILOL 6.25 MG PO TABS: 6.2500 mg | ORAL_TABLET | Freq: Two times a day (BID) | ORAL | 3 refills | Status: DC

## 2021-06-11 NOTE — Patient Instructions (Addendum)
It was a pleasure seeing you today!  MEDICATIONS: -We are changing your medications today  -Increase carvedilol to 6.25 mg (1 tablet) twice daily.   -Please bring your proof of income to the clinic for Jardiance and Entresto assistance applications. You can bring in the 1st two pages of your tax return.  -Call if you have questions about your medications.   NEXT APPOINTMENT: Return to clinic in 4 weeks with Pharmacy Clinic.  In general, to take care of your heart failure: -Limit your fluid intake to 2 Liters (half-gallon) per day.   -Limit your salt intake to ideally 2-3 grams (2000-3000 mg) per day. -Weigh yourself daily and record, and bring that "weight diary" to your next appointment.  (Weight gain of 2-3 pounds in 1 day typically means fluid weight.) -The medications for your heart are to help your heart and help you live longer.   -Please contact us before stopping any of your heart medications.  Call the clinic at [phone] with questions or to reschedule future appointments.

## 2021-06-19 DIAGNOSIS — I420 Dilated cardiomyopathy: Secondary | ICD-10-CM | POA: Diagnosis not present

## 2021-06-19 DIAGNOSIS — I252 Old myocardial infarction: Secondary | ICD-10-CM | POA: Diagnosis not present

## 2021-06-30 NOTE — Progress Notes (Signed)
Advanced Heart Failure Clinic Note   Primary Care: Melony Overly, MD HF Cardiologist: Dr. Aundra Dubin  HPI:  Lawrence [name] is a 56 y.o. Hispanic male with a history of chronic knee pain from crush injury, DM, HTN, hyperlipidemia, CAD, ischemic cardiomyopathy.    Admitted 02/12/2021 with chest pain in the setting of STEMI. Had [place] and underwent successful PTCA/DES x 1 mid LAD and successful PTCA/DES x 1 obtuse marginal.  Echo in 02/2021 showed EF 30-35%. CMRI with EF 31% and unable to assess viability due to acute MI, no thrombus. Discharged with LifeVest. Discharged on losartan, carvedilol, jardiance, and spironolactone.     Echo 05/19/21, EF 30-35%, septal/apical severe hypokinesis, normal RV.    Patient recently returned to [facility] for follow up of CHF and CAD.  He was doing well, no chest pain.  He denied exertional dyspnea, orthopnea, PND.  He was wearing his Lifevest.   Presented to HF clinic for pharmacist medication titration 06/11/21. Overall he was feeling well. Stated he felt like he could do more things. No dizziness, lightheadedness, CP or palpitations. No SOB/DOE. Had been walking 30 minutes every day. Weight at home had been stable at 175 lbs. Does not take a loop diuretic. No LEE, PND or orthopnea. Appetite had been good and he had been trying to "eat better" recently.   Today he returns to HF clinic for pharmacist medication titration. At last visit with pharmacy clinic, carvedilol was increased to 6.25 mg BID. Overall he is feeling well today. No dizziness, lightheadedness, chest pain or palpitations. No SOB/DOE. Able to walk 1 mile before needing to stop due to leg pain. Is planning to join a gym to help him stay active. Weight has been stable, ~175 lbs on home scale. No LEE, PND or orthopnea. BP at home on last check was 128/79, BP in clinic 130/82.    HF Medications: Carvedilol 6.25 mg BID Entresto 49/51 mg BID Spironolactone 12.5 mg daily Jardiance 10 mg daily  Has the  patient been experiencing any side effects to the medications prescribed?  no  Does the patient have any problems obtaining medications due to transportation or finances?   [employer] Medicare insurance. Has PAP for Brilinta through [employer] and PAP for Entresto through [employer]. Obtained PAN grant for [employer] today.   Understanding of regimen: good Understanding of indications: good Potential of compliance: good Patient understands to avoid NSAIDs. Patient understands to avoid decongestants.    Pertinent Lab Values: 06/03/21: Serum creatinine 1.22, BUN 26, Potassium 4.6, Sodium 136  Vital Signs: Weight: 180.2 lbs (last clinic weight: 178 lbs) Blood pressure: 130/82 Heart rate: 81   Assessment/Plan: 1. Chronic systolic CHF:  Ischemic cardiomyopathy.  Echo in 02/2021 with EF 30-35%.  Echo 05/2021 showed EF still in the 30-35% range with normal RV.   - He is not volume overloaded on exam.  NYHA class II.  - Continue carvedilol 6.25 mg BID. - Increase Entresto to 97/103 mg BID. Repeat BMET in 2 weeks. Obtains from [employer] PAP.  - Continue spironolactone 12.5 mg daily. Spironolactone stopped in the past with upper normal K, suspect he can tolerate it. K normal on BMET 06/03/21.  - Increase Jardiance to 25 mg daily (patient has concomitant T2DM and was previously on this dose).  - EF still < 35%, ICD placement discussed with Dr. Aundra Dubin last visit.  He was not ready to commit to ICD yet, wants to try medication titration longer.  He will keep Lifevest on  and will repeat echo 08/19/21. Continue aggressive GDMT titration.  2. CAD: Anterior STEMI in 02/2021 with severe mLAD stenosis treated with PCI/DES x1, and severe OM stenosis treated with PCI/DES x1, non dominant RCA with mild non-obstructive disease.  No chest pain.   - Will need DAPT with ASA/Brilinta for one year. - Continue Crestor 40 mg daily. 3. HTN: BP controlled.  4. Hyperlipidemia: Continue statin.   5. DMII: Per PCP   -Increase Jardiance to 25 mg daily as above.   Follow up in 2 weeks with Dr. Rush Farmer, PharmD, BCPS, [facility], [facility] Pharmacist [phone]

## 2021-07-09 ENCOUNTER — Other Ambulatory Visit: Payer: Self-pay

## 2021-07-09 ENCOUNTER — Telehealth (HOSPITAL_COMMUNITY): Payer: Self-pay | Admitting: Pharmacist

## 2021-07-09 ENCOUNTER — Other Ambulatory Visit (HOSPITAL_COMMUNITY): Payer: Self-pay

## 2021-07-09 ENCOUNTER — Ambulatory Visit (HOSPITAL_COMMUNITY)
Admission: RE | Admit: 2021-07-09 | Discharge: 2021-07-09 | Disposition: A | Discharge status: Home / Self Care | Payer: PPO | Source: Ambulatory Visit | Attending: Internal Medicine | Admitting: Internal Medicine

## 2021-07-09 VITALS — BP 130/82 | HR 81 | Wt 180.2 lb

## 2021-07-09 DIAGNOSIS — I255 Ischemic cardiomyopathy: Secondary | ICD-10-CM | POA: Diagnosis not present

## 2021-07-09 DIAGNOSIS — E119 Type 2 diabetes mellitus without complications: Secondary | ICD-10-CM | POA: Insufficient documentation

## 2021-07-09 DIAGNOSIS — I110 Hypertensive heart disease with heart failure: Secondary | ICD-10-CM | POA: Insufficient documentation

## 2021-07-09 DIAGNOSIS — I5022 Chronic systolic (congestive) heart failure: Secondary | ICD-10-CM | POA: Diagnosis not present

## 2021-07-09 DIAGNOSIS — E785 Hyperlipidemia, unspecified: Secondary | ICD-10-CM | POA: Insufficient documentation

## 2021-07-09 DIAGNOSIS — S8700XS Crushing injury of unspecified knee, sequela: Secondary | ICD-10-CM | POA: Diagnosis not present

## 2021-07-09 DIAGNOSIS — I2510 Atherosclerotic heart disease of native coronary artery without angina pectoris: Secondary | ICD-10-CM | POA: Insufficient documentation

## 2021-07-09 DIAGNOSIS — Z955 Presence of coronary angioplasty implant and graft: Secondary | ICD-10-CM | POA: Diagnosis not present

## 2021-07-09 DIAGNOSIS — M25569 Pain in unspecified knee: Secondary | ICD-10-CM | POA: Insufficient documentation

## 2021-07-09 DIAGNOSIS — Z7984 Long term (current) use of oral hypoglycemic drugs: Secondary | ICD-10-CM | POA: Insufficient documentation

## 2021-07-09 DIAGNOSIS — I252 Old myocardial infarction: Secondary | ICD-10-CM | POA: Diagnosis not present

## 2021-07-09 DIAGNOSIS — X58XXXS Exposure to other specified factors, sequela: Secondary | ICD-10-CM | POA: Diagnosis not present

## 2021-07-09 DIAGNOSIS — G8929 Other chronic pain: Secondary | ICD-10-CM | POA: Diagnosis not present

## 2021-07-09 DIAGNOSIS — Z79899 Other long term (current) drug therapy: Secondary | ICD-10-CM | POA: Diagnosis not present

## 2021-07-09 MED ORDER — EMPAGLIFLOZIN 25 MG PO TABS: 25.0000 mg | ORAL_TABLET | Freq: Every day | ORAL | 11 refills | Status: DC

## 2021-07-09 MED ORDER — SACUBITRIL-VALSARTAN 97-103 MG PO TABS: 1.0000 | ORAL_TABLET | Freq: Two times a day (BID) | ORAL | 3 refills | Status: DC

## 2021-07-09 NOTE — Telephone Encounter (Signed)
Medication Samples have been provided to the patient.   Drug name: Delene Loll     Strength: 49/51 mg      Qty: 1 bottle                 LOT: [id number] Exp.Date: 04/2023      The patient has been instructed regarding the correct time, dose, and frequency of taking this medication, including desired effects and most common side effects.   Audry Riles, PharmD, BCPS, CPP Heart Failure Clinic Pharmacist [phone]

## 2021-07-09 NOTE — Telephone Encounter (Signed)
Advanced Heart Failure Patient Advocate Encounter  Called [employer] to check the status of the patient's application. Representative stated that when the patient was approved in November of 2022, that he was approved through 06/06/22. The letter reflected an end date of 06/06/21. Representative confirmed that the patient would need to call and request a refill. I requested the refill on behalf of the patient. The delivery ETA is supposed to be Monday 02/06.  Charlann Boxer, CPhT

## 2021-07-09 NOTE — Patient Instructions (Signed)
It was a pleasure seeing you today!  MEDICATIONS: -We are changing your medications today - -Increase Entresto to 97/103 mg (1 tablet) twice daily. You may take 2 tablets of the 49/51 mg strength twice daily until you receive the new strength.   - Increase Jardiance to 25 mg (1 tablet) daily -Call if you have questions about your medications.   NEXT APPOINTMENT: Return to clinic in 2 weeks with Dr. Aundra Dubin.  In general, to take care of your heart failure: -Limit your fluid intake to 2 Liters (half-gallon) per day.   -Limit your salt intake to ideally 2-3 grams (2000-3000 mg) per day. -Weigh yourself daily and record, and bring that "weight diary" to your next appointment.  (Weight gain of 2-3 pounds in 1 day typically means fluid weight.) -The medications for your heart are to help your heart and help you live longer.   -Please contact us before stopping any of your heart medications.  Call the clinic at [phone] with questions or to reschedule future appointments.

## 2021-07-09 NOTE — Telephone Encounter (Signed)
Was successful in securing patient an $69 grant from [employer] [facility]) to provide copayment coverage for Jardiance.  This will keep the out of pocket expense at $0.     I have spoken with the patient.    The billing information is as follows and has been shared with the pharmacy.   Member ID: [id number] Group ID: [id number] RxBin ID: [id number] PCN: [id number] Eligibility Start Date: 04/10/2021 Eligibility End Date: 07/08/2022 Assistance Amount: $1,200.00  Fund:  Heart Failure   Audry Riles, PharmD, BCPS, BCCP, CPP Heart Failure Clinic Pharmacist [phone]

## 2021-07-23 ENCOUNTER — Ambulatory Visit (HOSPITAL_COMMUNITY)
Admission: RE | Admit: 2021-07-23 | Discharge: 2021-07-23 | Disposition: A | Discharge status: Home / Self Care | Payer: PPO | Source: Ambulatory Visit | Attending: Cardiology | Admitting: Cardiology

## 2021-07-23 ENCOUNTER — Encounter (HOSPITAL_COMMUNITY): Payer: Self-pay | Admitting: Cardiology

## 2021-07-23 ENCOUNTER — Other Ambulatory Visit: Payer: Self-pay

## 2021-07-23 VITALS — BP 140/80 | HR 90 | Wt 183.2 lb

## 2021-07-23 DIAGNOSIS — Z7901 Long term (current) use of anticoagulants: Secondary | ICD-10-CM | POA: Insufficient documentation

## 2021-07-23 DIAGNOSIS — Z955 Presence of coronary angioplasty implant and graft: Secondary | ICD-10-CM | POA: Diagnosis not present

## 2021-07-23 DIAGNOSIS — Z7984 Long term (current) use of oral hypoglycemic drugs: Secondary | ICD-10-CM | POA: Insufficient documentation

## 2021-07-23 DIAGNOSIS — E119 Type 2 diabetes mellitus without complications: Secondary | ICD-10-CM | POA: Insufficient documentation

## 2021-07-23 DIAGNOSIS — I255 Ischemic cardiomyopathy: Secondary | ICD-10-CM

## 2021-07-23 DIAGNOSIS — I110 Hypertensive heart disease with heart failure: Secondary | ICD-10-CM | POA: Insufficient documentation

## 2021-07-23 DIAGNOSIS — Z79899 Other long term (current) drug therapy: Secondary | ICD-10-CM | POA: Insufficient documentation

## 2021-07-23 DIAGNOSIS — I252 Old myocardial infarction: Secondary | ICD-10-CM | POA: Insufficient documentation

## 2021-07-23 DIAGNOSIS — M25569 Pain in unspecified knee: Secondary | ICD-10-CM | POA: Insufficient documentation

## 2021-07-23 DIAGNOSIS — E785 Hyperlipidemia, unspecified: Secondary | ICD-10-CM | POA: Diagnosis not present

## 2021-07-23 DIAGNOSIS — W230XXS Caught, crushed, jammed, or pinched between moving objects, sequela: Secondary | ICD-10-CM | POA: Diagnosis not present

## 2021-07-23 DIAGNOSIS — G8929 Other chronic pain: Secondary | ICD-10-CM | POA: Insufficient documentation

## 2021-07-23 DIAGNOSIS — I5022 Chronic systolic (congestive) heart failure: Secondary | ICD-10-CM | POA: Diagnosis not present

## 2021-07-23 DIAGNOSIS — I2510 Atherosclerotic heart disease of native coronary artery without angina pectoris: Secondary | ICD-10-CM | POA: Diagnosis not present

## 2021-07-23 MED ORDER — SPIRONOLACTONE 25 MG PO TABS: 25.0000 mg | ORAL_TABLET | Freq: Every day | ORAL | 3 refills | Status: DC

## 2021-07-23 MED ORDER — CARVEDILOL 12.5 MG PO TABS: 12.5000 mg | ORAL_TABLET | Freq: Two times a day (BID) | ORAL | 3 refills | Status: DC

## 2021-07-23 NOTE — Patient Instructions (Signed)
Increase Spironolactone to 25 mg (1 tab) Daily  Increase Carvedilol to 12.5 mg Twice daily   Labs done today, we will call you for abnormal results  Your physician recommends that you return for lab work in: 1-2 weeks, we have given you a prescription to have this done locally  Your physician recommends that you schedule a follow-up appointment in: 2 months  If you have any questions or concerns before your next appointment please send [place] a message through [place] or call our office at [phone].    TO LEAVE A MESSAGE FOR THE NURSE SELECT OPTION 2, PLEASE LEAVE A MESSAGE INCLUDING: YOUR NAME DATE OF BIRTH CALL BACK NUMBER REASON FOR CALL**this is important as we prioritize the call backs  YOU WILL RECEIVE A CALL BACK THE SAME DAY AS LONG AS YOU CALL BEFORE 4:00 PM  At the [facility], you and your health needs are our priority. As part of our continuing mission to provide you with exceptional heart care, we have created designated Provider Care Teams. These Care Teams include your primary Cardiologist (physician) and Advanced Practice Providers (APPs- Physician Assistants and Nurse Practitioners) who all work together to provide you with the care you need, when you need it.   You may see any of the following providers on your designated Care Team at your next follow up: Dr Glori Bickers Dr Haynes Kerns, NP Lyda Jester, [place] [facility] [place], [place] Audry Riles, PharmD   Please be sure to bring in all your medications bottles to every appointment.

## 2021-07-23 NOTE — Progress Notes (Signed)
ADVANCED HF CLINIC CONSULT NOTE  Primary Care: Melony Overly, MD HF Cardiologist: Dr. Aundra Dubin   HPI:   Lawrence [name] is a 56 y.o. Hispanic male with a history of chronic knee pain from crush injury, DM, HTN, hyperlipidemia, CAD, ischemic cardiomyopathy.    Admitted 02/12/2021 with chest pain in the setting of STEMI. Had [place] and underwent successful PTCA/DES x 1 mid LAD and successful PTCA/DES x 1 obtuse marginal.  Echo in 9/22 showed EF 30-35%. CMRI with EF 31% and unable to assess viability due to acute MI, no thrombus. Discharged with LifeVest. Discharged on losartan, coreg, jardiance, and spironolactone.     Echo in 12/22 showed EF 30-35%, septal/apical severe hypokinesis, normal RV.  Patient wanted to wait a few more months and repeat echo before committing to ICD.  He has kept the [place] on.   Patient returns for followup of CHF and CAD.  He continues to do well. Walks with cane because of prior knee injury.  No exertional dyspnea or chest pain.  No orthopnea/PND.  Tries to walk 30-45 minutes/day.  Weight is up 6 lbs but he reports decreased activity and more eating over the winter.    Labs (10/22): K 4.6, creatinine 0.99 Labs (11/22): LDL 76, TGs 186 Labs (12/22): BNP 85, K 4.6, creatinine 1.22   SH: Lives with his wife and child. Disabled from previous knee injury. He does not drink alcohol. He does not smoke.   PMH: 1. Type 2 diabetes 2. HTN 3. Hyperlipidemia 4. CAD: Anterior MI in 9/22 with DES to mLAD and DES to OM2.  5. Chronic systolic CHF: Ischemic cardiomyopathy.   - Echo (9/22): EF 30-35%, normal RV.  - Cardiac MRI (9/22): No thrombus. LGE consistent with myocardial infarction in portion of the mid anterior/anteroseptal Unable to assess for viability in setting of acute MI (LGE overestimates final infarct size in setting of acute MI). EF 31%. Akinesis of mid anterior/anteroseptal walls, apical anterior/septal/inferior walls, and apex. RV normal.  - Echo (12/22): EF  30-35%, septal/apical severe hypokinesis, normal RV.   Review of Systems: All systems reviewed and negative except as per HPI.   Current Outpatient Medications  Medication Sig Dispense Refill   albuterol (VENTOLIN HFA) 108 (90 Base) MCG/ACT inhaler as needed.     aspirin 81 MG chewable tablet Chew 1 tablet (81 mg total) by mouth daily. 90 tablet 2   blood glucose meter kit and supplies KIT Dispense based on patient and insurance preference. Use up to four times daily as directed. (FOR ICD-9 250.00, 250.01). 1 each 0   empagliflozin (JARDIANCE) 25 MG TABS tablet Take 1 tablet (25 mg total) by mouth daily. 30 tablet 11   FREESTYLE PRECISION NEO TEST test strip USE 1 STRIP TO CHECK GLUCOSE ONCE DAILY     metFORMIN (GLUCOPHAGE-XR) 500 MG 24 hr tablet Take 1,000 mg by mouth 2 (two) times daily.     nitroGLYCERIN (NITROSTAT) 0.4 MG SL tablet Place 1 tablet (0.4 mg total) under the tongue every 5 (five) minutes as needed for chest pain. 25 tablet 2   omega-3 acid ethyl esters (LOVAZA) 1 g capsule Take by mouth 2 (two) times daily.     rosuvastatin (CRESTOR) 40 MG tablet Take 1 tablet (40 mg total) by mouth daily. 90 tablet 1   sacubitril-valsartan (ENTRESTO) 97-103 MG Take 1 tablet by mouth 2 (two) times daily. 180 tablet 3   ticagrelor (BRILINTA) 90 MG TABS tablet Take 1 tablet (90 mg total) by  mouth 2 (two) times daily. 180 tablet 2   carvedilol (COREG) 12.5 MG tablet Take 1 tablet (12.5 mg total) by mouth 2 (two) times daily with a meal. 60 tablet 3   spironolactone (ALDACTONE) 25 MG tablet Take 1 tablet (25 mg total) by mouth daily. 90 tablet 3   No current facility-administered medications for this encounter.   No Known Allergies   Family History  Problem Relation Age of Onset   Cancer Mother    Cancer Father    BP 140/80    Pulse 90    Wt 83.1 kg (183 lb 3.2 oz)    SpO2 99%    BMI 27.86 kg/m   Wt Readings from Last 3 Encounters:  07/23/21 83.1 kg (183 lb 3.2 oz)  07/09/21 81.7 kg  (180 lb 3.2 oz)  06/11/21 80.7 kg (178 lb)   PHYSICAL EXAM: General: NAD Neck: No JVD, no thyromegaly or thyroid nodule.  Lungs: Clear to auscultation bilaterally with normal respiratory effort. CV: Nondisplaced PMI.  Heart regular S1/S2, no S3/S4, no murmur.  No peripheral edema.  No carotid bruit.  Normal pedal pulses.  Abdomen: Soft, nontender, no hepatosplenomegaly, no distention.  Skin: Intact without lesions or rashes.  Neurologic: Alert and oriented x 3.  Psych: Normal affect. Extremities: No clubbing or cyanosis.  HEENT: Normal.   ASSESSMENT & PLAN: 1. Chronic systolic CHF:  Ischemic cardiomyopathy.  Echo in 9/22 with EF 30-35%.  Repeat echo in 12/22 showed EF still in the 30-35% range with normal RV.  He wanted to wait a few more months and repeat echo before deciding on ICD.  He is not volume overloaded on exam.  NYHA class I-II.  - Continue Entresto 97/103 bid.  - Increase spironolactone to 25 mg daily. BMET today and in 10 days.  - Increase Coreg to 6.25 mg bid.  - Continue Jardiance 10 mg daily. - Repeat echo in 3/23.  If EF still < 35%, we discussed ICD placement.  He will keep Lifevest on until repeat echo.   2. CAD: Anterior STEMI in 9/22 with severe mLAD stenosis treated with PCI/DES x1, and severe OM stenosis treated with PCI/DES x1, non dominant RCA with mild non-obstructive disease.  No chest pain.   - Will need DAPT with ASA/Brilinta for one year. - Continue Crestor 40 mg daily. 3. HTN: BP mildly elevated, increasing meds as above.  4. Hyperlipidemia: Continue statin.  Check lipids today.  5. DMII: Per PCP   Echo in 3/23, followup after echo.   Loralie Champagne 07/23/2021

## 2021-07-30 DIAGNOSIS — I252 Old myocardial infarction: Secondary | ICD-10-CM | POA: Diagnosis not present

## 2021-07-30 DIAGNOSIS — I420 Dilated cardiomyopathy: Secondary | ICD-10-CM | POA: Diagnosis not present

## 2021-08-05 DIAGNOSIS — H35371 Puckering of macula, right eye: Secondary | ICD-10-CM | POA: Diagnosis not present

## 2021-08-10 DIAGNOSIS — I255 Ischemic cardiomyopathy: Secondary | ICD-10-CM | POA: Diagnosis not present

## 2021-08-10 DIAGNOSIS — Z1331 Encounter for screening for depression: Secondary | ICD-10-CM | POA: Diagnosis not present

## 2021-08-10 DIAGNOSIS — K5909 Other constipation: Secondary | ICD-10-CM | POA: Diagnosis not present

## 2021-08-10 DIAGNOSIS — I252 Old myocardial infarction: Secondary | ICD-10-CM | POA: Diagnosis not present

## 2021-08-10 DIAGNOSIS — Z6828 Body mass index (BMI) 28.0-28.9, adult: Secondary | ICD-10-CM | POA: Diagnosis not present

## 2021-08-10 DIAGNOSIS — E1169 Type 2 diabetes mellitus with other specified complication: Secondary | ICD-10-CM | POA: Diagnosis not present

## 2021-08-10 DIAGNOSIS — E785 Hyperlipidemia, unspecified: Secondary | ICD-10-CM | POA: Diagnosis not present

## 2021-08-10 DIAGNOSIS — Z2821 Immunization not carried out because of patient refusal: Secondary | ICD-10-CM | POA: Diagnosis not present

## 2021-08-10 DIAGNOSIS — I10 Essential (primary) hypertension: Secondary | ICD-10-CM | POA: Diagnosis not present

## 2021-08-19 ENCOUNTER — Ambulatory Visit (HOSPITAL_COMMUNITY)
Admission: RE | Admit: 2021-08-19 | Discharge: 2021-08-19 | Disposition: A | Discharge status: Home / Self Care | Payer: PPO | Source: Ambulatory Visit | Attending: Family Medicine | Admitting: Family Medicine

## 2021-08-19 ENCOUNTER — Other Ambulatory Visit (HOSPITAL_COMMUNITY): Payer: Self-pay | Admitting: Cardiology

## 2021-08-19 ENCOUNTER — Other Ambulatory Visit: Payer: Self-pay

## 2021-08-19 DIAGNOSIS — I429 Cardiomyopathy, unspecified: Secondary | ICD-10-CM | POA: Diagnosis not present

## 2021-08-19 DIAGNOSIS — E785 Hyperlipidemia, unspecified: Secondary | ICD-10-CM | POA: Insufficient documentation

## 2021-08-19 DIAGNOSIS — I77819 Aortic ectasia, unspecified site: Secondary | ICD-10-CM | POA: Diagnosis not present

## 2021-08-19 DIAGNOSIS — I252 Old myocardial infarction: Secondary | ICD-10-CM | POA: Insufficient documentation

## 2021-08-19 DIAGNOSIS — I5022 Chronic systolic (congestive) heart failure: Secondary | ICD-10-CM | POA: Diagnosis not present

## 2021-08-19 DIAGNOSIS — I2510 Atherosclerotic heart disease of native coronary artery without angina pectoris: Secondary | ICD-10-CM | POA: Insufficient documentation

## 2021-08-19 DIAGNOSIS — E119 Type 2 diabetes mellitus without complications: Secondary | ICD-10-CM | POA: Insufficient documentation

## 2021-08-19 DIAGNOSIS — I5042 Chronic combined systolic (congestive) and diastolic (congestive) heart failure: Secondary | ICD-10-CM | POA: Insufficient documentation

## 2021-08-19 DIAGNOSIS — I358 Other nonrheumatic aortic valve disorders: Secondary | ICD-10-CM | POA: Diagnosis not present

## 2021-08-19 LAB — ECHOCARDIOGRAM COMPLETE
Area-P 1/2: 3.91 cm2
Calc EF: 35.4 %
S' Lateral: 4.5 cm
Single Plane A2C EF: 36.8 %
Single Plane A4C EF: 34.1 %

## 2021-08-19 NOTE — Progress Notes (Signed)
?  Echocardiogram ?2D Echocardiogram has been performed. ? ?Lawrence [name] ?08/19/2021, 11:48 AM ?

## 2021-08-27 ENCOUNTER — Other Ambulatory Visit: Payer: Self-pay

## 2021-08-27 ENCOUNTER — Encounter: Payer: Self-pay | Admitting: Cardiovascular Disease

## 2021-08-27 ENCOUNTER — Ambulatory Visit (INDEPENDENT_AMBULATORY_CARE_PROVIDER_SITE_OTHER): Payer: PPO | Admitting: Cardiovascular Disease

## 2021-08-27 VITALS — BP 84/54 | HR 82 | Ht 68.0 in | Wt 182.0 lb

## 2021-08-27 DIAGNOSIS — I420 Dilated cardiomyopathy: Secondary | ICD-10-CM | POA: Diagnosis not present

## 2021-08-27 DIAGNOSIS — I252 Old myocardial infarction: Secondary | ICD-10-CM | POA: Diagnosis not present

## 2021-08-27 DIAGNOSIS — I2510 Atherosclerotic heart disease of native coronary artery without angina pectoris: Secondary | ICD-10-CM | POA: Diagnosis not present

## 2021-08-27 DIAGNOSIS — I10 Essential (primary) hypertension: Secondary | ICD-10-CM | POA: Diagnosis not present

## 2021-08-27 DIAGNOSIS — E785 Hyperlipidemia, unspecified: Secondary | ICD-10-CM

## 2021-08-27 DIAGNOSIS — I255 Ischemic cardiomyopathy: Secondary | ICD-10-CM

## 2021-08-27 NOTE — Patient Instructions (Signed)
Medication Instructions:  ?No changes ? ?*If you need a refill on your cardiac medications before your next appointment, please call your pharmacy* ? ? ?Lab Work: ?none ?If you have labs (blood work) drawn today and your tests are completely normal, you will receive your results only by: ?MyChart Message (if you have MyChart) OR ?A paper copy in the mail ?If you have any lab test that is abnormal or we need to change your treatment, we will call you to review the results. ? ? ?Testing/Procedures: ?none ? ? ?Follow-Up: ?At [facility], you and your health needs are our priority.  As part of our continuing mission to provide you with exceptional heart care, we have created designated Provider Care Teams.  These Care Teams include your primary Cardiologist (physician) and Advanced Practice Providers (APPs -  Physician Assistants and Nurse Practitioners) who all work together to provide you with the care you need, when you need it. ? ?We recommend signing up for the patient portal called "MyChart".  Sign up information is provided on this After Visit Summary.  MyChart is used to connect with patients for Virtual Visits (Telemedicine).  Patients are able to view lab/test results, encounter notes, upcoming appointments, etc.  Non-urgent messages can be sent to your provider as well.   ?To learn more about what you can do with MyChart, go to [email or web address].   ? ?Your next appointment:   ?12 month(s) ? ?The format for your next appointment:   ?In Person ? ?Provider:   ?Lauree Chandler, MD   ? ? ?Other Instructions ?Per Dr. Angelena Form, you can return your Lifevest!  ?  ?

## 2021-08-27 NOTE — Progress Notes (Signed)
? ?Chief Complaint  ?Patient presents with  ? Follow-up  ?  CAD  ? ?History of Present Illness:56 yo male with history of HTN, HLD, DM, ischemic cardiomyopathy,  and CAD here today for follow up of his CAD. He was admitted to [facility] in September 2022 with an acute anterior STEMI. He was found to have severe disease in the mid LAD and in the obtuse marginal branch. Both were treated with drug eluting stents. LVEF below 35% post MI. He was discharged with a LIfevest. He has been followed in the Advanced Heart Failure clinic by Dr. Aundra Dubin. Most recent echo 08/19/21 with LVEF=30-35% and anterior/apical segments are severely hypokinetic. He was seen by Dr. Aundra Dubin last in February 2023.  ICD has been recommended. He continues to wear the LIfevest.  ? ?He is here today for follow up. The patient denies any chest pain, dyspnea, palpitations, lower extremity edema, orthopnea, PND, dizziness, near syncope or syncope. He does not wish to have an ICD.  ? ?Primary Care Physician: Melony Overly, MD ? ? ?Past Medical History:  ?Diagnosis Date  ? Diabetes mellitus without complication ([place])   ? HTN (hypertension)   ? Hyperlipidemia   ? ? ?Past Surgical History:  ?Procedure Laterality Date  ? CORONARY STENT INTERVENTION N/A 02/12/2021  ? Procedure: CORONARY STENT INTERVENTION;  Surgeon: Burnell Blanks, MD;  Location: [facility];  Service: Cardiovascular;  Laterality: N/A;  ? CORONARY/GRAFT ACUTE MI REVASCULARIZATION N/A 02/12/2021  ? Procedure: Coronary/Graft Acute MI Revascularization;  Surgeon: Burnell Blanks, MD;  Location: [facility];  Service: Cardiovascular;  Laterality: N/A;  ? EXTERNAL FIXATION LEG Right 02/27/2016  ? Procedure: EXTERNAL FIXATION LEG;  Surgeon: Netta Cedars, MD;  Location: [facility];  Service: Orthopedics;  Laterality: Right;  reduction and external fixation right tibial plateau fracture  ? EXTERNAL FIXATION REMOVAL Right 03/04/2016  ? Procedure: REMOVAL EXTERNAL FIXATION LEG;   Surgeon: Altamese Venice, MD;  Location: [facility];  Service: Orthopedics;  Laterality: Right;  ? HARVEST BONE GRAFT Left 03/04/2016  ? Procedure: Rimmed IM aspirate;  Surgeon: Altamese [place], MD;  Location: [facility];  Service: Orthopedics;  Laterality: Left;  ? LEFT HEART CATH AND CORONARY ANGIOGRAPHY N/A 02/12/2021  ? Procedure: LEFT HEART CATH AND CORONARY ANGIOGRAPHY;  Surgeon: Burnell Blanks, MD;  Location: [facility];  Service: Cardiovascular;  Laterality: N/A;  ? ORIF TIBIA PLATEAU Right 03/04/2016  ? Procedure: OPEN REDUCTION INTERNAL FIXATION (ORIF) TIBIAL PLATEAU;  Surgeon: Altamese [place], MD;  Location: [facility];  Service: Orthopedics;  Laterality: Right;  ? ? ?Current Outpatient Medications  ?Medication Sig Dispense Refill  ? albuterol (VENTOLIN HFA) 108 (90 Base) MCG/ACT inhaler as needed.    ? aspirin 81 MG chewable tablet Chew 1 tablet (81 mg total) by mouth daily. 90 tablet 2  ? blood glucose meter kit and supplies KIT Dispense based on patient and insurance preference. Use up to four times daily as directed. (FOR ICD-9 250.00, 250.01). 1 each 0  ? carvedilol (COREG) 12.5 MG tablet Take 1 tablet (12.5 mg total) by mouth 2 (two) times daily with a meal. 60 tablet 3  ? empagliflozin (JARDIANCE) 25 MG TABS tablet Take 1 tablet (25 mg total) by mouth daily. 30 tablet 11  ? FREESTYLE PRECISION NEO TEST test strip USE 1 STRIP TO CHECK GLUCOSE ONCE DAILY    ? metFORMIN (GLUCOPHAGE-XR) 500 MG 24 hr tablet Take 1,000 mg by mouth 2 (two) times daily.    ?  nitroGLYCERIN (NITROSTAT) 0.4 MG SL tablet Place 1 tablet (0.4 mg total) under the tongue every 5 (five) minutes as needed for chest pain. 25 tablet 2  ? omega-3 acid ethyl esters (LOVAZA) 1 g capsule Take by mouth 2 (two) times daily.    ? rosuvastatin (CRESTOR) 40 MG tablet Take 1 tablet (40 mg total) by mouth daily. 90 tablet 1  ? sacubitril-valsartan (ENTRESTO) 97-103 MG Take 1 tablet by mouth 2 (two) times daily. 180 tablet 3  ? spironolactone (ALDACTONE)  25 MG tablet Take 1 tablet (25 mg total) by mouth daily. 90 tablet 3  ? ticagrelor (BRILINTA) 90 MG TABS tablet Take 1 tablet (90 mg total) by mouth 2 (two) times daily. 180 tablet 2  ? ?No current facility-administered medications for this visit.  ? ? ?No Known Allergies ? ?Social History  ? ?Socioeconomic History  ? Marital status: Married  ?  Spouse name: Masai Kidd  ? Number of children: 3  ? Years of education: Not on file  ? Highest education level: High school graduate  ?Occupational History  ? Occupation: disability  ?Tobacco Use  ? Smoking status: Never  ? Smokeless tobacco: Never  ?Vaping Use  ? Vaping Use: Never used  ?Substance and Sexual Activity  ? Alcohol use: Not Currently  ? Drug use: No  ? Sexual activity: Yes  ?Other Topics Concern  ? Not on file  ?Social History Narrative  ? Not on file  ? ?Social Determinants of Health  ? ?Financial Resource Strain: High Risk  ? Difficulty of Paying Living Expenses: Hard  ?Food Insecurity: Food Insecurity Present  ? Worried About Charity fundraiser in the Last Year: Sometimes true  ? Ran Out of Food in the Last Year: Never true  ?Transportation Needs: No Transportation Needs  ? Lack of Transportation (Medical): No  ? Lack of Transportation (Non-Medical): No  ?Physical Activity: Not on file  ?Stress: Not on file  ?Social Connections: Not on file  ?Intimate Partner Violence: Not on file  ? ? ?Family History  ?Problem Relation Age of Onset  ? Cancer Mother   ? Cancer Father   ? ? ?Review of Systems:  As stated in the HPI and otherwise negative.  ? ?BP (!) 84/54   Pulse 82   Ht 5' 8"  (1.727 m)   Wt 182 lb (82.6 kg)   SpO2 99%   BMI 27.67 kg/m?  ? ?Physical Examination: ?General: Well developed, well nourished, NAD  ?HEENT: OP clear, mucus membranes moist  ?SKIN: warm, dry. No rashes. ?Neuro: No focal deficits  ?Musculoskeletal: Muscle strength 5/5 all ext  ?Psychiatric: Mood and affect normal  ?Neck: No JVD, no carotid bruits, no thyromegaly, no  lymphadenopathy.  ?Lungs:Clear bilaterally, no wheezes, rhonci, crackles ?Cardiovascular: Regular rate and rhythm. No murmurs, gallops or rubs. ?Abdomen:Soft. Bowel sounds present. Non-tender.  ?Extremities: No lower extremity edema. Pulses are 2 + in the bilateral DP/PT. ? ?EKG:  EKG is not ordered today. ?The ekg ordered today demonstrates  ? ?Echo 08/19/21: ? 1. Left ventricular ejection fraction, by estimation, is 30 to 35%. Left  ?ventricular ejection fraction by 3D volume is 35 %. The left ventricle has  ?moderately decreased function. The left ventricle demonstrates regional  ?wall motion abnormalities (see  ?scoring diagram/findings for description). All septal segments and apex  ?are severely hypokinetic. The inferior and mid-to-apical anterior segments  ?are moderately hypokinetic. Left ventricular diastolic parameters are  ?consistent with Grade I diastolic  ?dysfunction (impaired relaxation).  The average left ventricular global  ?longitudinal strain is -12.1 %. The global longitudinal strain is  ?abnormal.  ? 2. Right ventricular systolic function is normal. The right ventricular  ?size is normal. Tricuspid regurgitation signal is inadequate for assessing  ?PA pressure.  ? 3. The mitral valve is normal in structure. Trivial mitral valve  ?regurgitation.  ? 4. The aortic valve is tricuspid. Aortic valve regurgitation is trivial.  ?Aortic valve sclerosis is present, with no evidence of aortic valve  ?stenosis.  ? 5. Aortic dilatation noted. There is borderline dilatation of the  ?ascending aorta, measuring 36 mm.  ? 6. The inferior vena cava is normal in size with greater than 50%  ?respiratory variability, suggesting right atrial pressure of 3 mmHg.  ? ?Recent Labs: ?02/24/2021: Hemoglobin 13.5; Platelets 281 ?04/22/2021: ALT 12 ?05/19/2021: B Natriuretic Peptide 84.8 ?06/03/2021: BUN 26; Creatinine, Ser 1.22; Potassium 4.6; Sodium 136  ? ?Lipid Panel ?   ?Component Value Date/Time  ? CHOL 135 04/22/2021  0940  ? TRIG 186 (H) 04/22/2021 0940  ? HDL 27 (L) 04/22/2021 0940  ? CHOLHDL 5.0 04/22/2021 0940  ? CHOLHDL 7.6 02/12/2021 1817  ? VLDL 24 02/12/2021 1817  ? [place] 76 04/22/2021 0940  ? ?  ?Wt Readings from Last 3 Encount

## 2021-08-31 ENCOUNTER — Telehealth: Payer: Self-pay

## 2021-08-31 NOTE — Telephone Encounter (Signed)
? ?  Pre-operative Risk Assessment  ?  ?Patient Name: Lawrence [name]  ?DOB: 06/04/1966 ?MRN: [id number]  ? ?  ? ?Request for Surgical Clearance   ? ?Procedure:   VITRECTOMY W/MEMBRANCE PEELING-RIGHT  ? ?Date of Surgery:  Clearance TBD                              ?   ?Surgeon:  DR. Jacqualyn Posey PECEN ?Surgeon's Group or Practice Name:  [facility] ?Phone number:  [phone] [phone] ?Fax number:  [phone] ?  ?Type of Clearance Requested:   ?- Pharmacy:  Hold Aspirin INSTRUCTIONS WHEN TO HOLD ?  ?Type of Anesthesia:   IV SEDATION ?  ?Additional requests/questions:   ? ?Signed, ?Jacinta Shoe   ?08/31/2021, 4:29 PM  ? ?

## 2021-09-01 NOTE — Telephone Encounter (Signed)
Noted. Will route to Dr. Angelena Form for review since he saw patient just on 08/27/21 [facility] is seeking cardiac clearance for vitrectomy with membrane peeling - right, under IV sedation. Likely low risk procedure however patient's BP was low at recent OV so needed MD input. They do not need to hold blood thinners as originally thought. Please route response to P CV DIV PREOP (the pre-op pool). Thank you. ? ?

## 2021-09-01 NOTE — Telephone Encounter (Signed)
? ?  Patient Name: Lawrence [name]  ?DOB: 17-Sep-1965 ?MRN: [id number] ? ?Primary Cardiologist: Lauree Chandler, MD ? ?Chart reviewed as part of pre-operative protocol coverage. Patient was just recently seen by Dr. Angelena Form in clinic 08/27/21, excellent note reviewed. Blood pressure was soft but no changes were recommended at that visit. He had anterior STEMI 02/2021 s/p DES to LAD and OM with recommendation for dual antiplatelet therapy uninterrupted for 12 months.  ? ?The clearance below for vitrectomy requests to hold ASA but I do suspect they will need to hold Brilinta as well. I will route to callback to inquire 2 questions: ?A) whether they need to hold Brilinta or if it was intentional that only ASA was listed and they can continue Brilinta and  ?B) whether surgery is urgent as the preference would be to continue uninterrupted blood thinner therapy for 12 months post MI (after 02/2022) ? ?[name], PA-C ?09/01/2021, 11:54 AM ? ? ?

## 2021-09-01 NOTE — Telephone Encounter (Signed)
Left message for Rinaldo Cloud, NP for Dr. Stacie Glaze. Left message to call back [phone] for the pre op call back dept.  I will fax these notes over to requesting office as well for NP to review and call back with recommendations. ? ?Rinaldo Cloud, NP called back and stated that no blood thinners or ASA are needing to be held. I apologized for our error. I assured Caryl Pina that I will have the pre op review for final clearance notes as we know that no medications are needing to be held. Once cleared we will fax clearance notes. Caryl Pina thanked me for the call and the help today.  ?

## 2021-09-01 NOTE — Telephone Encounter (Signed)
? ?  Patient Name: Lawrence [name]  ?DOB: 09/20/1965 ?MRN: [id number] ? ?Primary Cardiologist: Lauree Chandler, MD ? ?Chart reviewed as part of pre-operative protocol coverage. Per Dr. Angelena Form, [place] to proceed with his low risk eye procedure. As clarified below, eye office confirmed they did not need to hold blood thinners for procedure. ? ?Will route this bundled recommendation to requesting provider via Epic fax function. Please call with questions. ? ? ?[name], PA-C ?09/01/2021, 3:58 PM ? ? ?

## 2021-09-24 DIAGNOSIS — H35371 Puckering of macula, right eye: Secondary | ICD-10-CM | POA: Diagnosis not present

## 2021-10-01 ENCOUNTER — Other Ambulatory Visit (HOSPITAL_COMMUNITY): Payer: Self-pay

## 2021-10-01 MED ORDER — TICAGRELOR 90 MG PO TABS: 90.0000 mg | ORAL_TABLET | Freq: Two times a day (BID) | ORAL | 2 refills | Status: DC

## 2021-10-05 ENCOUNTER — Encounter (HOSPITAL_COMMUNITY): Payer: Self-pay | Admitting: Cardiology

## 2021-10-05 ENCOUNTER — Ambulatory Visit (HOSPITAL_COMMUNITY)
Admission: RE | Admit: 2021-10-05 | Discharge: 2021-10-05 | Disposition: A | Discharge status: Home / Self Care | Payer: PPO | Source: Ambulatory Visit | Attending: Cardiology | Admitting: Cardiology

## 2021-10-05 VITALS — BP 120/78 | HR 80 | Wt 190.6 lb

## 2021-10-05 DIAGNOSIS — I5022 Chronic systolic (congestive) heart failure: Secondary | ICD-10-CM | POA: Insufficient documentation

## 2021-10-05 DIAGNOSIS — I255 Ischemic cardiomyopathy: Secondary | ICD-10-CM

## 2021-10-05 DIAGNOSIS — E782 Mixed hyperlipidemia: Secondary | ICD-10-CM

## 2021-10-05 DIAGNOSIS — I110 Hypertensive heart disease with heart failure: Secondary | ICD-10-CM | POA: Diagnosis not present

## 2021-10-05 DIAGNOSIS — E785 Hyperlipidemia, unspecified: Secondary | ICD-10-CM | POA: Diagnosis not present

## 2021-10-05 DIAGNOSIS — Z79899 Other long term (current) drug therapy: Secondary | ICD-10-CM | POA: Insufficient documentation

## 2021-10-05 DIAGNOSIS — I2510 Atherosclerotic heart disease of native coronary artery without angina pectoris: Secondary | ICD-10-CM

## 2021-10-05 DIAGNOSIS — I252 Old myocardial infarction: Secondary | ICD-10-CM | POA: Diagnosis not present

## 2021-10-05 DIAGNOSIS — Z955 Presence of coronary angioplasty implant and graft: Secondary | ICD-10-CM | POA: Diagnosis not present

## 2021-10-05 DIAGNOSIS — E11319 Type 2 diabetes mellitus with unspecified diabetic retinopathy without macular edema: Secondary | ICD-10-CM | POA: Insufficient documentation

## 2021-10-05 DIAGNOSIS — Z7984 Long term (current) use of oral hypoglycemic drugs: Secondary | ICD-10-CM | POA: Diagnosis not present

## 2021-10-05 DIAGNOSIS — Z7982 Long term (current) use of aspirin: Secondary | ICD-10-CM | POA: Insufficient documentation

## 2021-10-05 LAB — LIPID PANEL
Cholesterol: 160 mg/dL (ref 0–200)
HDL: 38 mg/dL — ABNORMAL LOW (ref >40)
LDL Cholesterol: 92 mg/dL (ref 0–99)
Total CHOL/HDL Ratio: 4.2 RATIO
Triglycerides: 150 mg/dL — ABNORMAL HIGH (ref <150)
VLDL: 30 mg/dL (ref 0–40)

## 2021-10-05 LAB — BASIC METABOLIC PANEL
Anion gap: 5 (ref 5–15)
BUN: 30 mg/dL — ABNORMAL HIGH (ref 6–20)
CO2: 24 mmol/L (ref 22–32)
Calcium: 9.2 mg/dL (ref 8.9–10.3)
Chloride: 105 mmol/L (ref 98–111)
Creatinine, Ser: 1.02 mg/dL (ref 0.61–1.24)
GFR, Estimated: >60 mL/min (ref >60)
Glucose, Bld: 150 mg/dL — ABNORMAL HIGH (ref 70–99)
Potassium: 5.1 mmol/L (ref 3.5–5.1)
Sodium: 134 mmol/L — ABNORMAL LOW (ref 135–145)

## 2021-10-05 MED ORDER — CARVEDILOL 12.5 MG PO TABS: 18.7500 mg | ORAL_TABLET | Freq: Two times a day (BID) | ORAL | 11 refills | Status: DC

## 2021-10-05 NOTE — Patient Instructions (Signed)
Medication Changes: ? ?Increase 18.'75mg'$  Twice daily ? ? ?Lab Work: ? ?Labs done today, your results will be available in MyChart, we will contact you for abnormal readings. ? ? ?Testing/Procedures: ? ?none ? ?Referrals: ? ?none ? ?Special Instructions // Education: ? ?none ? ?Follow-Up in: 3 months  ? ?At the [facility], you and your health needs are our priority. We have a designated team specialized in the treatment of Heart Failure. This Care Team includes your primary Heart Failure Specialized Cardiologist (physician), Advanced Practice Providers (APPs- Physician Assistants and Nurse Practitioners), and Pharmacist who all work together to provide you with the care you need, when you need it.  ? ?You may see any of the following providers on your designated Care Team at your next follow up: ? ?Dr Glori Bickers ?Dr Loralie Champagne ?Darrick Grinder, NP ?Lyda Jester, PA ?Jessica Milford,NP ?Marlyce Huge, PA ?Audry Riles, PharmD ? ? ?Please be sure to bring in all your medications bottles to every appointment.  ? ?Need to Contact us: ? ?If you have any questions or concerns before your next appointment please send [place] a message through [place] or call our office at [phone].   ? ?TO LEAVE A MESSAGE FOR THE NURSE SELECT OPTION 2, PLEASE LEAVE A MESSAGE INCLUDING: ?YOUR NAME ?DATE OF BIRTH ?CALL BACK NUMBER ?REASON FOR CALL**this is important as we prioritize the call backs ? ?YOU WILL RECEIVE A CALL BACK THE SAME DAY AS LONG AS YOU CALL BEFORE 4:00 PM ? ? ?

## 2021-10-05 NOTE — Progress Notes (Signed)
? ?ADVANCED HF CLINIC CONSULT NOTE ? ?Primary Care: Melony Overly, MD ?HF Cardiologist: Dr. Aundra Dubin ?  ?HPI: ?  ?Lawrence [name] is a 56 y.o. Hispanic male with a history of chronic knee pain from crush injury, DM, HTN, hyperlipidemia, CAD, ischemic cardiomyopathy.  ?  ?Admitted 02/12/2021 with chest pain in the setting of STEMI. Had [place] and underwent successful PTCA/DES x 1 mid LAD and successful PTCA/DES x 1 obtuse marginal.  Echo in 9/22 showed EF 30-35%. CMRI with EF 31% and unable to assess viability due to acute MI, no thrombus. Discharged with LifeVest. Discharged on losartan, coreg, jardiance, and spironolactone.   ?  ?Echo in 12/22 showed EF 30-35%, septal/apical severe hypokinesis, normal RV.  Patient wanted to wait a few more months and repeat echo before committing to ICD.  Repeat echo in 3/23 with EF still 350-35%, septal/apical severe HK, RV normal.  Patient decided he did not want ICD and took off [place].  ? ?Patient returns for followup of CHF and CAD.  No chest pain, no significant exertional dyspnea.  He has been very active, walking for exercise.  Still using cane for balance due to prior patellar injury.  No orthopnea/PND.  No palpitations.  ? ?ECG (personally reviewed): NSR, PVC, nonspecific T wave changes ? ?Labs (10/22): K 4.6, creatinine 0.99 ?Labs (11/22): LDL 76, TGs 186 ?Labs (12/22): BNP 85, K 4.6, creatinine 1.22 ?Labs (3/23): K 4.4, creatinine 0.93, hgb 12.3 ?  ?SH: Lives with his wife and child. Disabled from previous knee injury. He does not drink alcohol. He does not smoke.  ? ?PMH: ?1. Type 2 diabetes ?2. HTN ?3. Hyperlipidemia ?4. CAD: Anterior MI in 9/22 with DES to mLAD and DES to OM2.  ?5. Chronic systolic CHF: Ischemic cardiomyopathy.   ?- Echo (9/22): EF 30-35%, normal RV.  ?- Cardiac MRI (9/22): No thrombus. LGE consistent with myocardial infarction in portion of the mid anterior/anteroseptal ?Unable to assess for viability in setting of acute MI (LGE overestimates final infarct  size in setting of acute MI). EF 31%. Akinesis of mid anterior/anteroseptal walls, apical anterior/septal/inferior walls, and apex. RV normal.  ?- Echo (12/22): EF 30-35%, septal/apical severe hypokinesis, normal RV. ?6. Diabetic retinopathy ?  ?Review of Systems: All systems reviewed and negative except as per HPI.  ? ?Current Outpatient Medications  ?Medication Sig Dispense Refill  ? albuterol (VENTOLIN HFA) 108 (90 Base) MCG/ACT inhaler as needed.    ? aspirin 81 MG chewable tablet Chew 1 tablet (81 mg total) by mouth daily. 90 tablet 2  ? blood glucose meter kit and supplies KIT Dispense based on patient and insurance preference. Use up to four times daily as directed. (FOR ICD-9 250.00, 250.01). 1 each 0  ? empagliflozin (JARDIANCE) 25 MG TABS tablet Take 1 tablet (25 mg total) by mouth daily. 30 tablet 11  ? FREESTYLE PRECISION NEO TEST test strip USE 1 STRIP TO CHECK GLUCOSE ONCE DAILY    ? metFORMIN (GLUCOPHAGE-XR) 500 MG 24 hr tablet Take 1,000 mg by mouth 2 (two) times daily.    ? nitroGLYCERIN (NITROSTAT) 0.4 MG SL tablet Place 1 tablet (0.4 mg total) under the tongue every 5 (five) minutes as needed for chest pain. 25 tablet 2  ? rosuvastatin (CRESTOR) 40 MG tablet Take 1 tablet (40 mg total) by mouth daily. 90 tablet 1  ? sacubitril-valsartan (ENTRESTO) 97-103 MG Take 1 tablet by mouth 2 (two) times daily. 180 tablet 3  ? spironolactone (ALDACTONE) 25 MG tablet Take  1 tablet (25 mg total) by mouth daily. 90 tablet 3  ? ticagrelor (BRILINTA) 90 MG TABS tablet Take 1 tablet (90 mg total) by mouth 2 (two) times daily. 90 tablet 2  ? carvedilol (COREG) 12.5 MG tablet Take 1.5 tablets (18.75 mg total) by mouth 2 (two) times daily with a meal. 60 tablet 11  ? omega-3 acid ethyl esters (LOVAZA) 1 g capsule Take by mouth 2 (two) times daily. (Patient not taking: Reported on 10/05/2021)    ? ?No current facility-administered medications for this encounter.  ? ?No Known Allergies ? ? ?Family History  ?Problem  Relation Age of Onset  ? Cancer Mother   ? Cancer Father   ? ?BP 120/78   Pulse 80   Wt 86.5 kg (190 lb 9.6 oz)   SpO2 100%   BMI 28.98 kg/m?  ? ?Wt Readings from Last 3 Encounters:  ?10/05/21 86.5 kg (190 lb 9.6 oz)  ?08/27/21 82.6 kg (182 lb)  ?07/23/21 83.1 kg (183 lb 3.2 oz)  ? ?PHYSICAL EXAM: ?General: NAD ?Neck: No JVD, no thyromegaly or thyroid nodule.  ?Lungs: Clear to auscultation bilaterally with normal respiratory effort. ?CV: Nondisplaced PMI.  Heart regular S1/S2, no S3/S4, no murmur.  No peripheral edema.  No carotid bruit.  Normal pedal pulses.  ?Abdomen: Soft, nontender, no hepatosplenomegaly, no distention.  ?Skin: Intact without lesions or rashes.  ?Neurologic: Alert and oriented x 3.  ?Psych: Normal affect. ?Extremities: No clubbing or cyanosis.  ?HEENT: Normal.  ? ?ASSESSMENT & PLAN: ?1. Chronic systolic CHF:  Ischemic cardiomyopathy.  Echo in 9/22 with EF 30-35%.  Repeat echo in 12/22 showed EF still in the 30-35% range with normal RV.  Repeat echo in 3/23 with EF still 30-35%.  He decided against ICD placement after extensive discussion and Lifevest is now off.  He is not volume overloaded on exam.  NYHA class I-II.  ?- Continue Entresto 97/103 bid.  ?- Continue spironolactone 25 mg daily.  BMET today.  ?- Increase Coreg to 18.75 mg bid.  ?- Continue Jardiance 10 mg daily. ?2. CAD: Anterior STEMI in 9/22 with severe mLAD stenosis treated with PCI/DES x1, and severe OM stenosis treated with PCI/DES x1, non dominant RCA with mild non-obstructive disease.  No chest pain.   ?- Will need DAPT with ASA/Brilinta for one year. ?- Continue Crestor 40 mg daily, check lipids today. ?3. HTN: BP controlled.  ?4. Hyperlipidemia: Continue statin.  Check lipids today.  ?5. DMII: Per PCP ?6. Diabetic retinopathy: Patient needs an eye surgery, he is not sure about the details.  If urgent, he has been on ASA/ticagrelor for > 6 months and could hold for procedure.  If not urgent, would be ideal to continue  DAPT for 1 year prior to holding for procedure.  ? ?Followup 3 months with APP.   ? ?Loralie Champagne ?10/05/2021 ? ? ?

## 2021-10-07 ENCOUNTER — Telehealth (HOSPITAL_COMMUNITY): Payer: Self-pay | Admitting: Surgery

## 2021-10-07 ENCOUNTER — Telehealth (HOSPITAL_COMMUNITY): Payer: Self-pay | Admitting: *Deleted

## 2021-10-07 DIAGNOSIS — E782 Mixed hyperlipidemia: Secondary | ICD-10-CM

## 2021-10-07 NOTE — Telephone Encounter (Signed)
Patient called to review results and recommendations per provider.  I left a message for a return call. ?

## 2021-10-07 NOTE — Telephone Encounter (Signed)
-----   Message from Larey Dresser, MD sent at 10/05/2021 10:08 PM EDT ----- ?With CAD, LDL needs to be < 55.  Refer to lipid clinic to add Repatha.  ?

## 2021-11-05 ENCOUNTER — Ambulatory Visit (INDEPENDENT_AMBULATORY_CARE_PROVIDER_SITE_OTHER): Payer: PPO | Admitting: Pharmacist Clinician (PhC)/ Clinical Pharmacy Specialist

## 2021-11-05 ENCOUNTER — Encounter: Payer: Self-pay | Admitting: Pharmacist Clinician (PhC)/ Clinical Pharmacy Specialist

## 2021-11-05 DIAGNOSIS — E782 Mixed hyperlipidemia: Secondary | ICD-10-CM

## 2021-11-05 NOTE — Patient Instructions (Signed)
Your Results:             Your most recent labs Goal  Total Cholesterol 160 < 200  Triglycerides 150 < 150  HDL (happy/good cholesterol) 38 > 40  LDL (lousy/bad cholesterol 92 < 55   Medication changes:  We will start the process to get Repatha covered by your insurance.  Once the prior authorization is complete, [facility] Silos will call you to let you know and confirm pharmacy information.   You will take one injection every 14 days.   Lab orders:  We want to repeat labs after 2-3 months.  We will send you a lab order to remind you once we get closer to that time.    Patient Assistance:  The [employer] foundation offers assistance to help pay for medication copays.  They will cover copays for all cholesterol lowering meds, including statins, fibrates, omega-3 oils, ezetimibe, Repatha, Praluent, Nexletol, Nexlizet.  The cards are usually good for $2,500 or 12 months, whichever comes first. Go to healthwellfoundation.org Click on "Apply Now" Answer questions as to whom is applying (patient or representative) Your disease fund will be "hypercholesterolemia - Medicare access" They will ask questions about finances and which medications you are taking for cholesterol When you submit, the approval is usually within minutes.  You will need to print the card information from the site You will need to show this information to your pharmacy, they will bill your Medicare Part D plan first -then bill [employer] Well --for the copay.   You can also call them at [phone], although the hold times can be quite long.   Thank you for choosing [facility]

## 2021-11-05 NOTE — Progress Notes (Unsigned)
/11/13/2021 [facility] 02-Sep-1965 [id number]   HPI:  Lawrence [name] is a 56 y.o. male patient of Dr Aundra Dubin, who presents today for a lipid clinic evaluation.  See pertinent past medical history below.  Lawrence [name] had a STEMI last September with placement of DES x 2.  EF at time of event was 30-35%.  He is currently tolerating high intensity rosuvastatin without difficulty, however his LDL goal is now < 55.  This will not be obtainable with simply adding ezetimibe 10 mg.    Past Medical History: ASCVD 9/22 - s/p STEMI w/DES to mid LAD, obtuse marginal  hypertension Controlled with current medications  CHF 9/22 echo EF 30-35% - on Entresto, carvedilol, spironolactone, empagliflozin  DM2 3/23 A1c 6.7 on empagliflozin, metformin; diabetic retinopathy   Current Medications: rosuvastatin 40  Cholesterol Goals: LDL < 55  Family history: parents, siblings w/o heart issues; 3 children healthy  Diet: switched from fried rice to steamed, now grilled, steamed vegetables, no fried food, rarely eats out; tries to avoid too much meat, limits to 3 times per week, especially beef; eats eggs most morning  Exercise:  walks 30 min most days   Labs:  10/05/21:  TC 160, TG 150, HDL 38, LDL 92   Current Outpatient Medications  Medication Sig Dispense Refill   albuterol (VENTOLIN HFA) 108 (90 Base) MCG/ACT inhaler as needed.     aspirin 81 MG chewable tablet Chew 1 tablet (81 mg total) by mouth daily. 90 tablet 2   blood glucose meter kit and supplies KIT Dispense based on patient and insurance preference. Use up to four times daily as directed. (FOR ICD-9 250.00, 250.01). 1 each 0   carvedilol (COREG) 12.5 MG tablet Take 1.5 tablets (18.75 mg total) by mouth 2 (two) times daily with a meal. 60 tablet 11   empagliflozin (JARDIANCE) 25 MG TABS tablet Take 1 tablet (25 mg total) by mouth daily. 30 tablet 11   FREESTYLE PRECISION NEO TEST test strip USE 1 STRIP TO CHECK GLUCOSE ONCE DAILY     metFORMIN  (GLUCOPHAGE-XR) 500 MG 24 hr tablet Take 1,000 mg by mouth 2 (two) times daily.     nitroGLYCERIN (NITROSTAT) 0.4 MG SL tablet Place 1 tablet (0.4 mg total) under the tongue every 5 (five) minutes as needed for chest pain. 25 tablet 2   rosuvastatin (CRESTOR) 40 MG tablet Take 1 tablet (40 mg total) by mouth daily. 90 tablet 1   sacubitril-valsartan (ENTRESTO) 97-103 MG Take 1 tablet by mouth 2 (two) times daily. 180 tablet 3   spironolactone (ALDACTONE) 25 MG tablet Take 1 tablet (25 mg total) by mouth daily. 90 tablet 3   ticagrelor (BRILINTA) 90 MG TABS tablet Take 1 tablet (90 mg total) by mouth 2 (two) times daily. 90 tablet 2   TRULANCE 3 MG TABS Take 1 tablet by mouth daily.     No current facility-administered medications for this visit.    No Known Allergies  Past Medical History:  Diagnosis Date   Diabetes mellitus without complication (HCC)    HTN (hypertension)    Hyperlipidemia     Blood pressure 112/72, pulse 77, resp. rate 17, height _0  (1.727 m), weight 188 lb (85.3 kg), SpO2 95 %.   Hyperlipidemia Patient with ASCVD and LDL not at goal on high intensity statin.  Reviewed options for lowering LDL cholesterol, including ezetimibe, PCSK-9 inhibitors, bempedoic acid and inclisiran.  Discussed mechanisms of action, dosing, side effects and potential decreases in  LDL cholesterol.  Also reviewed cost information and potential options for patient assistance.  Answered all patient questions.  LDL currently at 92, compliant with rosuvastatin 40.  Ezetimibe will only give a 20% drop, still leaving him short of the < 55 goal.  Based on this information, patient would prefer to start PCSK9i.   We will start the process to get medication covered.  He will take Repatha 140 mg every 14 days and repeat labs after 4-6 doses.     Tommy Medal PharmD CPP [facility] [address] [place], [place] [address] [phone]

## 2021-11-10 DIAGNOSIS — I10 Essential (primary) hypertension: Secondary | ICD-10-CM | POA: Diagnosis not present

## 2021-11-10 DIAGNOSIS — K5909 Other constipation: Secondary | ICD-10-CM | POA: Diagnosis not present

## 2021-11-10 DIAGNOSIS — I252 Old myocardial infarction: Secondary | ICD-10-CM | POA: Diagnosis not present

## 2021-11-10 DIAGNOSIS — E785 Hyperlipidemia, unspecified: Secondary | ICD-10-CM | POA: Diagnosis not present

## 2021-11-10 DIAGNOSIS — I255 Ischemic cardiomyopathy: Secondary | ICD-10-CM | POA: Diagnosis not present

## 2021-11-10 DIAGNOSIS — Z79899 Other long term (current) drug therapy: Secondary | ICD-10-CM | POA: Diagnosis not present

## 2021-11-10 DIAGNOSIS — E1169 Type 2 diabetes mellitus with other specified complication: Secondary | ICD-10-CM | POA: Diagnosis not present

## 2021-11-13 NOTE — Assessment & Plan Note (Signed)
Patient with ASCVD and LDL not at goal on high intensity statin.  Reviewed options for lowering LDL cholesterol, including ezetimibe, PCSK-9 inhibitors, bempedoic acid and inclisiran.  Discussed mechanisms of action, dosing, side effects and potential decreases in LDL cholesterol.  Also reviewed cost information and potential options for patient assistance.  Answered all patient questions.  LDL currently at 92, compliant with rosuvastatin 40.  Ezetimibe will only give a 20% drop, still leaving him short of the < 55 goal.  Based on this information, patient would prefer to start PCSK9i.   We will start the process to get medication covered.  He will take Repatha 140 mg every 14 days and repeat labs after 4-6 doses.

## 2021-11-17 ENCOUNTER — Telehealth: Payer: Self-pay | Admitting: Pharmacist Clinician (PhC)/ Clinical Pharmacy Specialist

## 2021-11-17 DIAGNOSIS — E782 Mixed hyperlipidemia: Secondary | ICD-10-CM

## 2021-11-17 MED ORDER — REPATHA SURECLICK 140 MG/ML ~~LOC~~ SOAJ: 140.0000 mg | SUBCUTANEOUS | 12 refills | Status: DC

## 2021-11-17 NOTE — Telephone Encounter (Signed)
Repatha approved 11/17/21  to 05/16/22  rx sent to [facility] after 4-6 doses

## 2021-11-25 DIAGNOSIS — R945 Abnormal results of liver function studies: Secondary | ICD-10-CM | POA: Diagnosis not present

## 2021-11-25 DIAGNOSIS — D539 Nutritional anemia, unspecified: Secondary | ICD-10-CM | POA: Diagnosis not present

## 2022-01-05 ENCOUNTER — Ambulatory Visit (HOSPITAL_COMMUNITY)
Admission: RE | Admit: 2022-01-05 | Discharge: 2022-01-05 | Disposition: A | Discharge status: Home / Self Care | Payer: PPO | Source: Ambulatory Visit | Attending: Family Medicine | Admitting: Family Medicine

## 2022-01-05 ENCOUNTER — Encounter (HOSPITAL_COMMUNITY): Payer: Self-pay

## 2022-01-05 VITALS — BP 110/70 | HR 79 | Wt 186.8 lb

## 2022-01-05 DIAGNOSIS — I252 Old myocardial infarction: Secondary | ICD-10-CM | POA: Insufficient documentation

## 2022-01-05 DIAGNOSIS — E11319 Type 2 diabetes mellitus with unspecified diabetic retinopathy without macular edema: Secondary | ICD-10-CM | POA: Diagnosis not present

## 2022-01-05 DIAGNOSIS — I5022 Chronic systolic (congestive) heart failure: Secondary | ICD-10-CM | POA: Diagnosis not present

## 2022-01-05 DIAGNOSIS — E113599 Type 2 diabetes mellitus with proliferative diabetic retinopathy without macular edema, unspecified eye: Secondary | ICD-10-CM

## 2022-01-05 DIAGNOSIS — I2510 Atherosclerotic heart disease of native coronary artery without angina pectoris: Secondary | ICD-10-CM | POA: Diagnosis not present

## 2022-01-05 DIAGNOSIS — Z79899 Other long term (current) drug therapy: Secondary | ICD-10-CM | POA: Diagnosis not present

## 2022-01-05 DIAGNOSIS — I110 Hypertensive heart disease with heart failure: Secondary | ICD-10-CM | POA: Diagnosis not present

## 2022-01-05 DIAGNOSIS — I255 Ischemic cardiomyopathy: Secondary | ICD-10-CM | POA: Diagnosis not present

## 2022-01-05 DIAGNOSIS — Z955 Presence of coronary angioplasty implant and graft: Secondary | ICD-10-CM | POA: Diagnosis not present

## 2022-01-05 DIAGNOSIS — E119 Type 2 diabetes mellitus without complications: Secondary | ICD-10-CM | POA: Diagnosis not present

## 2022-01-05 DIAGNOSIS — G8929 Other chronic pain: Secondary | ICD-10-CM | POA: Insufficient documentation

## 2022-01-05 DIAGNOSIS — E785 Hyperlipidemia, unspecified: Secondary | ICD-10-CM | POA: Diagnosis not present

## 2022-01-05 DIAGNOSIS — I10 Essential (primary) hypertension: Secondary | ICD-10-CM | POA: Diagnosis not present

## 2022-01-05 LAB — BASIC METABOLIC PANEL
Anion gap: 5 (ref 5–15)
BUN: 31 mg/dL — ABNORMAL HIGH (ref 6–20)
CO2: 23 mmol/L (ref 22–32)
Calcium: 9 mg/dL (ref 8.9–10.3)
Chloride: 107 mmol/L (ref 98–111)
Creatinine, Ser: 1.34 mg/dL — ABNORMAL HIGH (ref 0.61–1.24)
GFR, Estimated: >60 mL/min (ref >60)
Glucose, Bld: 147 mg/dL — ABNORMAL HIGH (ref 70–99)
Potassium: 4.7 mmol/L (ref 3.5–5.1)
Sodium: 135 mmol/L (ref 135–145)

## 2022-01-05 NOTE — Progress Notes (Signed)
ADVANCED HF CLINIC NOTE  Primary Care: Melony Overly, MD HF Cardiologist: Dr. Aundra Dubin   HPI: Lawrence [name] is a 56 y.o. Hispanic male with a history of chronic knee pain from crush injury, DM, HTN, hyperlipidemia, CAD, ischemic cardiomyopathy.    Admitted 02/12/2021 with chest pain in the setting of STEMI. Had [place] and underwent successful PTCA/DES x 1 mid LAD and successful PTCA/DES x 1 obtuse marginal.  Echo in 9/22 showed EF 30-35%. CMRI with EF 31% and unable to assess viability due to acute MI, no thrombus. Discharged with LifeVest. Discharged on losartan, coreg, jardiance, and spironolactone.     Echo in 12/22 showed EF 30-35%, septal/apical severe hypokinesis, normal RV.  Patient wanted to wait a few more months and repeat echo before committing to ICD.  Repeat echo in 3/23 with EF still 350-35%, septal/apical severe HK, RV normal.  Patient decided he did not want ICD and took off [place].   Follow up 5/23, NYHA I-II, not volume overloaded. Beta blocker increased and referred to Lipid Clinic for [place].  Today he returns for HF follow up. Overall feeling fine. Main complaint is constipation. Uses a cane for balance from a prior patellar injury. He is not short of breath with walking on flat ground or up steps. Denies palpitations, CP, dizziness, edema, or PND/Orthopnea. Appetite ok. No fever or chills. Weight at home stable. Taking all medications, has not started Repatha.  ECG (personally reviewed): none ordered today.  Labs (10/22): K 4.6, creatinine 0.99 Labs (11/22): LDL 76, TGs 186 Labs (12/22): BNP 85, K 4.6, creatinine 1.22 Labs (3/23): K 4.4, creatinine 0.93, hgb 12.3 Labs (5/23): K 5.1, creatinine 1.02, LDL 92, HDL 38, TGs 150   SH: Lives with his wife and child. Disabled from previous knee injury. He does not drink alcohol. He does not smoke.   PMH: 1. Type 2 diabetes 2. HTN 3. Hyperlipidemia 4. CAD: Anterior MI in 9/22 with DES to mLAD and DES to OM2.  5. Chronic  systolic CHF: Ischemic cardiomyopathy.   - Echo (9/22): EF 30-35%, normal RV.  - Cardiac MRI (9/22): No thrombus. LGE consistent with myocardial infarction in portion of the mid anterior/anteroseptal Unable to assess for viability in setting of acute MI (LGE overestimates final infarct size in setting of acute MI). EF 31%. Akinesis of mid anterior/anteroseptal walls, apical anterior/septal/inferior walls, and apex. RV normal.  - Echo (12/22): EF 30-35%, septal/apical severe hypokinesis, normal RV. 6. Diabetic retinopathy   Review of Systems: All systems reviewed and negative except as per HPI.   Current Outpatient Medications  Medication Sig Dispense Refill   albuterol (VENTOLIN HFA) 108 (90 Base) MCG/ACT inhaler as needed.     aspirin 81 MG chewable tablet Chew 1 tablet (81 mg total) by mouth daily. 90 tablet 2   blood glucose meter kit and supplies KIT Dispense based on patient and insurance preference. Use up to four times daily as directed. (FOR ICD-9 250.00, 250.01). 1 each 0   carvedilol (COREG) 12.5 MG tablet Take 1.5 tablets (18.75 mg total) by mouth 2 (two) times daily with a meal. 60 tablet 11   empagliflozin (JARDIANCE) 25 MG TABS tablet Take 1 tablet (25 mg total) by mouth daily. 30 tablet 11   FREESTYLE PRECISION NEO TEST test strip USE 1 STRIP TO CHECK GLUCOSE ONCE DAILY     metFORMIN (GLUCOPHAGE-XR) 500 MG 24 hr tablet Take 1,000 mg by mouth 2 (two) times daily.     nitroGLYCERIN (NITROSTAT) 0.4  MG SL tablet Place 1 tablet (0.4 mg total) under the tongue every 5 (five) minutes as needed for chest pain. 25 tablet 2   rosuvastatin (CRESTOR) 40 MG tablet Take 1 tablet (40 mg total) by mouth daily. 90 tablet 1   sacubitril-valsartan (ENTRESTO) 97-103 MG Take 1 tablet by mouth 2 (two) times daily. 180 tablet 3   spironolactone (ALDACTONE) 25 MG tablet Take 1 tablet (25 mg total) by mouth daily. 90 tablet 3   ticagrelor (BRILINTA) 90 MG TABS tablet Take 1 tablet (90 mg total) by mouth  2 (two) times daily. 90 tablet 2   Evolocumab (REPATHA SURECLICK) 456 MG/ML SOAJ Inject 140 mg into the skin every 14 (fourteen) days. (Patient not taking: Reported on 01/05/2022) 2 mL 12   TRULANCE 3 MG TABS Take 1 tablet by mouth daily. (Patient not taking: Reported on 01/05/2022)     No current facility-administered medications for this encounter.   No Known Allergies  Family History  Problem Relation Age of Onset   Cancer Mother    Cancer Father    BP 110/70   Pulse 79   Wt 84.7 kg (186 lb 12.8 oz)   SpO2 99%   BMI 28.40 kg/m   Wt Readings from Last 3 Encounters:  01/05/22 84.7 kg (186 lb 12.8 oz)  11/05/21 85.3 kg (188 lb)  10/05/21 86.5 kg (190 lb 9.6 oz)   PHYSICAL EXAM: General:  NAD. No resp difficulty, walked into clinic with a cane HEENT: Normal Neck: Supple. No JVD. Carotids 2+ bilat; no bruits. No lymphadenopathy or thryomegaly appreciated. Cor: PMI nondisplaced. Regular rate & rhythm. No rubs, gallops or murmurs. Lungs: Clear Abdomen: Soft, nontender, nondistended. No hepatosplenomegaly. No bruits or masses. Good bowel sounds. Extremities: No cyanosis, clubbing, rash, edema Neuro: Alert & oriented x 3, cranial nerves grossly intact. Moves all 4 extremities w/o difficulty. Affect pleasant.  ASSESSMENT & PLAN: 1. Chronic systolic CHF:  Ischemic cardiomyopathy.  Echo in 9/22 with EF 30-35%.  Repeat echo in 12/22 showed EF still in the 30-35% range with normal RV.  Repeat echo in 3/23 with EF still 30-35%.  He decided against ICD placement after extensive discussion and Lifevest is now off.  He is not volume overloaded on exam.  NYHA class I-II.  - Continue Entresto 97/103 bid.  - Continue spironolactone 25 mg daily.  BMET today.  - Continue Coreg 18.75 mg bid.  - Continue Jardiance. 2. CAD: Anterior STEMI in 9/22 with severe mLAD stenosis treated with PCI/DES x1, and severe OM stenosis treated with PCI/DES x1, non dominant RCA with mild non-obstructive disease.  No  chest pain.   - Will need DAPT with ASA/Brilinta for one year. - Continue Crestor 40 mg daily. LDL goal < 55, LDL 92 (5/23).  - Referred to Lipid CLinic for Repatha, he has not picked up yet, says he was told it would be expensive co-pay. Will reach out to pharmacist today. 3. HTN: BP controlled.  4. Hyperlipidemia: Continue statin and advised he start [place].  5. DMII: Per PCP 6. Diabetic retinopathy: Patient needs an eye surgery, he is not sure about the details.  Per Dr. Claris Gladden previous note, if urgent, he has been on ASA/ticagrelor for > 6 months and could hold for procedure.  If not urgent, would be ideal to continue DAPT for 1 year prior to holding for procedure.   Follow up 3-4 months with Dr. Aundra Dubin.  Maricela Bo [facility] FNP-BC 01/05/2022

## 2022-01-05 NOTE — Patient Instructions (Addendum)
It was great to see you today! No medication changes are needed at this time.   Labs today We will only contact you if something comes back abnormal or we need to make some changes. Otherwise no news is good news!  Your physician recommends that you schedule a follow-up appointment in: 3-4 months with Dr Aundra Dubin. Please call in October to be added to the November schedule   Do the following things EVERYDAY: Weigh yourself in the morning before breakfast. Write it down and keep it in a log. Take your medicines as prescribed Eat low salt foods--Limit salt (sodium) to 2000 mg per day.  Stay as active as you can everyday Limit all fluids for the day to less than 2 liters

## 2022-01-06 ENCOUNTER — Telehealth: Payer: Self-pay | Admitting: Pharmacist Clinician (PhC)/ Clinical Pharmacy Specialist

## 2022-01-06 NOTE — Telephone Encounter (Signed)
Patient on multiple brand medications (Brilinta, [place], [place]), has reached coverage gap and cannot afford another brand - We recently prescribed Repatha.    Will have him continue with rosuvastatin 40 and add ezetimibe 10 mg - appears to be $30/74month or if coverage gap $34/3 months  LMOM for patient to return call

## 2022-01-11 MED ORDER — EZETIMIBE 10 MG PO TABS: 10.0000 mg | ORAL_TABLET | Freq: Every day | ORAL | 3 refills | Status: DC

## 2022-01-11 NOTE — Telephone Encounter (Signed)
Patient agreeable to starting ezetimibe 10 mg daily.  Repeat labs in 2 months.

## 2022-01-27 ENCOUNTER — Other Ambulatory Visit (HOSPITAL_COMMUNITY): Payer: Self-pay

## 2022-01-27 MED ORDER — TICAGRELOR 90 MG PO TABS: 90.0000 mg | ORAL_TABLET | Freq: Two times a day (BID) | ORAL | 1 refills | Status: DC

## 2022-02-09 DIAGNOSIS — I2510 Atherosclerotic heart disease of native coronary artery without angina pectoris: Secondary | ICD-10-CM | POA: Diagnosis not present

## 2022-02-09 DIAGNOSIS — K5939 Other megacolon: Secondary | ICD-10-CM | POA: Diagnosis not present

## 2022-02-09 DIAGNOSIS — K5900 Constipation, unspecified: Secondary | ICD-10-CM | POA: Diagnosis not present

## 2022-02-09 DIAGNOSIS — I255 Ischemic cardiomyopathy: Secondary | ICD-10-CM | POA: Diagnosis not present

## 2022-02-18 DIAGNOSIS — K828 Other specified diseases of gallbladder: Secondary | ICD-10-CM | POA: Diagnosis not present

## 2022-02-18 DIAGNOSIS — K5900 Constipation, unspecified: Secondary | ICD-10-CM | POA: Diagnosis not present

## 2022-02-18 DIAGNOSIS — N289 Disorder of kidney and ureter, unspecified: Secondary | ICD-10-CM | POA: Diagnosis not present

## 2022-02-18 DIAGNOSIS — N281 Cyst of kidney, acquired: Secondary | ICD-10-CM | POA: Diagnosis not present

## 2022-02-18 DIAGNOSIS — K760 Fatty (change of) liver, not elsewhere classified: Secondary | ICD-10-CM | POA: Diagnosis not present

## 2022-02-25 DIAGNOSIS — E785 Hyperlipidemia, unspecified: Secondary | ICD-10-CM | POA: Diagnosis not present

## 2022-02-25 DIAGNOSIS — E1169 Type 2 diabetes mellitus with other specified complication: Secondary | ICD-10-CM | POA: Diagnosis not present

## 2022-02-25 DIAGNOSIS — Z79899 Other long term (current) drug therapy: Secondary | ICD-10-CM | POA: Diagnosis not present

## 2022-02-25 DIAGNOSIS — I252 Old myocardial infarction: Secondary | ICD-10-CM | POA: Diagnosis not present

## 2022-02-25 DIAGNOSIS — I255 Ischemic cardiomyopathy: Secondary | ICD-10-CM | POA: Diagnosis not present

## 2022-02-25 DIAGNOSIS — D539 Nutritional anemia, unspecified: Secondary | ICD-10-CM | POA: Diagnosis not present

## 2022-02-25 DIAGNOSIS — K5909 Other constipation: Secondary | ICD-10-CM | POA: Diagnosis not present

## 2022-02-25 DIAGNOSIS — I10 Essential (primary) hypertension: Secondary | ICD-10-CM | POA: Diagnosis not present

## 2022-03-05 DIAGNOSIS — E1169 Type 2 diabetes mellitus with other specified complication: Secondary | ICD-10-CM | POA: Diagnosis not present

## 2022-03-19 DIAGNOSIS — E875 Hyperkalemia: Secondary | ICD-10-CM | POA: Diagnosis not present

## 2022-03-19 DIAGNOSIS — E785 Hyperlipidemia, unspecified: Secondary | ICD-10-CM | POA: Diagnosis not present

## 2022-03-19 DIAGNOSIS — E1169 Type 2 diabetes mellitus with other specified complication: Secondary | ICD-10-CM | POA: Diagnosis not present

## 2022-04-06 DIAGNOSIS — E875 Hyperkalemia: Secondary | ICD-10-CM | POA: Diagnosis not present

## 2022-04-06 DIAGNOSIS — I10 Essential (primary) hypertension: Secondary | ICD-10-CM | POA: Diagnosis not present

## 2022-04-06 DIAGNOSIS — E1169 Type 2 diabetes mellitus with other specified complication: Secondary | ICD-10-CM | POA: Diagnosis not present

## 2022-04-12 DIAGNOSIS — K5909 Other constipation: Secondary | ICD-10-CM | POA: Diagnosis not present

## 2022-04-12 DIAGNOSIS — M6289 Other specified disorders of muscle: Secondary | ICD-10-CM | POA: Diagnosis not present

## 2022-04-13 ENCOUNTER — Other Ambulatory Visit (HOSPITAL_COMMUNITY): Payer: Self-pay | Admitting: Family Medicine

## 2022-04-20 ENCOUNTER — Other Ambulatory Visit (HOSPITAL_COMMUNITY): Payer: Self-pay

## 2022-04-20 MED ORDER — SACUBITRIL-VALSARTAN 97-103 MG PO TABS: 1.0000 | ORAL_TABLET | Freq: Two times a day (BID) | ORAL | 3 refills | Status: DC

## 2022-04-21 ENCOUNTER — Other Ambulatory Visit (HOSPITAL_COMMUNITY): Payer: Self-pay

## 2022-04-21 ENCOUNTER — Telehealth (HOSPITAL_COMMUNITY): Payer: Self-pay

## 2022-04-21 ENCOUNTER — Other Ambulatory Visit (HOSPITAL_COMMUNITY): Payer: Self-pay | Admitting: *Deleted

## 2022-04-21 MED ORDER — SACUBITRIL-VALSARTAN 97-103 MG PO TABS: 1.0000 | ORAL_TABLET | Freq: Two times a day (BID) | ORAL | 3 refills | Status: DC

## 2022-04-21 MED ORDER — EMPAGLIFLOZIN 25 MG PO TABS: 25.0000 mg | ORAL_TABLET | Freq: Every day | ORAL | 11 refills | Status: DC

## 2022-04-21 NOTE — Telephone Encounter (Signed)
Advanced Heart Failure Patient Advocate Encounter   Received renewal notification for [employer]). This patient is also currently taking Jardiance [employer]) and is eligible for a grant that is currently open and would cover the cost of both medications.   Left voicemail for patient to call back to start application process.   Clista Bernhardt, CPhT Rx Patient Advocate Phone: [phone]

## 2022-04-21 NOTE — Telephone Encounter (Signed)
Advanced Heart Failure Patient Advocate Encounter  The patient was approved for a [employer] grant that will help cover the cost of Entresto, Jardiance.  Total amount awarded, $10,000.  Effective: 03/22/2022 - 03/22/2023.  BIN [id number] PCN [id number] Group [id number] ID [id number]  New prescription(s) sent to [facility]. Patient provided with approval and processing information via email.  Clista Bernhardt, CPhT Rx Patient Advocate Phone: [phone]

## 2022-04-28 DIAGNOSIS — H35371 Puckering of macula, right eye: Secondary | ICD-10-CM | POA: Diagnosis not present

## 2022-04-28 DIAGNOSIS — E113512 Type 2 diabetes mellitus with proliferative diabetic retinopathy with macular edema, left eye: Secondary | ICD-10-CM | POA: Diagnosis not present

## 2022-04-28 DIAGNOSIS — E113511 Type 2 diabetes mellitus with proliferative diabetic retinopathy with macular edema, right eye: Secondary | ICD-10-CM | POA: Diagnosis not present

## 2022-04-28 DIAGNOSIS — H4311 Vitreous hemorrhage, right eye: Secondary | ICD-10-CM | POA: Diagnosis not present

## 2022-05-04 ENCOUNTER — Other Ambulatory Visit: Payer: Self-pay | Admitting: *Deleted

## 2022-05-04 MED ORDER — EZETIMIBE 10 MG PO TABS: 10.0000 mg | ORAL_TABLET | Freq: Every day | ORAL | 3 refills | Status: DC

## 2022-05-10 ENCOUNTER — Ambulatory Visit: Payer: PPO | Attending: Physician Assistant | Admitting: Physician Assistant

## 2022-05-10 ENCOUNTER — Telehealth: Payer: Self-pay | Admitting: *Deleted

## 2022-05-10 DIAGNOSIS — Z01810 Encounter for preprocedural cardiovascular examination: Secondary | ICD-10-CM

## 2022-05-10 NOTE — Telephone Encounter (Addendum)
   Name: Lawrence [name]  DOB: 07/02/1965  MRN: [id number]  Primary Cardiologist: Lauree Chandler, MD / AHF - Dr. Aundra Dubin  Chart reviewed as part of pre-operative protocol coverage. Because of Lawrence [name]'s past medical history and time since last visit, he will require a follow-up PHONE VISIT (correction) in order to better assess preoperative cardiovascular risk. I tried to call patient but was unable to reach him. DAPT was touched upon in 01/2022 OV but eye surgeon indicates no need to hold. However, he has since self-discontinued spironolactone. Have reached out to HF team via secure chat to inquire their thoughts on clearing or if he needs OV. Dr. Aundra Dubin recommends OK to clear if no new symptoms. Therefore will need to be added on for ASAP tele visit. Preoperative team, please contact this patient and set up a phone call appointment for further preoperative risk assessment. Please obtain consent and complete medication review. Thank you for your help.  I will route this note to requesting surgeon via fax so they are aware we are trying to contact patient for a virtual visit though first try was unsuccessful as above.  [name], PA-C 05/10/2022, 2:09 PM [place]

## 2022-05-10 NOTE — Telephone Encounter (Signed)
I s/w the pt and he is agreeable to tele add on appt today per Melina Copa, PAC. Consent has been done, though med rec will need to be done by [facility].

## 2022-05-10 NOTE — Telephone Encounter (Signed)
I s/w the pt and he is agreeable to tele add on appt today per Melina Copa, PAC. Consent has been done, though med rec will need to be done by [facility].       Patient Consent for Virtual Visit        Lawrence [name] has provided verbal consent on 05/10/2022 for a virtual visit (video or telephone).   CONSENT FOR VIRTUAL VISIT FOR:  Lawrence [name]  By participating in this virtual visit I agree to the following:  I hereby voluntarily request, consent and authorize [place] and its employed or contracted physicians, physician assistants, nurse practitioners or other licensed health care professionals (the Practitioner), to provide me with telemedicine health care services (the "Services") as deemed necessary by the treating Practitioner. I acknowledge and consent to receive the Services by the Practitioner via telemedicine. I understand that the telemedicine visit will involve communicating with the Practitioner through live audiovisual communication technology and the disclosure of certain medical information by electronic transmission. I acknowledge that I have been given the opportunity to request an in-person assessment or other available alternative prior to the telemedicine visit and am voluntarily participating in the telemedicine visit.  I understand that I have the right to withhold or withdraw my consent to the use of telemedicine in the course of my care at any time, without affecting my right to future care or treatment, and that the Practitioner or I may terminate the telemedicine visit at any time. I understand that I have the right to inspect all information obtained and/or recorded in the course of the telemedicine visit and may receive copies of available information for a reasonable fee.  I understand that some of the potential risks of receiving the Services via telemedicine include:  Delay or interruption in medical evaluation due to technological equipment failure or  disruption; Information transmitted may not be sufficient (e.g. poor resolution of images) to allow for appropriate medical decision making by the Practitioner; and/or  In rare instances, security protocols could fail, causing a breach of personal health information.  Furthermore, I acknowledge that it is my responsibility to provide information about my medical history, conditions and care that is complete and accurate to the best of my ability. I acknowledge that Practitioner's advice, recommendations, and/or decision may be based on factors not within their control, such as incomplete or inaccurate data provided by me or distortions of diagnostic images or specimens that may result from electronic transmissions. I understand that the practice of medicine is not an exact science and that Practitioner makes no warranties or guarantees regarding treatment outcomes. I acknowledge that a copy of this consent can be made available to me via my patient portal ([place]), or I can request a printed copy by calling the office of [place].    I understand that my insurance will be billed for this visit.   I have read or had this consent read to me. I understand the contents of this consent, which adequately explains the benefits and risks of the Services being provided via telemedicine.  I have been provided ample opportunity to ask questions regarding this consent and the Services and have had my questions answered to my satisfaction. I give my informed consent for the services to be provided through the use of telemedicine in my medical care

## 2022-05-10 NOTE — Progress Notes (Addendum)
Virtual Visit via Telephone Note   Because of Lawrence [name]'s co-morbid illnesses, he is at least at moderate risk for complications without adequate follow up.  This format is felt to be most appropriate for this patient at this time.  The patient did not have access to video technology/had technical difficulties with video requiring transitioning to audio format only (telephone).  All issues noted in this document were discussed and addressed.  No physical exam could be performed with this format.  Please refer to the patient's chart for his consent to telehealth for [facility].  Evaluation Performed:  Preoperative cardiovascular risk assessment _____________   Date:  05/10/2022   Patient ID:  Lawrence [name], DOB Nov 04, 1965, MRN [id number] Patient Location:  Home Provider location:   Office  Primary Care Provider:  Melony Overly, MD Primary Cardiologist:  Lauree Chandler, MD  Chief Complaint / Patient Profile   56 y.o. y/o male with a h/o chronic knee pain from crush injury, DM, HTN, hyperlipidemia, CAD with STEMI 02/2021 s/p PTCA/DES x 1 mid LAD and PTCA/DES x 1 obtuse marginal, ischemic cardiomyopathy/chronic HFrEF who is pending retinal surgery with vitrectomy removal and internal limiting membrane and presents today for telephonic preoperative cardiovascular risk assessment.  History of Present Illness    Lawrence [name] is a 56 y.o. male who presents via audio/video conferencing for a telehealth visit today.  Pt was last seen in cardiology clinic on 01/05/22 by Allena Katz NP. At that time Lawrence [name] was doing well.  The patient is now pending procedure as outlined above. I was unable to reach him on his main phone so called the second number and daughter was able to establish a 3 way line. Since his last visit, he reports e has been doing well without any recent CP, SOB or syncope. He reports his PCP in [place] stopped spironolactone due to elevated potasium  reading that he reports was normal on recheck after he had stopped it. He is disappointed that our office did not receive communication about this.   Past Medical History    Past Medical History:  Diagnosis Date   Diabetes mellitus without complication ([place])    HTN (hypertension)    Hyperlipidemia    Past Surgical History:  Procedure Laterality Date   CORONARY STENT INTERVENTION N/A 02/12/2021   Procedure: CORONARY STENT INTERVENTION;  Surgeon: Burnell Blanks, MD;  Location: [facility];  Service: Cardiovascular;  Laterality: N/A;   CORONARY/GRAFT ACUTE MI REVASCULARIZATION N/A 02/12/2021   Procedure: Coronary/Graft Acute MI Revascularization;  Surgeon: Burnell Blanks, MD;  Location: [facility] CV [facility];  Service: Cardiovascular;  Laterality: N/A;   EXTERNAL FIXATION LEG Right 02/27/2016   Procedure: EXTERNAL FIXATION LEG;  Surgeon: Netta Cedars, MD;  Location: [facility];  Service: Orthopedics;  Laterality: Right;  reduction and external fixation right tibial plateau fracture   EXTERNAL FIXATION REMOVAL Right 03/04/2016   Procedure: REMOVAL EXTERNAL FIXATION LEG;  Surgeon: Altamese Newberry, MD;  Location: [facility];  Service: Orthopedics;  Laterality: Right;   HARVEST BONE GRAFT Left 03/04/2016   Procedure: Rimmed IM aspirate;  Surgeon: Altamese Pushmataha, MD;  Location: [facility];  Service: Orthopedics;  Laterality: Left;   LEFT HEART CATH AND CORONARY ANGIOGRAPHY N/A 02/12/2021   Procedure: LEFT HEART CATH AND CORONARY ANGIOGRAPHY;  Surgeon: Burnell Blanks, MD;  Location: [facility] CV [facility];  Service: Cardiovascular;  Laterality: N/A;   ORIF TIBIA PLATEAU Right 03/04/2016   Procedure:  OPEN REDUCTION INTERNAL FIXATION (ORIF) TIBIAL PLATEAU;  Surgeon: Altamese [place], MD;  Location: [facility];  Service: Orthopedics;  Laterality: Right;    Allergies  No Known Allergies  Home Medications    Prior to Admission medications   Medication Sig Start Date End Date Taking? Authorizing Provider   albuterol (VENTOLIN HFA) 108 (90 Base) MCG/ACT inhaler as needed. 02/12/21   [provider]  aspirin 81 MG chewable tablet Chew 1 tablet (81 mg total) by mouth daily. 02/17/21   Cheryln Manly, NP  blood glucose meter kit and supplies KIT Dispense based on patient and insurance preference. Use up to four times daily as directed. (FOR ICD-9 250.00, 250.01). 03/08/16   Ainsley Spinner, PA-C  carvedilol (COREG) 12.5 MG tablet Take 1.5 tablets (18.75 mg total) by mouth 2 (two) times daily with a meal. 10/05/21   Larey Dresser, MD  empagliflozin (JARDIANCE) 25 MG TABS tablet Take 1 tablet (25 mg total) by mouth daily. 04/21/22   Larey Dresser, MD  Evolocumab (REPATHA SURECLICK) 676 MG/ML SOAJ Inject 140 mg into the skin every 14 (fourteen) days. Patient not taking: Reported on 01/05/2022 11/17/21   Larey Dresser, MD  ezetimibe (ZETIA) 10 MG tablet Take 1 tablet (10 mg total) by mouth daily. 05/04/22   Larey Dresser, MD  FREESTYLE PRECISION NEO TEST test strip USE 1 STRIP TO CHECK GLUCOSE ONCE DAILY 07/26/20   [provider]  metFORMIN (GLUCOPHAGE-XR) 500 MG 24 hr tablet Take 1,000 mg by mouth 2 (two) times daily. 10/22/20   [provider]  nitroGLYCERIN (NITROSTAT) 0.4 MG SL tablet Place 1 tablet (0.4 mg total) under the tongue every 5 (five) minutes as needed for chest pain. 02/16/21   Cheryln Manly, NP  rosuvastatin (CRESTOR) 40 MG tablet Take 1 tablet (40 mg total) by mouth daily. 02/17/21   Cheryln Manly, NP  sacubitril-valsartan (ENTRESTO) 97-103 MG Take 1 tablet by mouth 2 (two) times daily. 04/21/22   Larey Dresser, MD  ticagrelor (BRILINTA) 90 MG TABS tablet Take 1 tablet (90 mg total) by mouth 2 (two) times daily. 01/27/22   Larey Dresser, MD  TRULANCE 3 MG TABS Take 1 tablet by mouth daily. Patient not taking: Reported on 01/05/2022 08/13/21   [provider]    Physical Exam    Vital Signs:  Lawrence [name] does not have vital signs available  for review today.  Given telephonic nature of communication, physical exam is limited. AAOx3. NAD. Normal affect.  Speech and respirations are unlabored.  Accessory Clinical Findings    None  Assessment & Plan    1.  Preoperative Cardiovascular Risk Assessment: RCRI 6.6% indicating moderate CV risk. I reached out to Dr. Aundra Dubin regarding his history who suggested patient may proceed with surgery as requested if no new symptoms. The patient affirms he has been doing well without any new cardiac symptoms. Therefore, based on ACC/AHA guidelines, the patient would be at acceptable risk for the planned procedure without further cardiovascular testing. The patient was advised that if he develops new symptoms prior to surgery to contact our office to arrange for a follow-up visit, and he verbalized understanding.   Per surgical intake received by general cardiology team today, surgeon notified our office that that they do not need to hold aspirin or Brilinta for the surgery. I relayed this recommendation to the patient as well. He skipped his Brilinta last night and this morning but plans to get back  on track with dosing now. He knows not to take Brilinta less than 12 hours apart and only takes the aspirin once daily.  I did encourage him to schedule f/u with the CHF as previously instructed - was asked at 01/2022 OV to call in October for a November appointment. He also reports he had been calling the office and leaving messages on a machine for CHF office but I do not see any phone notes outlining this. I clarified the CHF number today with him and his daughter and asked him to discuss his concerns with the office communication with that team. He states he also plans to get a new PCP as he is upset they did not send [place] communication about his spironolactone.  A copy of this note will be routed to requesting surgeon.  Time:   Today, I have spent 10 minutes with the patient with telehealth technology  discussing medical history, symptoms, and management plan.     [name], PA-C  05/10/2022, 2:34 PM

## 2022-05-10 NOTE — Telephone Encounter (Signed)
DR. Stacie [name] called in regard to procedure scheduled for tomorrow 05/11/22. Clearance request was sent to HF clinic where the pt see's Dr. Aundra [name].   I have obtained the clearance information today by phone as procedure is tomorrow.      Pre-operative Risk Assessment    Patient Name: Lawrence [name]  DOB: Nov 25, 1965 MRN: [id number]    REQUESTING OFFICE STATES THEY ARE CONCERNED THAT THE PT HAD A RECENT LIFE VEST THAT HAS BEEN REMOVED, AND PT DECLINED TO HAVE A DEFIBRILLATOR PLACED. AS WELL AS PT STATES TO DR. PECEN OFFICE THAT HE HAS STOPPED HIS SPIRONOLACTONE ON HIS OWN ACCORD.   Request for Surgical Clearance    Procedure:   RETINA SURGERY; VITRECTOMY REMOVAL INTERNAL LIMITING MEMBRANE  Date of Surgery:  Clearance 05/11/22                                 Surgeon:  DR. Dennie [name] Surgeon's Group or Practice Name:  [facility] Phone number:  [phone] Fax number:  [phone]   Type of Clearance Requested:   - Medical ; PER REQUESTING OFFICE CALL TODAY; THEY DO NOT NEED TO HOLD ANY MEDICATIONS INCLUDING ASA AND BRILINTA   Type of Anesthesia:   IV SEDATION?    Additional requests/questions:    Lawrence [name]   05/10/2022, 1:02 PM

## 2022-05-11 ENCOUNTER — Telehealth (HOSPITAL_COMMUNITY): Payer: Self-pay

## 2022-05-11 DIAGNOSIS — H4311 Vitreous hemorrhage, right eye: Secondary | ICD-10-CM | POA: Diagnosis not present

## 2022-05-11 DIAGNOSIS — H35371 Puckering of macula, right eye: Secondary | ICD-10-CM | POA: Diagnosis not present

## 2022-05-11 NOTE — Telephone Encounter (Signed)
Received a medical clearance form from [facility] , requesting Cardiology clearance for the following procedure: Vitrectomy Mechanical with Rem of int limiting mem. Medical clearance form was signed by Theador Hawthorne and successfully faxed to [phone] on Friday, December 1,. Form will be scanned into patients chart.

## 2022-05-18 DIAGNOSIS — M6289 Other specified disorders of muscle: Secondary | ICD-10-CM | POA: Diagnosis not present

## 2022-05-19 DIAGNOSIS — Z09 Encounter for follow-up examination after completed treatment for conditions other than malignant neoplasm: Secondary | ICD-10-CM | POA: Diagnosis not present

## 2022-05-19 DIAGNOSIS — E113511 Type 2 diabetes mellitus with proliferative diabetic retinopathy with macular edema, right eye: Secondary | ICD-10-CM | POA: Diagnosis not present

## 2022-05-27 DIAGNOSIS — E1169 Type 2 diabetes mellitus with other specified complication: Secondary | ICD-10-CM | POA: Diagnosis not present

## 2022-05-27 DIAGNOSIS — E785 Hyperlipidemia, unspecified: Secondary | ICD-10-CM | POA: Diagnosis not present

## 2022-05-27 DIAGNOSIS — I252 Old myocardial infarction: Secondary | ICD-10-CM | POA: Diagnosis not present

## 2022-05-27 DIAGNOSIS — D539 Nutritional anemia, unspecified: Secondary | ICD-10-CM | POA: Diagnosis not present

## 2022-05-27 DIAGNOSIS — K5909 Other constipation: Secondary | ICD-10-CM | POA: Diagnosis not present

## 2022-05-27 DIAGNOSIS — I255 Ischemic cardiomyopathy: Secondary | ICD-10-CM | POA: Diagnosis not present

## 2022-05-27 DIAGNOSIS — I10 Essential (primary) hypertension: Secondary | ICD-10-CM | POA: Diagnosis not present

## 2022-06-02 ENCOUNTER — Other Ambulatory Visit (HOSPITAL_COMMUNITY): Payer: Self-pay

## 2022-06-02 MED ORDER — TICAGRELOR 90 MG PO TABS: 90.0000 mg | ORAL_TABLET | Freq: Two times a day (BID) | ORAL | 1 refills | Status: DC

## 2022-06-10 ENCOUNTER — Other Ambulatory Visit (HOSPITAL_COMMUNITY): Payer: Self-pay

## 2022-06-10 MED ORDER — TICAGRELOR 90 MG PO TABS: 90.0000 mg | ORAL_TABLET | Freq: Two times a day (BID) | ORAL | 1 refills | Status: DC

## 2022-06-25 DIAGNOSIS — H35371 Puckering of macula, right eye: Secondary | ICD-10-CM | POA: Diagnosis not present

## 2022-07-05 ENCOUNTER — Other Ambulatory Visit (HOSPITAL_COMMUNITY): Payer: Self-pay

## 2022-07-07 DIAGNOSIS — E785 Hyperlipidemia, unspecified: Secondary | ICD-10-CM | POA: Diagnosis not present

## 2022-07-07 DIAGNOSIS — E1169 Type 2 diabetes mellitus with other specified complication: Secondary | ICD-10-CM | POA: Diagnosis not present

## 2022-07-07 DIAGNOSIS — I10 Essential (primary) hypertension: Secondary | ICD-10-CM | POA: Diagnosis not present

## 2022-07-12 ENCOUNTER — Ambulatory Visit (HOSPITAL_COMMUNITY)
Admission: RE | Admit: 2022-07-12 | Discharge: 2022-07-12 | Disposition: A | Discharge status: Home / Self Care | Payer: PPO | Source: Ambulatory Visit | Attending: Cardiology | Admitting: Cardiology

## 2022-07-12 ENCOUNTER — Encounter (HOSPITAL_COMMUNITY): Payer: Self-pay | Admitting: Cardiology

## 2022-07-12 ENCOUNTER — Other Ambulatory Visit (HOSPITAL_COMMUNITY): Payer: Self-pay | Admitting: Cardiology

## 2022-07-12 VITALS — BP 130/70 | HR 89 | Wt 196.0 lb

## 2022-07-12 DIAGNOSIS — Z7984 Long term (current) use of oral hypoglycemic drugs: Secondary | ICD-10-CM | POA: Insufficient documentation

## 2022-07-12 DIAGNOSIS — E119 Type 2 diabetes mellitus without complications: Secondary | ICD-10-CM | POA: Insufficient documentation

## 2022-07-12 DIAGNOSIS — Z955 Presence of coronary angioplasty implant and graft: Secondary | ICD-10-CM | POA: Insufficient documentation

## 2022-07-12 DIAGNOSIS — G8929 Other chronic pain: Secondary | ICD-10-CM | POA: Diagnosis not present

## 2022-07-12 DIAGNOSIS — I5022 Chronic systolic (congestive) heart failure: Secondary | ICD-10-CM

## 2022-07-12 DIAGNOSIS — I2510 Atherosclerotic heart disease of native coronary artery without angina pectoris: Secondary | ICD-10-CM | POA: Diagnosis not present

## 2022-07-12 DIAGNOSIS — X58XXXS Exposure to other specified factors, sequela: Secondary | ICD-10-CM | POA: Insufficient documentation

## 2022-07-12 DIAGNOSIS — Z79899 Other long term (current) drug therapy: Secondary | ICD-10-CM | POA: Diagnosis not present

## 2022-07-12 DIAGNOSIS — I110 Hypertensive heart disease with heart failure: Secondary | ICD-10-CM | POA: Insufficient documentation

## 2022-07-12 DIAGNOSIS — M25569 Pain in unspecified knee: Secondary | ICD-10-CM | POA: Insufficient documentation

## 2022-07-12 DIAGNOSIS — I255 Ischemic cardiomyopathy: Secondary | ICD-10-CM | POA: Diagnosis not present

## 2022-07-12 DIAGNOSIS — E785 Hyperlipidemia, unspecified: Secondary | ICD-10-CM | POA: Insufficient documentation

## 2022-07-12 DIAGNOSIS — I493 Ventricular premature depolarization: Secondary | ICD-10-CM | POA: Insufficient documentation

## 2022-07-12 DIAGNOSIS — I252 Old myocardial infarction: Secondary | ICD-10-CM | POA: Insufficient documentation

## 2022-07-12 DIAGNOSIS — Z7902 Long term (current) use of antithrombotics/antiplatelets: Secondary | ICD-10-CM | POA: Insufficient documentation

## 2022-07-12 LAB — BASIC METABOLIC PANEL
Anion gap: 12 (ref 5–15)
BUN: 21 mg/dL — ABNORMAL HIGH (ref 6–20)
CO2: 23 mmol/L (ref 22–32)
Calcium: 9.1 mg/dL (ref 8.9–10.3)
Chloride: 101 mmol/L (ref 98–111)
Creatinine, Ser: 0.98 mg/dL (ref 0.61–1.24)
GFR, Estimated: >60 mL/min (ref >60)
Glucose, Bld: 169 mg/dL — ABNORMAL HIGH (ref 70–99)
Potassium: 5 mmol/L (ref 3.5–5.1)
Sodium: 136 mmol/L (ref 135–145)

## 2022-07-12 LAB — LIPID PANEL
Cholesterol: 104 mg/dL (ref 0–200)
HDL: 30 mg/dL — ABNORMAL LOW (ref >40)
LDL Cholesterol: 48 mg/dL (ref 0–99)
Total CHOL/HDL Ratio: 3.5 RATIO
Triglycerides: 129 mg/dL (ref <150)
VLDL: 26 mg/dL (ref 0–40)

## 2022-07-12 LAB — BRAIN NATRIURETIC PEPTIDE: B Natriuretic Peptide: 56.1 pg/mL (ref 0.0–100.0)

## 2022-07-12 MED ORDER — SPIRONOLACTONE 25 MG PO TABS: 12.5000 mg | ORAL_TABLET | Freq: Every day | ORAL | 3 refills | Status: DC

## 2022-07-12 MED ORDER — CLOPIDOGREL BISULFATE 75 MG PO TABS: 75.0000 mg | ORAL_TABLET | Freq: Every day | ORAL | 3 refills | Status: DC

## 2022-07-12 NOTE — Progress Notes (Signed)
ADVANCED HF CLINIC NOTE  Primary Care: Melony Overly, MD HF Cardiologist: Dr. Aundra Dubin   HPI: Lawrence [name] is a 57 y.o. Hispanic male with a history of chronic knee pain from crush injury, DM, HTN, hyperlipidemia, CAD, ischemic cardiomyopathy.    Admitted 02/12/2021 with chest pain in the setting of STEMI. Had [place] and underwent successful PTCA/DES x 1 mid LAD and successful PTCA/DES x 1 obtuse marginal.  Echo in 9/22 showed EF 30-35%. CMRI with EF 31% and unable to assess viability due to acute MI, no thrombus. Discharged with LifeVest. Discharged on losartan, coreg, jardiance, and spironolactone.     Echo in 12/22 showed EF 30-35%, septal/apical severe hypokinesis, normal RV.  Patient wanted to wait a few more months and repeat echo before committing to ICD.  Repeat echo in 3/23 with EF still 350-35%, septal/apical severe HK, RV normal.  Patient decided he did not want ICD and took off [place].   Patient returns for followup of CHF.  He is doing well generally, no significant exertional dyspnea or chest pain.  He walks with a cane due to knee pain.  No orthopnea/PND.  No palpitations or lightheadedness. His PCP stopped spironolactone apparently due to elevated K. Weight is up about 10 lbs, he reports eating more.   ECG (personally reviewed): NSR, nonspecific T wave flattening  Labs (10/22): K 4.6, creatinine 0.99 Labs (11/22): LDL 76, TGs 186 Labs (12/22): BNP 85, K 4.6, creatinine 1.22 Labs (3/23): K 4.4, creatinine 0.93, hgb 12.3 Labs (5/23): K 5.1, creatinine 1.02, LDL 92, HDL 38, TGs 150 Labs  (8/23): K 4.7, creatinine 1.34   SH: Lives with his wife and child. Disabled from previous knee injury. He does not drink alcohol. He does not smoke.   PMH: 1. Type 2 diabetes 2. HTN 3. Hyperlipidemia 4. CAD: Anterior MI in 9/22 with DES to mLAD and DES to OM2.  5. Chronic systolic CHF: Ischemic cardiomyopathy.   - Echo (9/22): EF 30-35%, normal RV.  - Cardiac MRI (9/22): No thrombus. LGE  consistent with myocardial infarction in portion of the mid anterior/anteroseptal Unable to assess for viability in setting of acute MI (LGE overestimates final infarct size in setting of acute MI). EF 31%. Akinesis of mid anterior/anteroseptal walls, apical anterior/septal/inferior walls, and apex. RV normal.  - Echo (12/22): EF 30-35%, septal/apical severe hypokinesis, normal RV. 6. Diabetic retinopathy   Review of Systems: All systems reviewed and negative except as per HPI.   Current Outpatient Medications  Medication Sig Dispense Refill   albuterol (VENTOLIN HFA) 108 (90 Base) MCG/ACT inhaler as needed.     blood glucose meter kit and supplies KIT Dispense based on patient and insurance preference. Use up to four times daily as directed. (FOR ICD-9 250.00, 250.01). 1 each 0   carvedilol (COREG) 12.5 MG tablet Take 1.5 tablets (18.75 mg total) by mouth 2 (two) times daily with a meal. 60 tablet 11   clopidogrel (PLAVIX) 75 MG tablet Take 1 tablet (75 mg total) by mouth daily. 90 tablet 3   empagliflozin (JARDIANCE) 25 MG TABS tablet Take 1 tablet (25 mg total) by mouth daily. 30 tablet 11   ezetimibe (ZETIA) 10 MG tablet Take 1 tablet (10 mg total) by mouth daily. 90 tablet 3   FREESTYLE PRECISION NEO TEST test strip USE 1 STRIP TO CHECK GLUCOSE ONCE DAILY     metFORMIN (GLUCOPHAGE-XR) 500 MG 24 hr tablet Take 1,000 mg by mouth 2 (two) times daily.  nitroGLYCERIN (NITROSTAT) 0.4 MG SL tablet Place 1 tablet (0.4 mg total) under the tongue every 5 (five) minutes as needed for chest pain. 25 tablet 2   rosuvastatin (CRESTOR) 40 MG tablet Take 1 tablet (40 mg total) by mouth daily. 90 tablet 1   sacubitril-valsartan (ENTRESTO) 97-103 MG Take 1 tablet by mouth 2 (two) times daily. 180 tablet 3   spironolactone (ALDACTONE) 25 MG tablet Take 0.5 tablets (12.5 mg total) by mouth daily. 45 tablet 3   No current facility-administered medications for this encounter.   Allergies  Allergen  Reactions   Spironolactone     Hyperkalemia    Family History  Problem Relation Age of Onset   Cancer Mother    Cancer Father    BP 130/70   Pulse 89   Wt 88.9 kg (196 lb)   SpO2 98%   BMI 29.80 kg/m   Wt Readings from Last 3 Encounters:  07/12/22 88.9 kg (196 lb)  01/05/22 84.7 kg (186 lb 12.8 oz)  11/05/21 85.3 kg (188 lb)   PHYSICAL EXAM: General: NAD Neck: No JVD, no thyromegaly or thyroid nodule.  Lungs: Clear to auscultation bilaterally with normal respiratory effort. CV: Nondisplaced PMI.  Heart regular S1/S2, no S3/S4, no murmur.  No peripheral edema.  No carotid bruit.  Normal pedal pulses.  Abdomen: Soft, nontender, no hepatosplenomegaly, no distention.  Skin: Intact without lesions or rashes.  Neurologic: Alert and oriented x 3.  Psych: Normal affect. Extremities: No clubbing or cyanosis.  HEENT: Normal.   ASSESSMENT & PLAN: 1. Chronic systolic CHF:  Ischemic cardiomyopathy.  Echo in 9/22 with EF 30-35%.  Repeat echo in 12/22 showed EF still in the 30-35% range with normal RV.  Repeat echo in 3/23 with EF still 30-35%.  He decided against ICD placement after extensive discussion and Lifevest is now off.  He is not volume overloaded on exam, NYHA class I-II.  - Continue Entresto 97/103 bid.  - Restart spironolactone at lower dose, 12.5 mg daily.  BMET/BNP today and BMET in 10 days.   - Continue Coreg 18.75 mg bid.  - Continue Jardiance. - I will arrange for repeat echo.  2. CAD: Anterior STEMI in 9/22 with severe mLAD stenosis treated with PCI/DES x1, and severe OM stenosis treated with PCI/DES x1, non dominant RCA with mild non-obstructive disease.  No chest pain.   - He can stop ASA and Brilinta and start on Plavix 75 mg daily for long-term.  - Continue Crestor 40 mg daily and Zetia 10 mg daily.  Check lipids today.  3. HTN: BP controlled.  4. Hyperlipidemia: Continue Crestor and Zetia, check lipids.  5. DMII: Per PCP  Followup in 3 months with APP.    Loralie Champagne 07/12/2022

## 2022-07-12 NOTE — Patient Instructions (Signed)
STOP Asprin.  STOP Brilinta.  START Plavix 75 mg daily.  START Spironolactone 12.5 mg ( 1/2 Tab)  daily.  Labs done today, your results will be available in MyChart, we will contact you for abnormal readings.  Repeat blood work in 10 days.  Your physician has requested that you have an echocardiogram. Echocardiography is a painless test that uses sound waves to create images of your heart. It provides your doctor with information about the size and shape of your heart and how well your heart's chambers and valves are working. This procedure takes approximately one hour. There are no restrictions for this procedure. Please do NOT wear cologne, perfume, aftershave, or lotions (deodorant is allowed). Please arrive 15 minutes prior to your appointment time.  Your physician recommends that you schedule a follow-up appointment in: 3 months  If you have any questions or concerns before your next appointment please send [place] a message through [place] or call our office at [phone].    TO LEAVE A MESSAGE FOR THE NURSE SELECT OPTION 2, PLEASE LEAVE A MESSAGE INCLUDING: YOUR NAME DATE OF BIRTH CALL BACK NUMBER REASON FOR CALL**this is important as we prioritize the call backs  YOU WILL RECEIVE A CALL BACK THE SAME DAY AS LONG AS YOU CALL BEFORE 4:00 PM  At the [facility], you and your health needs are our priority. As part of our continuing mission to provide you with exceptional heart care, we have created designated Provider Care Teams. These Care Teams include your primary Cardiologist (physician) and Advanced Practice Providers (APPs- Physician Assistants and Nurse Practitioners) who all work together to provide you with the care you need, when you need it.   You may see any of the following providers on your designated Care Team at your next follow up: Dr Glori Bickers Dr Loralie Champagne Dr. Roxana Hires, NP Lyda Jester, [place] [facility] [place], [place] Forestine Na, NP Audry Riles, PharmD   Please be sure to bring in all your medications bottles to every appointment.    Thank you for choosing [facility]

## 2022-07-26 ENCOUNTER — Ambulatory Visit (HOSPITAL_COMMUNITY)
Admission: RE | Admit: 2022-07-26 | Discharge: 2022-07-26 | Disposition: A | Discharge status: Home / Self Care | Payer: PPO | Source: Ambulatory Visit | Attending: Cardiology | Admitting: Cardiology

## 2022-07-26 DIAGNOSIS — I5022 Chronic systolic (congestive) heart failure: Secondary | ICD-10-CM | POA: Insufficient documentation

## 2022-07-26 LAB — BASIC METABOLIC PANEL
Anion gap: 8 (ref 5–15)
BUN: 29 mg/dL — ABNORMAL HIGH (ref 6–20)
CO2: 24 mmol/L (ref 22–32)
Calcium: 9 mg/dL (ref 8.9–10.3)
Chloride: 103 mmol/L (ref 98–111)
Creatinine, Ser: 1.11 mg/dL (ref 0.61–1.24)
GFR, Estimated: >60 mL/min (ref >60)
Glucose, Bld: 140 mg/dL — ABNORMAL HIGH (ref 70–99)
Potassium: 4.7 mmol/L (ref 3.5–5.1)
Sodium: 135 mmol/L (ref 135–145)

## 2022-08-06 DIAGNOSIS — E113512 Type 2 diabetes mellitus with proliferative diabetic retinopathy with macular edema, left eye: Secondary | ICD-10-CM | POA: Diagnosis not present

## 2022-08-16 ENCOUNTER — Ambulatory Visit (HOSPITAL_COMMUNITY)
Admission: RE | Admit: 2022-08-16 | Discharge: 2022-08-16 | Disposition: A | Discharge status: Home / Self Care | Payer: PPO | Source: Ambulatory Visit | Attending: Family Medicine | Admitting: Family Medicine

## 2022-08-16 DIAGNOSIS — E785 Hyperlipidemia, unspecified: Secondary | ICD-10-CM | POA: Diagnosis not present

## 2022-08-16 DIAGNOSIS — I110 Hypertensive heart disease with heart failure: Secondary | ICD-10-CM | POA: Diagnosis not present

## 2022-08-16 DIAGNOSIS — I5022 Chronic systolic (congestive) heart failure: Secondary | ICD-10-CM

## 2022-08-16 DIAGNOSIS — E119 Type 2 diabetes mellitus without complications: Secondary | ICD-10-CM | POA: Diagnosis not present

## 2022-08-16 LAB — ECHOCARDIOGRAM COMPLETE
Area-P 1/2: 3.85 cm2
Calc EF: 41.4 %
S' Lateral: 4.1 cm
Single Plane A2C EF: 45.4 %
Single Plane A4C EF: 38.7 %

## 2022-08-16 NOTE — Progress Notes (Signed)
Echocardiogram 2D Echocardiogram has been performed.  Fidel Levy 08/16/2022, 11:52 AM

## 2022-08-20 ENCOUNTER — Encounter (HOSPITAL_COMMUNITY): Payer: Self-pay

## 2022-09-20 DIAGNOSIS — J3089 Other allergic rhinitis: Secondary | ICD-10-CM | POA: Diagnosis not present

## 2022-09-20 DIAGNOSIS — J302 Other seasonal allergic rhinitis: Secondary | ICD-10-CM | POA: Diagnosis not present

## 2022-09-27 DIAGNOSIS — I252 Old myocardial infarction: Secondary | ICD-10-CM | POA: Diagnosis not present

## 2022-09-27 DIAGNOSIS — I10 Essential (primary) hypertension: Secondary | ICD-10-CM | POA: Diagnosis not present

## 2022-09-27 DIAGNOSIS — I255 Ischemic cardiomyopathy: Secondary | ICD-10-CM | POA: Diagnosis not present

## 2022-09-27 DIAGNOSIS — D539 Nutritional anemia, unspecified: Secondary | ICD-10-CM | POA: Diagnosis not present

## 2022-09-27 DIAGNOSIS — E1169 Type 2 diabetes mellitus with other specified complication: Secondary | ICD-10-CM | POA: Diagnosis not present

## 2022-09-27 DIAGNOSIS — K5909 Other constipation: Secondary | ICD-10-CM | POA: Diagnosis not present

## 2022-09-27 DIAGNOSIS — E785 Hyperlipidemia, unspecified: Secondary | ICD-10-CM | POA: Diagnosis not present

## 2022-09-27 DIAGNOSIS — Z1331 Encounter for screening for depression: Secondary | ICD-10-CM | POA: Diagnosis not present

## 2022-10-07 ENCOUNTER — Other Ambulatory Visit (HOSPITAL_COMMUNITY): Payer: Self-pay | Admitting: Cardiology

## 2022-10-08 NOTE — Progress Notes (Signed)
ADVANCED HF CLINIC NOTE  Primary Care: Doreene Eland, MD HF Cardiologist: Dr. Shirlee Latch   HPI: Mr Mauricio is a 57 y.o. Hispanic male with a history of chronic knee pain from crush injury, DM, HTN, hyperlipidemia, CAD, ischemic cardiomyopathy.    Admitted 02/12/2021 with chest pain in the setting of STEMI. Had [place] and underwent successful PTCA/DES x 1 mid LAD and successful PTCA/DES x 1 obtuse marginal.  Echo in 9/22 showed EF 30-35%. CMRI with EF 31% and unable to assess viability due to acute MI, no thrombus. Discharged with LifeVest. Discharged on losartan, coreg, jardiance, and spironolactone.     Echo in 12/22 showed EF 30-35%, septal/apical severe hypokinesis, normal RV.  Patient wanted to wait a few more months and repeat echo before committing to ICD.  Repeat echo in 3/23 with EF still 350-35%, septal/apical severe HK, RV normal.  Patient decided he did not want ICD and took off Lifevest.   Patient returns for followup of CHF.  He is doing well generally, no significant exertional dyspnea or chest pain.  He walks with a cane due to knee pain.  No orthopnea/PND.  No palpitations or lightheadedness. His PCP stopped spironolactone apparently due to elevated K. Weight is up about 10 lbs, he reports eating more.   ECG (personally reviewed): NSR, nonspecific T wave flattening  Labs (10/22): K 4.6, creatinine 0.99 Labs (11/22): LDL 76, TGs 186 Labs (12/22): BNP 85, K 4.6, creatinine 1.22 Labs (3/23): K 4.4, creatinine 0.93, hgb 12.3 Labs (5/23): K 5.1, creatinine 1.02, LDL 92, HDL 38, TGs 150 Labs  (8/23): K 4.7, creatinine 1.34   SH: Lives with his wife and child. Disabled from previous knee injury. He does not drink alcohol. He does not smoke.   PMH: 1. Type 2 diabetes 2. HTN 3. Hyperlipidemia 4. CAD: Anterior MI in 9/22 with DES to mLAD and DES to OM2.  5. Chronic systolic CHF: Ischemic cardiomyopathy.   - Echo (9/22): EF 30-35%, normal RV.  - Cardiac MRI (9/22): No thrombus. LGE  consistent with myocardial infarction in portion of the mid anterior/anteroseptal Unable to assess for viability in setting of acute MI (LGE overestimates final infarct size in setting of acute MI). EF 31%. Akinesis of mid anterior/anteroseptal walls, apical anterior/septal/inferior walls, and apex. RV normal.  - Echo (12/22): EF 30-35%, septal/apical severe hypokinesis, normal RV. 6. Diabetic retinopathy   Review of Systems: All systems reviewed and negative except as per HPI.   Current Outpatient Medications  Medication Sig Dispense Refill   albuterol (VENTOLIN HFA) 108 (90 Base) MCG/ACT inhaler as needed.     blood glucose meter kit and supplies KIT Dispense based on patient and insurance preference. Use up to four times daily as directed. (FOR ICD-9 250.00, 250.01). 1 each 0   carvedilol (COREG) 12.5 MG tablet TAKE 1 & 1/2 (ONE & ONE-HALF) TABLETS BY MOUTH TWICE DAILY WITH A MEAL 270 tablet 0   clopidogrel (PLAVIX) 75 MG tablet Take 1 tablet (75 mg total) by mouth daily. 90 tablet 3   empagliflozin (JARDIANCE) 25 MG TABS tablet Take 1 tablet (25 mg total) by mouth daily. 30 tablet 11   ezetimibe (ZETIA) 10 MG tablet Take 1 tablet (10 mg total) by mouth daily. 90 tablet 3   FREESTYLE PRECISION NEO TEST test strip USE 1 STRIP TO CHECK GLUCOSE ONCE DAILY     metFORMIN (GLUCOPHAGE-XR) 500 MG 24 hr tablet Take 1,000 mg by mouth 2 (two) times daily.  nitroGLYCERIN (NITROSTAT) 0.4 MG SL tablet Place 1 tablet (0.4 mg total) under the tongue every 5 (five) minutes as needed for chest pain. 25 tablet 2   rosuvastatin (CRESTOR) 40 MG tablet Take 1 tablet (40 mg total) by mouth daily. 90 tablet 1   sacubitril-valsartan (ENTRESTO) 97-103 MG Take 1 tablet by mouth 2 (two) times daily. 180 tablet 3   spironolactone (ALDACTONE) 25 MG tablet Take 0.5 tablets (12.5 mg total) by mouth daily. 45 tablet 3   No current facility-administered medications for this visit.   Allergies  Allergen Reactions    Spironolactone     Hyperkalemia    Family History  Problem Relation Age of Onset   Cancer Mother    Cancer Father    There were no vitals taken for this visit.  Wt Readings from Last 3 Encounters:  07/12/22 88.9 kg (196 lb)  01/05/22 84.7 kg (186 lb 12.8 oz)  11/05/21 85.3 kg (188 lb)   PHYSICAL EXAM: General: NAD Neck: No JVD, no thyromegaly or thyroid nodule.  Lungs: Clear to auscultation bilaterally with normal respiratory effort. CV: Nondisplaced PMI.  Heart regular S1/S2, no S3/S4, no murmur.  No peripheral edema.  No carotid bruit.  Normal pedal pulses.  Abdomen: Soft, nontender, no hepatosplenomegaly, no distention.  Skin: Intact without lesions or rashes.  Neurologic: Alert and oriented x 3.  Psych: Normal affect. Extremities: No clubbing or cyanosis.  HEENT: Normal.   ASSESSMENT & PLAN: 1. Chronic systolic CHF:  Ischemic cardiomyopathy.  Echo in 9/22 with EF 30-35%.  Repeat echo in 12/22 showed EF still in the 30-35% range with normal RV.  Repeat echo in 3/23 with EF still 30-35%.  He decided against ICD placement after extensive discussion and Lifevest is now off.  He is not volume overloaded on exam, NYHA class I-II.  - Continue Entresto 97/103 bid.  - Restart spironolactone at lower dose, 12.5 mg daily.  BMET/BNP today and BMET in 10 days.   - Continue Coreg 18.75 mg bid.  - Continue Jardiance. - I will arrange for repeat echo.  2. CAD: Anterior STEMI in 9/22 with severe mLAD stenosis treated with PCI/DES x1, and severe OM stenosis treated with PCI/DES x1, non dominant RCA with mild non-obstructive disease.  No chest pain.   - He can stop ASA and Brilinta and start on Plavix 75 mg daily for long-term.  - Continue Crestor 40 mg daily and Zetia 10 mg daily.  Check lipids today.  3. HTN: BP controlled.  4. Hyperlipidemia: Continue Crestor and Zetia, check lipids.  5. DMII: Per PCP  Followup in 3 months with APP.   Anderson Malta [facility] 10/08/2022

## 2022-10-11 ENCOUNTER — Ambulatory Visit (HOSPITAL_COMMUNITY)
Admission: RE | Admit: 2022-10-11 | Discharge: 2022-10-11 | Disposition: A | Discharge status: Home / Self Care | Payer: PPO | Source: Ambulatory Visit | Attending: Family Medicine | Admitting: Family Medicine

## 2022-10-11 ENCOUNTER — Encounter (HOSPITAL_COMMUNITY): Payer: Self-pay

## 2022-10-11 VITALS — BP 124/82 | HR 82 | Wt 203.0 lb

## 2022-10-11 DIAGNOSIS — Z79899 Other long term (current) drug therapy: Secondary | ICD-10-CM | POA: Diagnosis not present

## 2022-10-11 DIAGNOSIS — E11319 Type 2 diabetes mellitus with unspecified diabetic retinopathy without macular edema: Secondary | ICD-10-CM | POA: Diagnosis not present

## 2022-10-11 DIAGNOSIS — I110 Hypertensive heart disease with heart failure: Secondary | ICD-10-CM | POA: Diagnosis not present

## 2022-10-11 DIAGNOSIS — I2510 Atherosclerotic heart disease of native coronary artery without angina pectoris: Secondary | ICD-10-CM | POA: Insufficient documentation

## 2022-10-11 DIAGNOSIS — Z7902 Long term (current) use of antithrombotics/antiplatelets: Secondary | ICD-10-CM | POA: Diagnosis not present

## 2022-10-11 DIAGNOSIS — I10 Essential (primary) hypertension: Secondary | ICD-10-CM | POA: Diagnosis not present

## 2022-10-11 DIAGNOSIS — G8929 Other chronic pain: Secondary | ICD-10-CM | POA: Diagnosis not present

## 2022-10-11 DIAGNOSIS — I5022 Chronic systolic (congestive) heart failure: Secondary | ICD-10-CM | POA: Insufficient documentation

## 2022-10-11 DIAGNOSIS — Z955 Presence of coronary angioplasty implant and graft: Secondary | ICD-10-CM | POA: Insufficient documentation

## 2022-10-11 DIAGNOSIS — K5900 Constipation, unspecified: Secondary | ICD-10-CM | POA: Diagnosis not present

## 2022-10-11 DIAGNOSIS — I252 Old myocardial infarction: Secondary | ICD-10-CM | POA: Insufficient documentation

## 2022-10-11 DIAGNOSIS — I255 Ischemic cardiomyopathy: Secondary | ICD-10-CM | POA: Insufficient documentation

## 2022-10-11 DIAGNOSIS — E119 Type 2 diabetes mellitus without complications: Secondary | ICD-10-CM | POA: Diagnosis not present

## 2022-10-11 DIAGNOSIS — E785 Hyperlipidemia, unspecified: Secondary | ICD-10-CM | POA: Insufficient documentation

## 2022-10-11 DIAGNOSIS — Z7984 Long term (current) use of oral hypoglycemic drugs: Secondary | ICD-10-CM | POA: Diagnosis not present

## 2022-10-11 LAB — BASIC METABOLIC PANEL
Anion gap: 9 (ref 5–15)
BUN: 34 mg/dL — ABNORMAL HIGH (ref 6–20)
CO2: 26 mmol/L (ref 22–32)
Calcium: 9.2 mg/dL (ref 8.9–10.3)
Chloride: 100 mmol/L (ref 98–111)
Creatinine, Ser: 1.32 mg/dL — ABNORMAL HIGH (ref 0.61–1.24)
GFR, Estimated: >60 mL/min (ref >60)
Glucose, Bld: 192 mg/dL — ABNORMAL HIGH (ref 70–99)
Potassium: 5.1 mmol/L (ref 3.5–5.1)
Sodium: 135 mmol/L (ref 135–145)

## 2022-10-11 NOTE — Patient Instructions (Signed)
No change in medications. Labs today - will call you if abnormal. Return to see Dr. Shirlee Latch in 4 months. Please call us at [phone] in August to schedule this appointment. Please call us at [phone] if any questions or concerns prior to your next appointment.

## 2022-10-12 ENCOUNTER — Telehealth (HOSPITAL_COMMUNITY): Payer: Self-pay

## 2022-10-12 DIAGNOSIS — I5022 Chronic systolic (congestive) heart failure: Secondary | ICD-10-CM

## 2022-10-12 NOTE — Telephone Encounter (Signed)
Patient aware of labs and lab follow up scheduled.

## 2022-10-12 NOTE — Telephone Encounter (Signed)
-----   Message from Jacklynn Ganong, [place] sent at 10/11/2022  3:57 PM EDT ----- Kidney function mildly elevated.   Please repeat BMET in 2 weeks to follow

## 2022-10-21 ENCOUNTER — Telehealth: Payer: Self-pay

## 2022-10-21 NOTE — Patient Outreach (Signed)
  Care Coordination   10/21/2022 Name: KOALII INGS MRN: [id number] DOB: 1966/02/23   Care Coordination Outreach Attempts:  An unsuccessful telephone outreach was attempted today to offer the patient information about available care coordination services.  Follow Up Plan:  Additional outreach attempts will be made to offer the patient care coordination information and services.   Encounter Outcome:  No Answer   Care Coordination Interventions:  No, not indicated   Rowe Pavy, RN, BSN, [facility] [phone]

## 2022-10-25 ENCOUNTER — Ambulatory Visit (HOSPITAL_COMMUNITY)
Admission: RE | Admit: 2022-10-25 | Discharge: 2022-10-25 | Disposition: A | Discharge status: Home / Self Care | Payer: PPO | Source: Ambulatory Visit | Attending: Cardiology | Admitting: Cardiology

## 2022-10-25 DIAGNOSIS — I5022 Chronic systolic (congestive) heart failure: Secondary | ICD-10-CM | POA: Insufficient documentation

## 2022-10-25 LAB — BASIC METABOLIC PANEL
Anion gap: 8 (ref 5–15)
BUN: 35 mg/dL — ABNORMAL HIGH (ref 6–20)
CO2: 23 mmol/L (ref 22–32)
Calcium: 8.8 mg/dL — ABNORMAL LOW (ref 8.9–10.3)
Chloride: 103 mmol/L (ref 98–111)
Creatinine, Ser: 1.25 mg/dL — ABNORMAL HIGH (ref 0.61–1.24)
GFR, Estimated: >60 mL/min (ref >60)
Glucose, Bld: 186 mg/dL — ABNORMAL HIGH (ref 70–99)
Potassium: 4.6 mmol/L (ref 3.5–5.1)
Sodium: 134 mmol/L — ABNORMAL LOW (ref 135–145)

## 2022-10-29 ENCOUNTER — Telehealth: Payer: Self-pay

## 2022-10-29 NOTE — Patient Outreach (Signed)
  Care Coordination   10/29/2022 Name: BAIN LEWI MRN: [id number] DOB: 1965-11-23   Care Coordination Outreach Attempts:  A second unsuccessful outreach was attempted today to offer the patient with information about available care coordination services.  Follow Up Plan:  Additional outreach attempts will be made to offer the patient care coordination information and services.   Encounter Outcome:  No Answer   Care Coordination Interventions:  No, not indicated    Rowe Pavy, RN, BSN, [facility] [employer] [phone]

## 2022-11-02 ENCOUNTER — Telehealth: Payer: Self-pay

## 2022-11-02 NOTE — Patient Outreach (Signed)
  Care Coordination   11/02/2022 Name: Lawrence [name] MRN: [id number] DOB: January 03, 1966   Care Coordination Outreach Attempts:  A third unsuccessful outreach was attempted today to offer the patient with information about available care coordination services.  Follow Up Plan:  No further outreach attempts will be made at this time. We have been unable to contact the patient to offer or enroll patient in care coordination services  Encounter Outcome:  No Answer   Care Coordination Interventions:  No, not indicated    Rowe Pavy, RN, BSN, [facility] Coordinator [phone]

## 2022-12-15 DIAGNOSIS — E113512 Type 2 diabetes mellitus with proliferative diabetic retinopathy with macular edema, left eye: Secondary | ICD-10-CM | POA: Diagnosis not present

## 2022-12-27 DIAGNOSIS — K5904 Chronic idiopathic constipation: Secondary | ICD-10-CM | POA: Diagnosis not present

## 2022-12-27 DIAGNOSIS — D539 Nutritional anemia, unspecified: Secondary | ICD-10-CM | POA: Diagnosis not present

## 2022-12-27 DIAGNOSIS — I255 Ischemic cardiomyopathy: Secondary | ICD-10-CM | POA: Diagnosis not present

## 2022-12-27 DIAGNOSIS — E559 Vitamin D deficiency, unspecified: Secondary | ICD-10-CM | POA: Diagnosis not present

## 2022-12-27 DIAGNOSIS — E1169 Type 2 diabetes mellitus with other specified complication: Secondary | ICD-10-CM | POA: Diagnosis not present

## 2022-12-27 DIAGNOSIS — N481 Balanitis: Secondary | ICD-10-CM | POA: Diagnosis not present

## 2022-12-27 DIAGNOSIS — Z79899 Other long term (current) drug therapy: Secondary | ICD-10-CM | POA: Diagnosis not present

## 2022-12-27 DIAGNOSIS — I252 Old myocardial infarction: Secondary | ICD-10-CM | POA: Diagnosis not present

## 2022-12-27 DIAGNOSIS — E538 Deficiency of other specified B group vitamins: Secondary | ICD-10-CM | POA: Diagnosis not present

## 2022-12-27 DIAGNOSIS — E785 Hyperlipidemia, unspecified: Secondary | ICD-10-CM | POA: Diagnosis not present

## 2022-12-27 DIAGNOSIS — I10 Essential (primary) hypertension: Secondary | ICD-10-CM | POA: Diagnosis not present

## 2022-12-28 DIAGNOSIS — R262 Difficulty in walking, not elsewhere classified: Secondary | ICD-10-CM | POA: Diagnosis not present

## 2022-12-28 DIAGNOSIS — M6281 Muscle weakness (generalized): Secondary | ICD-10-CM | POA: Diagnosis not present

## 2022-12-28 DIAGNOSIS — R2689 Other abnormalities of gait and mobility: Secondary | ICD-10-CM | POA: Diagnosis not present

## 2022-12-29 DIAGNOSIS — R262 Difficulty in walking, not elsewhere classified: Secondary | ICD-10-CM | POA: Diagnosis not present

## 2022-12-29 DIAGNOSIS — M6281 Muscle weakness (generalized): Secondary | ICD-10-CM | POA: Diagnosis not present

## 2022-12-29 DIAGNOSIS — R2689 Other abnormalities of gait and mobility: Secondary | ICD-10-CM | POA: Diagnosis not present

## 2022-12-31 ENCOUNTER — Telehealth (HOSPITAL_COMMUNITY): Payer: Self-pay | Admitting: Cardiology

## 2022-12-31 DIAGNOSIS — I5022 Chronic systolic (congestive) heart failure: Secondary | ICD-10-CM

## 2022-12-31 MED ORDER — ENTRESTO 49-51 MG PO TABS: 1.0000 | ORAL_TABLET | Freq: Two times a day (BID) | ORAL | 6 refills | Status: DC

## 2022-12-31 NOTE — Telephone Encounter (Signed)
Pt aware.

## 2022-12-31 NOTE — Telephone Encounter (Signed)
Patient called with concerns of elevated kidney function. Reports he was seen by PCP and told increased caused by heart medications Would like labs repeated and to discuss meds further with provider   -advised mild elevation can be noted with cardiology medication regimen however we normally follow closely. Will forward to provider for BMET order. -advised for pcp to send copy of labs   Pt has increased concerns as brother is on dialysis

## 2022-12-31 NOTE — Telephone Encounter (Addendum)
Labs received Per Shanda Bumps [place],NP Decrease entresto to 49/51 mg BID And repeat bmet in 2 weeks

## 2023-01-03 ENCOUNTER — Encounter (HOSPITAL_COMMUNITY): Payer: Self-pay

## 2023-01-03 ENCOUNTER — Ambulatory Visit (HOSPITAL_COMMUNITY): Payer: PPO

## 2023-01-03 ENCOUNTER — Emergency Department (HOSPITAL_COMMUNITY)
Admission: EM | Admit: 2023-01-03 | Discharge: 2023-01-04 | Disposition: A | Discharge status: Home / Self Care | Payer: PPO | Source: Home / Self Care | Attending: Emergency Medicine | Admitting: Emergency Medicine

## 2023-01-03 ENCOUNTER — Other Ambulatory Visit: Payer: Self-pay

## 2023-01-03 DIAGNOSIS — Z79899 Other long term (current) drug therapy: Secondary | ICD-10-CM | POA: Diagnosis not present

## 2023-01-03 DIAGNOSIS — Z7984 Long term (current) use of oral hypoglycemic drugs: Secondary | ICD-10-CM | POA: Diagnosis not present

## 2023-01-03 DIAGNOSIS — K5900 Constipation, unspecified: Secondary | ICD-10-CM | POA: Diagnosis not present

## 2023-01-03 DIAGNOSIS — E119 Type 2 diabetes mellitus without complications: Secondary | ICD-10-CM | POA: Diagnosis not present

## 2023-01-03 DIAGNOSIS — Z7901 Long term (current) use of anticoagulants: Secondary | ICD-10-CM | POA: Diagnosis not present

## 2023-01-03 DIAGNOSIS — I10 Essential (primary) hypertension: Secondary | ICD-10-CM | POA: Diagnosis not present

## 2023-01-03 LAB — URINALYSIS, ROUTINE W REFLEX MICROSCOPIC
Bacteria, UA: NONE SEEN
Bilirubin Urine: NEGATIVE
Glucose, UA: >=500 mg/dL — AB
Hgb urine dipstick: NEGATIVE
Ketones, ur: NEGATIVE mg/dL
Leukocytes,Ua: NEGATIVE
Nitrite: NEGATIVE
Protein, ur: NEGATIVE mg/dL
Specific Gravity, Urine: 1.005 (ref 1.005–1.030)
pH: 6 (ref 5.0–8.0)

## 2023-01-03 LAB — CBC WITH DIFFERENTIAL/PLATELET
Abs Immature Granulocytes: 0.02 10*3/uL (ref 0.00–0.07)
Basophils Absolute: 0.1 10*3/uL (ref 0.0–0.1)
Basophils Relative: 1 %
Eosinophils Absolute: 0.4 10*3/uL (ref 0.0–0.5)
Eosinophils Relative: 4 %
HCT: 37.9 % — ABNORMAL LOW (ref 39.0–52.0)
Hemoglobin: 12.4 g/dL — ABNORMAL LOW (ref 13.0–17.0)
Immature Granulocytes: 0 %
Lymphocytes Relative: 24 %
Lymphs Abs: 2.1 10*3/uL (ref 0.7–4.0)
MCH: 29.7 pg (ref 26.0–34.0)
MCHC: 32.7 g/dL (ref 30.0–36.0)
MCV: 90.7 fL (ref 80.0–100.0)
Monocytes Absolute: 0.7 10*3/uL (ref 0.1–1.0)
Monocytes Relative: 8 %
Neutro Abs: 5.5 10*3/uL (ref 1.7–7.7)
Neutrophils Relative %: 63 %
Platelets: 233 10*3/uL (ref 150–400)
RBC: 4.18 MIL/uL — ABNORMAL LOW (ref 4.22–5.81)
RDW: 12.5 % (ref 11.5–15.5)
WBC: 8.8 10*3/uL (ref 4.0–10.5)
nRBC: 0 % (ref 0.0–0.2)

## 2023-01-03 LAB — COMPREHENSIVE METABOLIC PANEL
ALT: 16 U/L (ref 0–44)
AST: 15 U/L (ref 15–41)
Albumin: 4.4 g/dL (ref 3.5–5.0)
Alkaline Phosphatase: 76 U/L (ref 38–126)
Anion gap: 12 (ref 5–15)
BUN: 19 mg/dL (ref 6–20)
CO2: 22 mmol/L (ref 22–32)
Calcium: 8.7 mg/dL — ABNORMAL LOW (ref 8.9–10.3)
Chloride: 98 mmol/L (ref 98–111)
Creatinine, Ser: 1.3 mg/dL — ABNORMAL HIGH (ref 0.61–1.24)
GFR, Estimated: >60 mL/min (ref >60)
Glucose, Bld: 120 mg/dL — ABNORMAL HIGH (ref 70–99)
Potassium: 5.2 mmol/L — ABNORMAL HIGH (ref 3.5–5.1)
Sodium: 132 mmol/L — ABNORMAL LOW (ref 135–145)
Total Bilirubin: 0.5 mg/dL (ref 0.3–1.2)
Total Protein: 7.6 g/dL (ref 6.5–8.1)

## 2023-01-03 LAB — LIPASE, BLOOD: Lipase: 59 U/L — ABNORMAL HIGH (ref 11–51)

## 2023-01-03 LAB — MAGNESIUM: Magnesium: 2.9 mg/dL — ABNORMAL HIGH (ref 1.7–2.4)

## 2023-01-03 NOTE — ED Provider Triage Note (Signed)
Emergency Medicine Provider Triage Evaluation Note  Lawrence [name] , a 57 y.o. male  was evaluated in triage.  Pt complains of constipation for the past 8 days.  Patient dates he has history of hyper magnesium and is concerned this is causing his constipation.  Patient dates that he has bloating to his abdomen with mild nausea but is not currently nauseous.  Patient denies fevers, chest pain, shortness of breath, change in sensation/motor skills, leg swelling.  Patient states he has not been able to have a bowel movement although he has been taking a laxative every day but is unsure of which laxative.  Review of Systems  Positive: See HPI Negative: See HPI  Physical Exam  BP (!) 145/98 (BP Location: Right Arm)   Pulse 82   Temp 99.2 F (37.3 C) (Oral)   Resp 16   Wt 90.7 kg   SpO2 100%   BMI 30.41 kg/m  Gen:   Awake, no distress   Resp:  Normal effort  MSK:   Moves extremities without difficulty  Other:  Abdomen nontender to palpation without peritoneal signs, abdomen none distended, no overlying skin color changes  Medical Decision Making  Medically screening exam initiated at 5:34 PM.  Appropriate orders placed.  SAHEED PONTRELLI was informed that the remainder of the evaluation will be completed by another provider, this initial triage assessment does not replace that evaluation, and the importance of remaining in the ED until their evaluation is complete.  Workup initiated, patient stable at this time   Remi Deter 01/03/23 1736

## 2023-01-03 NOTE — ED Triage Notes (Signed)
Pt arrives with c/o constipation. Pts last BM was 8 days ago. Pt endorses bloating to his ABD and nausea. Pt denies fevers.

## 2023-01-04 ENCOUNTER — Emergency Department (HOSPITAL_COMMUNITY): Payer: PPO

## 2023-01-04 ENCOUNTER — Other Ambulatory Visit (HOSPITAL_COMMUNITY): Payer: Self-pay | Admitting: Cardiology

## 2023-01-04 DIAGNOSIS — K5900 Constipation, unspecified: Secondary | ICD-10-CM | POA: Diagnosis not present

## 2023-01-04 MED ORDER — FLEET ENEMA 7-19 GM/118ML RE ENEM
1.0000 | ENEMA | Freq: Once | RECTAL | Status: AC
  Administered 2023-01-04: 1 via RECTAL
  Filled 2023-01-04: qty 1

## 2023-01-04 MED ORDER — POLYETHYLENE GLYCOL 3350 17 GM/SCOOP PO POWD: 17.0000 g | Freq: Two times a day (BID) | ORAL | 0 refills | Status: DC

## 2023-01-04 NOTE — ED Provider Notes (Signed)
[place] EMERGENCY DEPARTMENT AT [facility] Provider Note   CSN: [id number] Arrival date & time: 01/03/23  1652     History  Chief Complaint  Patient presents with   Constipation    Lawrence [name] is a 57 y.o. male.  57 year old male with a history of diabetes, hypertension, hyperlipidemia, and chronic constipation presents to the emergency department for worsening constipation symptoms.  He reports issues with constipation since 2017 when he had a fall and was wheelchair-bound for approximately 1 year.  He started using an over-the-counter supplement to induce bowel movements which helped him for some time, but he began to notice some decreased efficacy a few months ago.  He went to see his doctor 1 week ago for this complaint and had blood work done.  Was told that his magnesium was very high and he needed to discontinue use of his supplements.  His doctor, instead, started him on Linzess.  He took 1 tablet as prescribed which did not induce a BM. Subsequently escalated to taking 3 tabs with no improvement. Has been having minimal watery BMs. Feels nauseated with abdominal bloating. No fevers, vomiting, melena, hematochezia. No prior abdominal surgeries.  The history is provided by the patient. No language interpreter was used.  Constipation      Home Medications Prior to Admission medications   Medication Sig Start Date End Date Taking? Authorizing Provider  polyethylene glycol powder (GLYCOLAX/MIRALAX) 17 GM/SCOOP powder Take 17 g by mouth 2 (two) times daily. Until daily soft stools  OTC 01/04/23  Yes Antony Madura, PA-C  albuterol (VENTOLIN HFA) 108 (90 Base) MCG/ACT inhaler as needed. 02/12/21   [provider]  blood glucose meter kit and supplies KIT Dispense based on patient and insurance preference. Use up to four times daily as directed. (FOR ICD-9 250.00, 250.01). 03/08/16   Montez Morita, PA-C  carvedilol (COREG) 12.5 MG tablet TAKE 1 & 1/2 (ONE &  ONE-HALF) TABLETS BY MOUTH TWICE DAILY WITH A MEAL 10/07/22   Laurey Morale, MD  clopidogrel (PLAVIX) 75 MG tablet Take 1 tablet (75 mg total) by mouth daily. 07/12/22   Laurey Morale, MD  empagliflozin (JARDIANCE) 25 MG TABS tablet Take 1 tablet (25 mg total) by mouth daily. 04/21/22   Laurey Morale, MD  ezetimibe (ZETIA) 10 MG tablet Take 1 tablet (10 mg total) by mouth daily. 05/04/22   Laurey Morale, MD  FREESTYLE PRECISION NEO TEST test strip USE 1 STRIP TO CHECK GLUCOSE ONCE DAILY 07/26/20   [provider]  metFORMIN (GLUCOPHAGE-XR) 500 MG 24 hr tablet Take 1,000 mg by mouth 2 (two) times daily. 10/22/20   [provider]  nitroGLYCERIN (NITROSTAT) 0.4 MG SL tablet Place 1 tablet (0.4 mg total) under the tongue every 5 (five) minutes as needed for chest pain. 02/16/21   Arty Baumgartner, NP  rosuvastatin (CRESTOR) 40 MG tablet Take 1 tablet (40 mg total) by mouth daily. 02/17/21   Arty Baumgartner, NP  sacubitril-valsartan (ENTRESTO) 49-51 MG Take 1 tablet by mouth 2 (two) times daily. 12/31/22   Jacklynn Ganong, FNP  spironolactone (ALDACTONE) 25 MG tablet Take 0.5 tablets (12.5 mg total) by mouth daily. 07/12/22   Laurey Morale, MD      Allergies    Spironolactone    Review of Systems   Review of Systems  Gastrointestinal:  Positive for constipation.  Ten systems reviewed and are negative for acute change, except as noted in the HPI.  Physical Exam Updated Vital Signs BP 117/78   Pulse 77   Temp 98.3 F (36.8 C) (Oral)   Resp 17   Wt 90.7 kg   SpO2 98%   BMI 30.41 kg/m   Physical Exam Vitals and nursing note reviewed.  Constitutional:      General: He is not in acute distress.    Appearance: He is well-developed. He is not diaphoretic.     Comments: Nontoxic appearing and in NAD  HENT:     Head: Normocephalic and atraumatic.  Eyes:     General: No scleral icterus.    Conjunctiva/sclera: Conjunctivae normal.  Pulmonary:     Effort:  Pulmonary effort is normal. No respiratory distress.     Comments: Respirations even and unlabored Abdominal:     Palpations: Abdomen is soft.     Comments: Abdomen soft. There is generalized TTP on exam which is mild/minimal. No focal TTP, palpable masses, peritoneal signs.  Musculoskeletal:        General: Normal range of motion.     Cervical back: Normal range of motion.  Skin:    General: Skin is warm and dry.     Coloration: Skin is not pale.     Findings: No erythema or rash.  Neurological:     Mental Status: He is alert and oriented to person, place, and time.     Coordination: Coordination normal.  Psychiatric:        Behavior: Behavior normal.     ED Results / Procedures / Treatments   Labs (all labs ordered are listed, but only abnormal results are displayed) Labs Reviewed  CBC WITH DIFFERENTIAL/PLATELET - Abnormal; Notable for the following components:      Result Value   RBC 4.18 (*)    Hemoglobin 12.4 (*)    HCT 37.9 (*)    All other components within normal limits  COMPREHENSIVE METABOLIC PANEL - Abnormal; Notable for the following components:   Sodium 132 (*)    Potassium 5.2 (*)    Glucose, Bld 120 (*)    Creatinine, Ser 1.30 (*)    Calcium 8.7 (*)    All other components within normal limits  LIPASE, BLOOD - Abnormal; Notable for the following components:   Lipase 59 (*)    All other components within normal limits  URINALYSIS, ROUTINE W REFLEX MICROSCOPIC - Abnormal; Notable for the following components:   Color, Urine STRAW (*)    Glucose, UA >=500 (*)    All other components within normal limits  MAGNESIUM - Abnormal; Notable for the following components:   Magnesium 2.9 (*)    All other components within normal limits    EKG None  Radiology DG Abd 2 Views  Result Date: 01/04/2023 CLINICAL DATA:  [id number] Constipation [id number] EXAM: ABDOMEN - 2 VIEW COMPARISON:  None Available. FINDINGS: The bowel gas pattern is normal. Stool noted within the  ascending colon. Stool noted within sigmoid colon. There is no evidence of free air. No radio-opaque calculi or other significant radiographic abnormality is seen. IMPRESSION: Nonobstructive bowel gas pattern. Electronically Signed   By: Tish Frederickson M.D.   On: 01/04/2023 01:00    Procedures Procedures    Medications Ordered in ED Medications  sodium phosphate (FLEET) 7-19 GM/118ML enema 1 enema (1 enema Rectal Given 01/04/23 0120)    ED Course/ Medical Decision Making/ A&P Clinical Course as of 01/04/23 0248  Tue Jan 04, 2023  0128 I have viewed and interpreted the patient's abdominal x-ray.  There is no evidence of obstruction or free air.  There is evidence of stool primarily in the descending and sigmoid colon.  Will attempt symptomatic relief with enema. [KH]  0145 Patient on commode attempting to have BM [KH]  0210 Patient with small watery BM in room. Some colicky abdominal pain post BM. [KH]  0229 Patient reports feeling some relief after the enema. [KH]    Clinical Course User Index [KH] Antony Madura, PA-C                             Medical Decision Making Amount and/or Complexity of Data Reviewed Radiology: ordered.  Risk OTC drugs.   This patient presents to the ED for concern of constipation, this involves an extensive number of treatment options, and is a complaint that carries with it a high risk of complications and morbidity.  The differential diagnosis includes constipation vs pSBO vs SBO vs fecal impaction   Co morbidities that complicate the patient evaluation  DM HTN HLD   Additional history obtained:  External records from outside source obtained and reviewed including colonoscopy in 2020 which was normal   Lab Tests:  I Ordered, and personally interpreted labs.  The pertinent results include:  Hgb 12.4, Potassium 5.2, Creatinine 1.3 (stable), Mg 2.9, Lipase 59   Imaging Studies ordered:  I ordered imaging studies including Xray abdomen   I independently visualized and interpreted imaging which showed no signs of free air or obstruction I agree with the radiologist interpretation   Cardiac Monitoring:  The patient was maintained on a cardiac monitor.  I personally viewed and interpreted the cardiac monitored which showed an underlying rhythm of: NSR   Medicines ordered and prescription drug management:  I ordered medication including fleet enema for constipation  Reevaluation of the patient after these medicines showed that the patient improved I have reviewed the patients home medicines and have made adjustments as needed   Test Considered:  CT abdomen pelvis - however, chronic issue w/o signs of acute surgical abdomen on exam.   Problem List / ED Course:  As above   Reevaluation:  After the interventions noted above, I reevaluated the patient and found that they have :improved   Social Determinants of Health:  Ambulatory with assist device; cane   Dispostion:  After consideration of the diagnostic results and the patients response to treatment, I feel that the patent would benefit from addition of Miralax and GI referral. Encouraged continued f/u with his PCP in the interim. Return precautions discussed and provided. Patient discharged in stable condition with no unaddressed concerns.          Final Clinical Impression(s) / ED Diagnoses Final diagnoses:  Constipation, unspecified constipation type    Rx / DC Orders ED Discharge Orders          Ordered    Ambulatory referral to Gastroenterology        01/04/23 0242    polyethylene glycol powder (GLYCOLAX/MIRALAX) 17 GM/SCOOP powder  2 times daily        01/04/23 0242              Antony Madura, PA-C 01/04/23 0253    Gilda Crease, MD 01/04/23 [phone]

## 2023-01-04 NOTE — Discharge Instructions (Addendum)
You have been given a referral to gastroenterology.  Call to schedule a follow-up appointment.  We also recommend continuing Linzess and using this with MiraLAX as prescribed.  Return for new or concerning symptoms.

## 2023-01-04 NOTE — ED Notes (Signed)
Patient verbalizes understanding of discharge instructions. Opportunity for questioning and answers were provided. Armband removed by staff, pt discharged from ED. Pt ambulatory to ED waiting room with steady gait.  

## 2023-01-04 NOTE — ED Notes (Signed)
Pt transported to xray 

## 2023-01-04 NOTE — ED Notes (Signed)
Pt on BSC, attempting bowel movement.

## 2023-01-05 ENCOUNTER — Telehealth: Payer: Self-pay | Admitting: Gastroenterology

## 2023-01-05 NOTE — Telephone Encounter (Signed)
Good morning Dr Adela Lank  Supervising MD AM  We receive a request from patient wanting to transfer GI care back to [place]. Patient was a previous patient of Dr Christella Hartigan  in 2020 but was seen with [facility] in September of 2023.   Patient has a referral in for constipation and was seen in the ED on 7/29.  Patient requesting to transfer care back due to his insurance being ou of network. Please review and advise on scheduling.   Thank you

## 2023-01-05 NOTE — Telephone Encounter (Signed)
Okay to book for a new patient visit

## 2023-01-07 DIAGNOSIS — K5904 Chronic idiopathic constipation: Secondary | ICD-10-CM | POA: Diagnosis not present

## 2023-01-07 DIAGNOSIS — E1143 Type 2 diabetes mellitus with diabetic autonomic (poly)neuropathy: Secondary | ICD-10-CM | POA: Diagnosis not present

## 2023-01-07 DIAGNOSIS — M6281 Muscle weakness (generalized): Secondary | ICD-10-CM | POA: Diagnosis not present

## 2023-01-07 DIAGNOSIS — R262 Difficulty in walking, not elsewhere classified: Secondary | ICD-10-CM | POA: Diagnosis not present

## 2023-01-07 DIAGNOSIS — R2689 Other abnormalities of gait and mobility: Secondary | ICD-10-CM | POA: Diagnosis not present

## 2023-01-10 ENCOUNTER — Other Ambulatory Visit (HOSPITAL_COMMUNITY): Payer: Self-pay | Admitting: Cardiology

## 2023-01-11 ENCOUNTER — Ambulatory Visit (HOSPITAL_COMMUNITY)
Admission: RE | Admit: 2023-01-11 | Discharge: 2023-01-11 | Disposition: A | Discharge status: Home / Self Care | Payer: PPO | Source: Ambulatory Visit | Attending: Cardiology | Admitting: Cardiology

## 2023-01-11 DIAGNOSIS — I5022 Chronic systolic (congestive) heart failure: Secondary | ICD-10-CM | POA: Insufficient documentation

## 2023-01-11 DIAGNOSIS — K5909 Other constipation: Secondary | ICD-10-CM | POA: Diagnosis not present

## 2023-01-11 LAB — BASIC METABOLIC PANEL
Anion gap: 7 (ref 5–15)
BUN: 24 mg/dL — ABNORMAL HIGH (ref 6–20)
CO2: 24 mmol/L (ref 22–32)
Calcium: 9.1 mg/dL (ref 8.9–10.3)
Chloride: 103 mmol/L (ref 98–111)
Creatinine, Ser: 1.26 mg/dL — ABNORMAL HIGH (ref 0.61–1.24)
GFR, Estimated: >60 mL/min (ref >60)
Glucose, Bld: 212 mg/dL — ABNORMAL HIGH (ref 70–99)
Potassium: 4.8 mmol/L (ref 3.5–5.1)
Sodium: 134 mmol/L — ABNORMAL LOW (ref 135–145)

## 2023-01-12 ENCOUNTER — Encounter: Payer: Self-pay | Admitting: Gastroenterology

## 2023-01-12 DIAGNOSIS — R2689 Other abnormalities of gait and mobility: Secondary | ICD-10-CM | POA: Diagnosis not present

## 2023-01-12 DIAGNOSIS — M6281 Muscle weakness (generalized): Secondary | ICD-10-CM | POA: Diagnosis not present

## 2023-01-12 DIAGNOSIS — R262 Difficulty in walking, not elsewhere classified: Secondary | ICD-10-CM | POA: Diagnosis not present

## 2023-01-19 ENCOUNTER — Encounter (HOSPITAL_COMMUNITY): Payer: PPO

## 2023-01-19 DIAGNOSIS — R262 Difficulty in walking, not elsewhere classified: Secondary | ICD-10-CM | POA: Diagnosis not present

## 2023-01-19 DIAGNOSIS — R2689 Other abnormalities of gait and mobility: Secondary | ICD-10-CM | POA: Diagnosis not present

## 2023-01-19 DIAGNOSIS — M6281 Muscle weakness (generalized): Secondary | ICD-10-CM | POA: Diagnosis not present

## 2023-01-25 DIAGNOSIS — K5909 Other constipation: Secondary | ICD-10-CM | POA: Diagnosis not present

## 2023-01-26 DIAGNOSIS — R2689 Other abnormalities of gait and mobility: Secondary | ICD-10-CM | POA: Diagnosis not present

## 2023-01-26 DIAGNOSIS — M6281 Muscle weakness (generalized): Secondary | ICD-10-CM | POA: Diagnosis not present

## 2023-01-26 DIAGNOSIS — R262 Difficulty in walking, not elsewhere classified: Secondary | ICD-10-CM | POA: Diagnosis not present

## 2023-02-01 DIAGNOSIS — K5909 Other constipation: Secondary | ICD-10-CM | POA: Diagnosis not present

## 2023-02-02 DIAGNOSIS — R262 Difficulty in walking, not elsewhere classified: Secondary | ICD-10-CM | POA: Diagnosis not present

## 2023-02-02 DIAGNOSIS — M6281 Muscle weakness (generalized): Secondary | ICD-10-CM | POA: Diagnosis not present

## 2023-02-02 DIAGNOSIS — R2689 Other abnormalities of gait and mobility: Secondary | ICD-10-CM | POA: Diagnosis not present

## 2023-02-09 DIAGNOSIS — R2689 Other abnormalities of gait and mobility: Secondary | ICD-10-CM | POA: Diagnosis not present

## 2023-02-09 DIAGNOSIS — R262 Difficulty in walking, not elsewhere classified: Secondary | ICD-10-CM | POA: Diagnosis not present

## 2023-02-09 DIAGNOSIS — M6281 Muscle weakness (generalized): Secondary | ICD-10-CM | POA: Diagnosis not present

## 2023-02-14 DIAGNOSIS — R2689 Other abnormalities of gait and mobility: Secondary | ICD-10-CM | POA: Diagnosis not present

## 2023-02-14 DIAGNOSIS — M6281 Muscle weakness (generalized): Secondary | ICD-10-CM | POA: Diagnosis not present

## 2023-02-14 DIAGNOSIS — R262 Difficulty in walking, not elsewhere classified: Secondary | ICD-10-CM | POA: Diagnosis not present

## 2023-02-17 DIAGNOSIS — K5909 Other constipation: Secondary | ICD-10-CM | POA: Diagnosis not present

## 2023-02-21 ENCOUNTER — Ambulatory Visit (HOSPITAL_COMMUNITY)
Admission: RE | Admit: 2023-02-21 | Discharge: 2023-02-21 | Disposition: A | Discharge status: Home / Self Care | Payer: PPO | Source: Ambulatory Visit | Attending: Family Medicine | Admitting: Family Medicine

## 2023-02-21 ENCOUNTER — Encounter (HOSPITAL_COMMUNITY): Payer: Self-pay

## 2023-02-21 VITALS — BP 108/76 | HR 79 | Wt 199.0 lb

## 2023-02-21 DIAGNOSIS — I10 Essential (primary) hypertension: Secondary | ICD-10-CM

## 2023-02-21 DIAGNOSIS — I252 Old myocardial infarction: Secondary | ICD-10-CM | POA: Diagnosis not present

## 2023-02-21 DIAGNOSIS — Z7984 Long term (current) use of oral hypoglycemic drugs: Secondary | ICD-10-CM | POA: Diagnosis not present

## 2023-02-21 DIAGNOSIS — E11319 Type 2 diabetes mellitus with unspecified diabetic retinopathy without macular edema: Secondary | ICD-10-CM | POA: Insufficient documentation

## 2023-02-21 DIAGNOSIS — I255 Ischemic cardiomyopathy: Secondary | ICD-10-CM | POA: Insufficient documentation

## 2023-02-21 DIAGNOSIS — Z79899 Other long term (current) drug therapy: Secondary | ICD-10-CM | POA: Diagnosis not present

## 2023-02-21 DIAGNOSIS — Z7902 Long term (current) use of antithrombotics/antiplatelets: Secondary | ICD-10-CM | POA: Insufficient documentation

## 2023-02-21 DIAGNOSIS — B9689 Other specified bacterial agents as the cause of diseases classified elsewhere: Secondary | ICD-10-CM | POA: Diagnosis not present

## 2023-02-21 DIAGNOSIS — R2689 Other abnormalities of gait and mobility: Secondary | ICD-10-CM | POA: Diagnosis not present

## 2023-02-21 DIAGNOSIS — I2510 Atherosclerotic heart disease of native coronary artery without angina pectoris: Secondary | ICD-10-CM | POA: Insufficient documentation

## 2023-02-21 DIAGNOSIS — I110 Hypertensive heart disease with heart failure: Secondary | ICD-10-CM | POA: Insufficient documentation

## 2023-02-21 DIAGNOSIS — M25569 Pain in unspecified knee: Secondary | ICD-10-CM | POA: Diagnosis not present

## 2023-02-21 DIAGNOSIS — G8929 Other chronic pain: Secondary | ICD-10-CM | POA: Diagnosis not present

## 2023-02-21 DIAGNOSIS — N481 Balanitis: Secondary | ICD-10-CM | POA: Diagnosis not present

## 2023-02-21 DIAGNOSIS — R262 Difficulty in walking, not elsewhere classified: Secondary | ICD-10-CM | POA: Diagnosis not present

## 2023-02-21 DIAGNOSIS — Z955 Presence of coronary angioplasty implant and graft: Secondary | ICD-10-CM | POA: Diagnosis not present

## 2023-02-21 DIAGNOSIS — E785 Hyperlipidemia, unspecified: Secondary | ICD-10-CM | POA: Diagnosis not present

## 2023-02-21 DIAGNOSIS — I5022 Chronic systolic (congestive) heart failure: Secondary | ICD-10-CM | POA: Insufficient documentation

## 2023-02-21 DIAGNOSIS — E119 Type 2 diabetes mellitus without complications: Secondary | ICD-10-CM

## 2023-02-21 DIAGNOSIS — M6281 Muscle weakness (generalized): Secondary | ICD-10-CM | POA: Diagnosis not present

## 2023-02-21 LAB — LIPID PANEL
Cholesterol: 254 mg/dL — ABNORMAL HIGH (ref 0–200)
HDL: 29 mg/dL — ABNORMAL LOW (ref >40)
LDL Cholesterol: 157 mg/dL — ABNORMAL HIGH (ref 0–99)
Total CHOL/HDL Ratio: 8.8 ratio
Triglycerides: 339 mg/dL — ABNORMAL HIGH (ref <150)
VLDL: 68 mg/dL — ABNORMAL HIGH (ref 0–40)

## 2023-02-21 LAB — BASIC METABOLIC PANEL
Anion gap: 12 (ref 5–15)
BUN: 34 mg/dL — ABNORMAL HIGH (ref 6–20)
CO2: 23 mmol/L (ref 22–32)
Calcium: 9.1 mg/dL (ref 8.9–10.3)
Chloride: 102 mmol/L (ref 98–111)
Creatinine, Ser: 1.41 mg/dL — ABNORMAL HIGH (ref 0.61–1.24)
GFR, Estimated: 58 mL/min — ABNORMAL LOW (ref >60)
Glucose, Bld: 178 mg/dL — ABNORMAL HIGH (ref 70–99)
Potassium: 4.5 mmol/L (ref 3.5–5.1)
Sodium: 137 mmol/L (ref 135–145)

## 2023-02-21 LAB — MAGNESIUM: Magnesium: 2.4 mg/dL (ref 1.7–2.4)

## 2023-02-21 LAB — BRAIN NATRIURETIC PEPTIDE: B Natriuretic Peptide: 28.1 pg/mL (ref 0.0–100.0)

## 2023-02-21 MED ORDER — CLOTRIMAZOLE 1 % EX CREA: TOPICAL_CREAM | Freq: Two times a day (BID) | CUTANEOUS | Status: DC

## 2023-02-21 MED ORDER — FUROSEMIDE 40 MG PO TABS: 40.0000 mg | ORAL_TABLET | Freq: Every day | ORAL | 3 refills | Status: DC

## 2023-02-21 MED ORDER — FLUCONAZOLE 150 MG PO TABS: 150.0000 mg | ORAL_TABLET | Freq: Once | ORAL | 0 refills | Status: AC

## 2023-02-21 NOTE — Patient Instructions (Signed)
Stop Jardiance. Start Lasix 40 mg daily - Rx sent to local pharmacy. Use Trimazole cream on affected area for 7 - 14 days. Rx sent to local pharmacy. Take fluconazole 150 mg tablet x one dose - Rx sent to local pharmacy. Labs today - will call you if abnormal. Repeat labs in 10 - 14 days. See details below. Return to see Dr. Shirlee Latch in [facility] Failure Clinic in 3 months - see below. Please call us at [phone] if any questions or concerns prior to your next visit.

## 2023-02-21 NOTE — Progress Notes (Signed)
ADVANCED HF CLINIC NOTE  Primary Care: Doreene Eland, MD HF Cardiologist: Dr. Shirlee Latch   HPI: Lawrence [name] is a 57 y.o. Hispanic male with a history of chronic knee pain from crush injury, DM, HTN, hyperlipidemia, CAD, ischemic cardiomyopathy.    Admitted 02/12/2021 with chest pain in the setting of STEMI. Had LHC and underwent successful PTCA/DES x 1 mid LAD and successful PTCA/DES x 1 obtuse marginal.  Echo in 9/22 showed EF 30-35%. CMRI with EF 31% and unable to assess viability due to acute MI, no thrombus. Discharged with LifeVest. Discharged on losartan, coreg, jardiance, and spironolactone.     Echo in 12/22 showed EF 30-35%, septal/apical severe hypokinesis, normal RV.  Patient wanted to wait a few more months and repeat echo before committing to ICD.  Repeat echo in 3/23 with EF still 350-35%, septal/apical severe HK, RV normal.  Patient decided he did not want ICD and took off Lifevest.   Echo 3/24 showed EF stable 35-40%.  Today he returns for HF follow up with his wife. Overall feeling fine. He is limited by chronic knee pain, not SOB walking up steps, walks with a cane. He walks daily for 30 minutes for exercise. Denies palpitations, abnormal bleeding, CP, dizziness, edema, or PND/Orthopnea. Appetite ok. Weight at home 190 pounds. Taking all medications. No tobacco/ETOH/drugs. Main complaint is dry/cracking skin on glans on penis.  ECG (personally reviewed): NSR 79 bpm  Labs (10/22): K 4.6, creatinine 0.99 Labs (11/22): LDL 76, TGs 186 Labs (12/22): BNP 85, K 4.6, creatinine 1.22 Labs (3/23): K 4.4, creatinine 0.93, hgb 12.3 Labs (5/23): K 5.1, creatinine 1.02, LDL 92, HDL 38, TGs 150 Labs (8/23): K 4.7, creatinine 1.34 Labs (2/24): K 4.7, creatinine 1.11, LDL 48   SH: Lives with his wife and child. Disabled from previous knee injury. He does not drink alcohol. He does not smoke.   PMH: 1. Type 2 diabetes 2. HTN 3. Hyperlipidemia 4. CAD: Anterior MI in 9/22 with DES to  mLAD and DES to OM2.  5. Chronic systolic CHF: Ischemic cardiomyopathy.   - Echo (9/22): EF 30-35%, normal RV.  - Cardiac MRI (9/22): No thrombus. LGE consistent with myocardial infarction in portion of the mid anterior/anteroseptal Unable to assess for viability in setting of acute MI (LGE overestimates final infarct size in setting of acute MI). EF 31%. Akinesis of mid anterior/anteroseptal walls, apical anterior/septal/inferior walls, and apex. RV normal.  - Echo (12/22): EF 30-35%, septal/apical severe hypokinesis, normal RV. - Echo (3/24): EF 35-40%, RV normal 6. Diabetic retinopathy   Review of Systems: All systems reviewed and negative except as per HPI.   Current Outpatient Medications  Medication Sig Dispense Refill   albuterol (VENTOLIN HFA) 108 (90 Base) MCG/ACT inhaler as needed.     blood glucose meter kit and supplies KIT Dispense based on patient and insurance preference. Use up to four times daily as directed. (FOR ICD-9 250.00, 250.01). 1 each 0   carvedilol (COREG) 12.5 MG tablet TAKE 1 & 1/2 (ONE & ONE-HALF) TABLETS BY MOUTH TWICE DAILY WITH MEALS 180 tablet 3   clopidogrel (PLAVIX) 75 MG tablet Take 1 tablet (75 mg total) by mouth daily. 90 tablet 3   empagliflozin (JARDIANCE) 25 MG TABS tablet Take 1 tablet (25 mg total) by mouth daily. 30 tablet 11   ezetimibe (ZETIA) 10 MG tablet Take 1 tablet (10 mg total) by mouth daily. 90 tablet 3   FREESTYLE PRECISION NEO TEST test strip USE 1 STRIP  TO CHECK GLUCOSE ONCE DAILY     metFORMIN (GLUCOPHAGE-XR) 500 MG 24 hr tablet Take 1,000 mg by mouth 2 (two) times daily.     nitroGLYCERIN (NITROSTAT) 0.4 MG SL tablet Place 1 tablet (0.4 mg total) under the tongue every 5 (five) minutes as needed for chest pain. 25 tablet 2   polyethylene glycol powder (GLYCOLAX/MIRALAX) 17 GM/SCOOP powder Take 17 g by mouth 2 (two) times daily. Until daily soft stools  OTC 255 g 0   rosuvastatin (CRESTOR) 40 MG tablet Take 1 tablet (40 mg total) by  mouth daily. 90 tablet 1   sacubitril-valsartan (ENTRESTO) 49-51 MG Take 1 tablet by mouth 2 (two) times daily. 60 tablet 6   spironolactone (ALDACTONE) 25 MG tablet Take 0.5 tablets (12.5 mg total) by mouth daily. 45 tablet 3   No current facility-administered medications for this encounter.   Allergies  Allergen Reactions   Spironolactone     Hyperkalemia   Family History  Problem Relation Age of Onset   Cancer Mother    Cancer Father    BP 108/76   Pulse 79   Wt 90.3 kg (199 lb)   SpO2 99%   BMI 30.26 kg/m   Wt Readings from Last 3 Encounters:  02/21/23 90.3 kg (199 lb)  01/03/23 90.7 kg (200 lb)  10/11/22 92.1 kg (203 lb)   PHYSICAL EXAM: General:  NAD. No resp difficulty, walked into clinic with cane HEENT: Normal Neck: Supple. No JVD. Carotids 2+ bilat; no bruits. No lymphadenopathy or thryomegaly appreciated. Cor: PMI nondisplaced. Regular rate & rhythm. No rubs, gallops or murmurs. Lungs: Clear Abdomen: Soft, nontender, nondistended. No hepatosplenomegaly. No bruits or masses. Good bowel sounds. Extremities: No cyanosis, clubbing, rash, edema Neuro: Alert & oriented x 3, cranial nerves grossly intact. Moves all 4 extremities w/o difficulty. Affect pleasant. GU:  Inflamed and erythematous glans with white discharge; uncircumcised   ASSESSMENT & PLAN: 1. Chronic systolic CHF:  Ischemic cardiomyopathy.  Echo in 9/22 with EF 30-35%.  Repeat echo in 12/22 showed EF still in the 30-35% range with normal RV.  Repeat echo in 3/23 with EF still 30-35%.  He decided against ICD placement after extensive discussion and removed LifeVest.  Echo (3/24) showed EF 35-40%.  He is not volume overloaded on exam, NYHA class I-II.  - With balanitis, stop Jardiance and start Lasix 40 mg daily. BMET/BNP today, repeat BMET in 7-10 days - Continue Entresto 49/51 bid.  - Continue spironolactone 12.5 mg daily (hyperkalemic on higher dose).   - Continue Coreg 18.75 mg bid.  2. CAD: Anterior  STEMI in 9/22 with severe mLAD stenosis treated with PCI/DES x1, and severe OM stenosis treated with PCI/DES x1, non dominant RCA with mild non-obstructive disease.  No chest pain.   - Continue Plavix 75 mg daily for long-term.  - Continue Crestor 40 mg daily and Zetia 10 mg daily. 3. HTN: BP controlled.  4. Hyperlipidemia: Continue Crestor and Zetia, good lipids 2/24. Repeat lipids today. 5. DM II: Per PCP. - Stopping SGLT2i as above - Will forward to PCP 6. Balanitis: Stop SGLT2i. - Give fluconazole 150 mg po x 1 - Rx for clotrimazole 1% cream. Needs PCP follow up if not resolved.  Follow up in 4 months with Dr. Shirlee Latch.  [facility] FNP-BC 02/21/2023

## 2023-02-23 ENCOUNTER — Other Ambulatory Visit (HOSPITAL_COMMUNITY): Payer: Self-pay

## 2023-02-23 MED ORDER — CLOTRIMAZOLE 1 % EX CREA: 1.0000 | TOPICAL_CREAM | Freq: Every day | CUTANEOUS | 0 refills | Status: AC

## 2023-02-28 ENCOUNTER — Encounter (HOSPITAL_COMMUNITY): Payer: PPO | Admitting: Cardiology

## 2023-03-01 DIAGNOSIS — R262 Difficulty in walking, not elsewhere classified: Secondary | ICD-10-CM | POA: Diagnosis not present

## 2023-03-01 DIAGNOSIS — M6281 Muscle weakness (generalized): Secondary | ICD-10-CM | POA: Diagnosis not present

## 2023-03-01 DIAGNOSIS — R2689 Other abnormalities of gait and mobility: Secondary | ICD-10-CM | POA: Diagnosis not present

## 2023-03-01 DIAGNOSIS — K5909 Other constipation: Secondary | ICD-10-CM | POA: Diagnosis not present

## 2023-03-07 ENCOUNTER — Ambulatory Visit (HOSPITAL_COMMUNITY)
Admission: RE | Admit: 2023-03-07 | Discharge: 2023-03-07 | Disposition: A | Discharge status: Home / Self Care | Payer: PPO | Source: Ambulatory Visit | Attending: Internal Medicine | Admitting: Internal Medicine

## 2023-03-07 DIAGNOSIS — I5022 Chronic systolic (congestive) heart failure: Secondary | ICD-10-CM | POA: Insufficient documentation

## 2023-03-07 DIAGNOSIS — N481 Balanitis: Secondary | ICD-10-CM | POA: Insufficient documentation

## 2023-03-07 LAB — BASIC METABOLIC PANEL
Anion gap: 10 (ref 5–15)
BUN: 32 mg/dL — ABNORMAL HIGH (ref 6–20)
CO2: 22 mmol/L (ref 22–32)
Calcium: 9.1 mg/dL (ref 8.9–10.3)
Chloride: 103 mmol/L (ref 98–111)
Creatinine, Ser: 1.42 mg/dL — ABNORMAL HIGH (ref 0.61–1.24)
GFR, Estimated: 57 mL/min — ABNORMAL LOW (ref >60)
Glucose, Bld: 157 mg/dL — ABNORMAL HIGH (ref 70–99)
Potassium: 5 mmol/L (ref 3.5–5.1)
Sodium: 135 mmol/L (ref 135–145)

## 2023-03-08 DIAGNOSIS — R262 Difficulty in walking, not elsewhere classified: Secondary | ICD-10-CM | POA: Diagnosis not present

## 2023-03-08 DIAGNOSIS — R2689 Other abnormalities of gait and mobility: Secondary | ICD-10-CM | POA: Diagnosis not present

## 2023-03-08 DIAGNOSIS — M6281 Muscle weakness (generalized): Secondary | ICD-10-CM | POA: Diagnosis not present

## 2023-03-10 DIAGNOSIS — K5909 Other constipation: Secondary | ICD-10-CM | POA: Diagnosis not present

## 2023-03-16 DIAGNOSIS — K5909 Other constipation: Secondary | ICD-10-CM | POA: Diagnosis not present

## 2023-03-16 DIAGNOSIS — R2689 Other abnormalities of gait and mobility: Secondary | ICD-10-CM | POA: Diagnosis not present

## 2023-03-16 DIAGNOSIS — R262 Difficulty in walking, not elsewhere classified: Secondary | ICD-10-CM | POA: Diagnosis not present

## 2023-03-16 DIAGNOSIS — M6281 Muscle weakness (generalized): Secondary | ICD-10-CM | POA: Diagnosis not present

## 2023-03-22 DIAGNOSIS — K5909 Other constipation: Secondary | ICD-10-CM | POA: Diagnosis not present

## 2023-03-23 DIAGNOSIS — H401121 Primary open-angle glaucoma, left eye, mild stage: Secondary | ICD-10-CM | POA: Diagnosis not present

## 2023-03-23 DIAGNOSIS — E113512 Type 2 diabetes mellitus with proliferative diabetic retinopathy with macular edema, left eye: Secondary | ICD-10-CM | POA: Diagnosis not present

## 2023-03-24 DIAGNOSIS — M6281 Muscle weakness (generalized): Secondary | ICD-10-CM | POA: Diagnosis not present

## 2023-03-24 DIAGNOSIS — R2689 Other abnormalities of gait and mobility: Secondary | ICD-10-CM | POA: Diagnosis not present

## 2023-03-24 DIAGNOSIS — R262 Difficulty in walking, not elsewhere classified: Secondary | ICD-10-CM | POA: Diagnosis not present

## 2023-03-28 DIAGNOSIS — D539 Nutritional anemia, unspecified: Secondary | ICD-10-CM | POA: Diagnosis not present

## 2023-03-28 DIAGNOSIS — K5904 Chronic idiopathic constipation: Secondary | ICD-10-CM | POA: Diagnosis not present

## 2023-03-28 DIAGNOSIS — E875 Hyperkalemia: Secondary | ICD-10-CM | POA: Diagnosis not present

## 2023-03-28 DIAGNOSIS — E785 Hyperlipidemia, unspecified: Secondary | ICD-10-CM | POA: Diagnosis not present

## 2023-03-28 DIAGNOSIS — I255 Ischemic cardiomyopathy: Secondary | ICD-10-CM | POA: Diagnosis not present

## 2023-03-28 DIAGNOSIS — E66811 Obesity, class 1: Secondary | ICD-10-CM | POA: Diagnosis not present

## 2023-03-28 DIAGNOSIS — I252 Old myocardial infarction: Secondary | ICD-10-CM | POA: Diagnosis not present

## 2023-03-28 DIAGNOSIS — E1169 Type 2 diabetes mellitus with other specified complication: Secondary | ICD-10-CM | POA: Diagnosis not present

## 2023-03-28 DIAGNOSIS — E538 Deficiency of other specified B group vitamins: Secondary | ICD-10-CM | POA: Diagnosis not present

## 2023-03-28 DIAGNOSIS — Z79899 Other long term (current) drug therapy: Secondary | ICD-10-CM | POA: Diagnosis not present

## 2023-03-28 DIAGNOSIS — R790 Abnormal level of blood mineral: Secondary | ICD-10-CM | POA: Diagnosis not present

## 2023-03-30 ENCOUNTER — Ambulatory Visit: Payer: PPO | Admitting: Gastroenterology

## 2023-04-06 DIAGNOSIS — D539 Nutritional anemia, unspecified: Secondary | ICD-10-CM | POA: Diagnosis not present

## 2023-04-06 DIAGNOSIS — E875 Hyperkalemia: Secondary | ICD-10-CM | POA: Diagnosis not present

## 2023-04-13 ENCOUNTER — Other Ambulatory Visit (HOSPITAL_COMMUNITY): Payer: Self-pay | Admitting: Cardiology

## 2023-04-13 DIAGNOSIS — M6281 Muscle weakness (generalized): Secondary | ICD-10-CM | POA: Diagnosis not present

## 2023-04-13 DIAGNOSIS — R262 Difficulty in walking, not elsewhere classified: Secondary | ICD-10-CM | POA: Diagnosis not present

## 2023-04-13 DIAGNOSIS — R2689 Other abnormalities of gait and mobility: Secondary | ICD-10-CM | POA: Diagnosis not present

## 2023-04-14 DIAGNOSIS — K5909 Other constipation: Secondary | ICD-10-CM | POA: Diagnosis not present

## 2023-04-20 DIAGNOSIS — R2689 Other abnormalities of gait and mobility: Secondary | ICD-10-CM | POA: Diagnosis not present

## 2023-04-20 DIAGNOSIS — M6281 Muscle weakness (generalized): Secondary | ICD-10-CM | POA: Diagnosis not present

## 2023-04-20 DIAGNOSIS — R262 Difficulty in walking, not elsewhere classified: Secondary | ICD-10-CM | POA: Diagnosis not present

## 2023-04-21 DIAGNOSIS — Z0000 Encounter for general adult medical examination without abnormal findings: Secondary | ICD-10-CM | POA: Diagnosis not present

## 2023-04-21 DIAGNOSIS — Z9181 History of falling: Secondary | ICD-10-CM | POA: Diagnosis not present

## 2023-04-21 DIAGNOSIS — Z1331 Encounter for screening for depression: Secondary | ICD-10-CM | POA: Diagnosis not present

## 2023-04-27 DIAGNOSIS — R2689 Other abnormalities of gait and mobility: Secondary | ICD-10-CM | POA: Diagnosis not present

## 2023-04-27 DIAGNOSIS — M6281 Muscle weakness (generalized): Secondary | ICD-10-CM | POA: Diagnosis not present

## 2023-04-27 DIAGNOSIS — R262 Difficulty in walking, not elsewhere classified: Secondary | ICD-10-CM | POA: Diagnosis not present

## 2023-05-04 DIAGNOSIS — R262 Difficulty in walking, not elsewhere classified: Secondary | ICD-10-CM | POA: Diagnosis not present

## 2023-05-04 DIAGNOSIS — M6281 Muscle weakness (generalized): Secondary | ICD-10-CM | POA: Diagnosis not present

## 2023-05-04 DIAGNOSIS — R2689 Other abnormalities of gait and mobility: Secondary | ICD-10-CM | POA: Diagnosis not present

## 2023-05-08 ENCOUNTER — Other Ambulatory Visit (HOSPITAL_COMMUNITY): Payer: Self-pay | Admitting: Cardiology

## 2023-05-11 DIAGNOSIS — R2689 Other abnormalities of gait and mobility: Secondary | ICD-10-CM | POA: Diagnosis not present

## 2023-05-11 DIAGNOSIS — R262 Difficulty in walking, not elsewhere classified: Secondary | ICD-10-CM | POA: Diagnosis not present

## 2023-05-11 DIAGNOSIS — M6281 Muscle weakness (generalized): Secondary | ICD-10-CM | POA: Diagnosis not present

## 2023-05-11 DIAGNOSIS — K5909 Other constipation: Secondary | ICD-10-CM | POA: Diagnosis not present

## 2023-05-16 ENCOUNTER — Encounter (HOSPITAL_COMMUNITY): Payer: Self-pay | Admitting: Cardiology

## 2023-05-16 ENCOUNTER — Ambulatory Visit (HOSPITAL_COMMUNITY)
Admission: RE | Admit: 2023-05-16 | Discharge: 2023-05-16 | Disposition: A | Discharge status: Home / Self Care | Payer: PPO | Source: Ambulatory Visit | Attending: Cardiology | Admitting: Cardiology

## 2023-05-16 ENCOUNTER — Telehealth (HOSPITAL_COMMUNITY): Payer: Self-pay | Admitting: Pharmacy Technician

## 2023-05-16 VITALS — BP 124/70 | HR 86 | Wt 214.0 lb

## 2023-05-16 DIAGNOSIS — K5900 Constipation, unspecified: Secondary | ICD-10-CM | POA: Insufficient documentation

## 2023-05-16 DIAGNOSIS — Z7984 Long term (current) use of oral hypoglycemic drugs: Secondary | ICD-10-CM | POA: Insufficient documentation

## 2023-05-16 DIAGNOSIS — I255 Ischemic cardiomyopathy: Secondary | ICD-10-CM | POA: Insufficient documentation

## 2023-05-16 DIAGNOSIS — I2510 Atherosclerotic heart disease of native coronary artery without angina pectoris: Secondary | ICD-10-CM | POA: Insufficient documentation

## 2023-05-16 DIAGNOSIS — Z955 Presence of coronary angioplasty implant and graft: Secondary | ICD-10-CM | POA: Diagnosis not present

## 2023-05-16 DIAGNOSIS — I5022 Chronic systolic (congestive) heart failure: Secondary | ICD-10-CM | POA: Insufficient documentation

## 2023-05-16 DIAGNOSIS — Z79899 Other long term (current) drug therapy: Secondary | ICD-10-CM | POA: Insufficient documentation

## 2023-05-16 DIAGNOSIS — I110 Hypertensive heart disease with heart failure: Secondary | ICD-10-CM | POA: Insufficient documentation

## 2023-05-16 DIAGNOSIS — Z7902 Long term (current) use of antithrombotics/antiplatelets: Secondary | ICD-10-CM | POA: Diagnosis not present

## 2023-05-16 DIAGNOSIS — E785 Hyperlipidemia, unspecified: Secondary | ICD-10-CM | POA: Insufficient documentation

## 2023-05-16 DIAGNOSIS — G8929 Other chronic pain: Secondary | ICD-10-CM | POA: Insufficient documentation

## 2023-05-16 DIAGNOSIS — I252 Old myocardial infarction: Secondary | ICD-10-CM | POA: Diagnosis not present

## 2023-05-16 DIAGNOSIS — E11319 Type 2 diabetes mellitus with unspecified diabetic retinopathy without macular edema: Secondary | ICD-10-CM | POA: Insufficient documentation

## 2023-05-16 DIAGNOSIS — E782 Mixed hyperlipidemia: Secondary | ICD-10-CM | POA: Diagnosis not present

## 2023-05-16 LAB — LIPID PANEL
Cholesterol: 135 mg/dL (ref 0–200)
HDL: 29 mg/dL — ABNORMAL LOW (ref >40)
LDL Cholesterol: 58 mg/dL (ref 0–99)
Total CHOL/HDL Ratio: 4.7 {ratio}
Triglycerides: 241 mg/dL — ABNORMAL HIGH (ref <150)
VLDL: 48 mg/dL — ABNORMAL HIGH (ref 0–40)

## 2023-05-16 LAB — BASIC METABOLIC PANEL
Anion gap: 8 (ref 5–15)
BUN: 35 mg/dL — ABNORMAL HIGH (ref 6–20)
CO2: 24 mmol/L (ref 22–32)
Calcium: 9.7 mg/dL (ref 8.9–10.3)
Chloride: 103 mmol/L (ref 98–111)
Creatinine, Ser: 1.32 mg/dL — ABNORMAL HIGH (ref 0.61–1.24)
GFR, Estimated: >60 mL/min (ref >60)
Glucose, Bld: 224 mg/dL — ABNORMAL HIGH (ref 70–99)
Potassium: 4.6 mmol/L (ref 3.5–5.1)
Sodium: 135 mmol/L (ref 135–145)

## 2023-05-16 LAB — BRAIN NATRIURETIC PEPTIDE: B Natriuretic Peptide: 28.4 pg/mL (ref 0.0–100.0)

## 2023-05-16 MED ORDER — ENTRESTO 97-103 MG PO TABS: 1.0000 | ORAL_TABLET | Freq: Two times a day (BID) | ORAL | 6 refills | Status: DC

## 2023-05-16 NOTE — Telephone Encounter (Signed)
Advanced Heart Failure Patient Advocate Encounter  The patient was approved for a [employer] grant that will help cover the cost of Entresto, Coreg and Spironolactone. Total amount awarded, $10,000. Eligibility, 04/16/23 - 04/14/24.  ID [id number]  BIN [id number]  PCN [id number]  Group [id number]  Provided copy while in clinic.  Archer Asa, CPhT

## 2023-05-16 NOTE — Patient Instructions (Signed)
Medication Changes:  Increase Entresto to 97/103 mg Twice daily   Lab Work:  Labs done today, your results will be available in MyChart, we will contact you for abnormal readings.  Your physician recommends that you return for lab work in: 1-2 weeks  Testing/Procedures:  none  Referrals:  none  Special Instructions // Education:  Do the following things EVERYDAY: Weigh yourself in the morning before breakfast. Write it down and keep it in a log. Take your medicines as prescribed Eat low salt foods--Limit salt (sodium) to 2000 mg per day.  Stay as active as you can everyday Limit all fluids for the day to less than 2 liters  Hyperkalemia Hyperkalemia is when you have too much of a mineral called potassium in your blood. If there is too much potassium in your blood, it can affect how your heart works. Potassium is normally removed from your body by your kidneys. What are the causes? Taking in too much potassium. This can happen if: You use salt substitutes. You take potassium supplements. You eat too many foods that are high in potassium if you have kidney disease. Your body not being able to get rid of potassium. This can happen if: Your kidneys are not working properly. You are taking certain medicines. You have a condition called Addison's disease. You have kidney stones. You are on treatment to clean your blood (dialysis) and you skip a treatment. Your cells releasing a high amount of potassium into the blood. This can happen if: You have a muscle injury. You have very bad burns or infections. You have problems with your blood plasma. This can be caused by diabetes that is not well controlled. What increases the risk? Drinking too much alcohol. Using drugs a lot. What are the signs or symptoms? In many cases, there are no symptoms. But, when your potassium level becomes high enough, you may have symptoms such as: An irregular heartbeat. A very slow  heartbeat. Feeling like you may vomit (nauseous). Tiredness. Confusion. Tingling of your skin. Numbness of your hands or feet. Muscle cramps. Muscle weakness. Not being able to move (paralysis). Follow these instructions at home:  Take over-the-counter and prescription medicines only as told by your doctor. Do not take any of the following unless your doctor says it is okay: Supplements. Natural food products. Herbs. Vitamins. If you drink alcohol, limit how much you have as told by your doctor. Do not use illegal drugs. If you need help quitting, ask your doctor. If you have kidney disease, you may need to follow a low-potassium diet. SEE SHEET ATTACHED Keep all follow-up visits. Summary Hyperkalemia is when you have too much potassium in your blood. Take over-the-counter and prescription medicines only as told by your doctor. Limit how much alcohol you have as told by your doctor. Contact a doctor if your heartbeat is not regular. This information is not intended to replace advice given to you by your health care provider. Make sure you discuss any questions you have with your health care provider. Document Revised: 02/05/2021 Document Reviewed: 02/05/2021 Elsevier Patient Education  2024 Elsevier Inc.   Follow-Up in: 3 months  At the [facility], you and your health needs are our priority. We have a designated team specialized in the treatment of Heart Failure. This Care Team includes your primary Heart Failure Specialized Cardiologist (physician), Advanced Practice Providers (APPs- Physician Assistants and Nurse Practitioners), and Pharmacist who all work together to provide you with the care you need,  when you need it.   You may see any of the following providers on your designated Care Team at your next follow up:  Dr. Arvilla Meres Dr. Marca Ancona Dr. Dorthula Nettles Dr. Theresia Bough Tonye Becket, NP Robbie Lis, [place] [facility] [place], [place] Brynda Peon, NP [place] Lee, NP Karle Plumber, PharmD   Please be sure to bring in all your medications bottles to every appointment.   Need to Contact us:  If you have any questions or concerns before your next appointment please send [place] a message through [place] or call our office at [phone].    TO LEAVE A MESSAGE FOR THE NURSE SELECT OPTION 2, PLEASE LEAVE A MESSAGE INCLUDING: YOUR NAME DATE OF BIRTH CALL BACK NUMBER REASON FOR CALL**this is important as we prioritize the call backs  YOU WILL RECEIVE A CALL BACK THE SAME DAY AS LONG AS YOU CALL BEFORE 4:00 PM

## 2023-05-17 DIAGNOSIS — K5904 Chronic idiopathic constipation: Secondary | ICD-10-CM | POA: Diagnosis not present

## 2023-05-17 DIAGNOSIS — Z125 Encounter for screening for malignant neoplasm of prostate: Secondary | ICD-10-CM | POA: Diagnosis not present

## 2023-05-17 DIAGNOSIS — D539 Nutritional anemia, unspecified: Secondary | ICD-10-CM | POA: Diagnosis not present

## 2023-05-17 NOTE — Progress Notes (Signed)
ADVANCED HF CLINIC NOTE  Primary Care: Doreene Eland, MD HF Cardiologist: Dr. Shirlee Latch   HPI: Lawrence [name] is a 58 y.o. Hispanic male with a history of chronic knee pain from crush injury, DM, HTN, hyperlipidemia, CAD, ischemic cardiomyopathy.    Admitted 02/12/2021 with chest pain in the setting of STEMI. Had LHC and underwent successful PTCA/DES x 1 mid LAD and successful PTCA/DES x 1 obtuse marginal.  Echo in 9/22 showed EF 30-35%. CMRI with EF 31% and unable to assess viability due to acute MI, no thrombus. Discharged with LifeVest. Discharged on losartan, coreg, jardiance, and spironolactone.     Echo in 12/22 showed EF 30-35%, septal/apical severe hypokinesis, normal RV.  Patient wanted to wait a few more months and repeat echo before committing to ICD.  Repeat echo in 3/23 with EF still 350-35%, septal/apical severe HK, RV normal.  Patient decided he did not want ICD and took off Lifevest.   Echo 3/24 showed EF stable 35-40%.   Jardiance stopped with episode of balanitis.   Today he returns for HF follow up with his wife. His weight is up about 15 lbs, he has not been watching his diet.  He feels good, denies exertional dyspnea.  No chest pain.  No orthopnea/PND.  No lightheadedness or palpitations.  Main complaint is constipation.   ECG (personally reviewed): NSR, nonspecific T wave flattening  Labs (10/22): K 4.6, creatinine 0.99 Labs (11/22): LDL 76, TGs 186 Labs (12/22): BNP 85, K 4.6, creatinine 1.22 Labs (3/23): K 4.4, creatinine 0.93, hgb 12.3 Labs (5/23): K 5.1, creatinine 1.02, LDL 92, HDL 38, TGs 150 Labs (8/23): K 4.7, creatinine 1.34 Labs (2/24): K 4.7, creatinine 1.11, LDL 48 Labs (9/24): LDL 157 Labs (10/24): K 5.3, creatinine 1.03, LFTs normal   SH: Lives with his wife and child. Disabled from previous knee injury. He does not drink alcohol. He does not smoke.   PMH: 1. Type 2 diabetes 2. HTN 3. Hyperlipidemia 4. CAD: Anterior MI in 9/22 with DES to mLAD and  DES to OM2.  5. Chronic systolic CHF: Ischemic cardiomyopathy.   - Echo (9/22): EF 30-35%, normal RV.  - Cardiac MRI (9/22): No thrombus. LGE consistent with myocardial infarction in portion of the mid anterior/anteroseptal Unable to assess for viability in setting of acute MI (LGE overestimates final infarct size in setting of acute MI). EF 31%. Akinesis of mid anterior/anteroseptal walls, apical anterior/septal/inferior walls, and apex. RV normal.  - Echo (12/22): EF 30-35%, septal/apical severe hypokinesis, normal RV. - Echo (3/24): EF 35-40%, RV normal 6. Diabetic retinopathy   Review of Systems: All systems reviewed and negative except as per HPI.   Current Outpatient Medications  Medication Sig Dispense Refill   albuterol (VENTOLIN HFA) 108 (90 Base) MCG/ACT inhaler as needed.     blood glucose meter kit and supplies KIT Dispense based on patient and insurance preference. Use up to four times daily as directed. (FOR ICD-9 250.00, 250.01). 1 each 0   carvedilol (COREG) 12.5 MG tablet TAKE 1 & 1/2 (ONE & ONE-HALF) TABLETS BY MOUTH TWICE DAILY WITH MEALS 180 tablet 3   clopidogrel (PLAVIX) 75 MG tablet Take 1 tablet (75 mg total) by mouth daily. 90 tablet 3   ezetimibe (ZETIA) 10 MG tablet Take 1 tablet (10 mg total) by mouth daily. 90 tablet 3   FREESTYLE PRECISION NEO TEST test strip USE 1 STRIP TO CHECK GLUCOSE ONCE DAILY     furosemide (LASIX) 40 MG tablet Take  1 tablet (40 mg total) by mouth daily. 90 tablet 3   metFORMIN (GLUCOPHAGE-XR) 500 MG 24 hr tablet Take 1,000 mg by mouth 2 (two) times daily.     nitroGLYCERIN (NITROSTAT) 0.4 MG SL tablet Place 1 tablet (0.4 mg total) under the tongue every 5 (five) minutes as needed for chest pain. 25 tablet 2   rosuvastatin (CRESTOR) 40 MG tablet Take 1 tablet (40 mg total) by mouth daily. 90 tablet 1   sacubitril-valsartan (ENTRESTO) 97-103 MG Take 1 tablet by mouth 2 (two) times daily. 60 tablet 6   spironolactone (ALDACTONE) 25 MG tablet  Take 0.5 tablets (12.5 mg total) by mouth daily. 45 tablet 3   No current facility-administered medications for this encounter.   Allergies  Allergen Reactions   Spironolactone     Hyperkalemia   Family History  Problem Relation Age of Onset   Cancer Mother    Cancer Father    BP 124/70   Pulse 86   Wt 97.1 kg (214 lb)   SpO2 99%   BMI 32.54 kg/m   Wt Readings from Last 3 Encounters:  05/16/23 97.1 kg (214 lb)  02/21/23 90.3 kg (199 lb)  01/03/23 90.7 kg (200 lb)   PHYSICAL EXAM: General: NAD Neck: No JVD, no thyromegaly or thyroid nodule.  Lungs: Clear to auscultation bilaterally with normal respiratory effort. CV: Nondisplaced PMI.  Heart regular S1/S2, no S3/S4, no murmur.  No peripheral edema.  No carotid bruit.  Normal pedal pulses.  Abdomen: Soft, nontender, no hepatosplenomegaly, no distention.  Skin: Intact without lesions or rashes.  Neurologic: Alert and oriented x 3.  Psych: Normal affect. Extremities: No clubbing or cyanosis.  HEENT: Normal.   ASSESSMENT & PLAN: 1. Chronic systolic CHF:  Ischemic cardiomyopathy.  Echo in 9/22 with EF 30-35%.  Repeat echo in 12/22 showed EF still in the 30-35% range with normal RV.  Repeat echo in 3/23 with EF still 30-35%.  He decided against ICD placement after extensive discussion and removed LifeVest.  Echo (3/24) showed EF 35-40%.  He is not volume overloaded on exam though weight is up.  Suspect this is caloric.  NYHA class I-II.  - Off Jardiance with balanitis.  - Continue Lasix 40 mg daily.  - Increase Entresto to 97/103 bid, BMET/BNP today and BMET in 10 days. Follow low K diet.  - Continue spironolactone 12.5 mg daily (hyperkalemic on higher dose).   - Continue Coreg 18.75 mg bid.  2. CAD: Anterior STEMI in 9/22 with severe mLAD stenosis treated with PCI/DES x1, and severe OM stenosis treated with PCI/DES x1, non dominant RCA with mild non-obstructive disease.  No chest pain.   - Continue Plavix 75 mg daily for  long-term.  - Continue Crestor 40 mg daily and Zetia 10 mg daily. Recheck lipids today, high LDL in 9/24 may have been due to being off statin. If LDL is high, will need referral to lipid clinic for Repatha.  3. HTN: BP controlled.  4. Hyperlipidemia: Continue Crestor and Zetia. - Recheck lipids today, high LDL in 9/24 may have been due to being off statin. If LDL is high, will need referral to lipid clinic for Repatha.  5. DM II: Per PCP.  Follow up in 3 months with APP  Lawrence [name] 05/17/2023

## 2023-05-18 ENCOUNTER — Other Ambulatory Visit (HOSPITAL_COMMUNITY): Payer: Self-pay | Admitting: Cardiology

## 2023-05-18 DIAGNOSIS — R262 Difficulty in walking, not elsewhere classified: Secondary | ICD-10-CM | POA: Diagnosis not present

## 2023-05-18 DIAGNOSIS — M6281 Muscle weakness (generalized): Secondary | ICD-10-CM | POA: Diagnosis not present

## 2023-05-18 DIAGNOSIS — R2689 Other abnormalities of gait and mobility: Secondary | ICD-10-CM | POA: Diagnosis not present

## 2023-05-23 ENCOUNTER — Other Ambulatory Visit: Payer: Self-pay

## 2023-05-26 ENCOUNTER — Ambulatory Visit (HOSPITAL_COMMUNITY)
Admission: RE | Admit: 2023-05-26 | Discharge: 2023-05-26 | Disposition: A | Discharge status: Home / Self Care | Payer: PPO | Source: Ambulatory Visit | Attending: Cardiology | Admitting: Cardiology

## 2023-05-26 DIAGNOSIS — I5022 Chronic systolic (congestive) heart failure: Secondary | ICD-10-CM | POA: Diagnosis not present

## 2023-05-26 LAB — BASIC METABOLIC PANEL
Anion gap: 7 (ref 5–15)
BUN: 29 mg/dL — ABNORMAL HIGH (ref 6–20)
CO2: 23 mmol/L (ref 22–32)
Calcium: 9.1 mg/dL (ref 8.9–10.3)
Chloride: 104 mmol/L (ref 98–111)
Creatinine, Ser: 1.11 mg/dL (ref 0.61–1.24)
GFR, Estimated: >60 mL/min (ref >60)
Glucose, Bld: 241 mg/dL — ABNORMAL HIGH (ref 70–99)
Potassium: 4.2 mmol/L (ref 3.5–5.1)
Sodium: 134 mmol/L — ABNORMAL LOW (ref 135–145)

## 2023-06-02 ENCOUNTER — Other Ambulatory Visit: Payer: PPO

## 2023-06-02 ENCOUNTER — Encounter: Payer: Self-pay | Admitting: Gastroenterology

## 2023-06-02 ENCOUNTER — Ambulatory Visit: Payer: PPO | Admitting: Gastroenterology

## 2023-06-02 VITALS — BP 126/84 | HR 87 | Ht 68.0 in | Wt 213.4 lb

## 2023-06-02 DIAGNOSIS — K581 Irritable bowel syndrome with constipation: Secondary | ICD-10-CM

## 2023-06-02 DIAGNOSIS — R140 Abdominal distension (gaseous): Secondary | ICD-10-CM | POA: Diagnosis not present

## 2023-06-02 DIAGNOSIS — D649 Anemia, unspecified: Secondary | ICD-10-CM

## 2023-06-02 LAB — IBC + FERRITIN
Ferritin: 101.1 ng/mL (ref 22.0–322.0)
Iron: 92 ug/dL (ref 42–165)
Saturation Ratios: 28.6 % (ref 20.0–50.0)
TIBC: 322 ug/dL (ref 250.0–450.0)
Transferrin: 230 mg/dL (ref 212.0–360.0)

## 2023-06-02 NOTE — Patient Instructions (Addendum)
We have sent the following medications with you today:   Trulance 3mg   - take one pill daily.    Please start dulcolax as needed.  Your provider has requested that you go to the basement level for lab work before leaving today. Press "B" on the elevator. The lab is located at the first door on the left as you exit the elevator.  We have gotten you scheduled for a follow up with Dr Adela Lank on March 19th, 2025 at 10:10am     _______________________________________________________  If your blood pressure at your visit was 140/90 or greater, please contact your primary care physician to follow up on this.  _______________________________________________________  If you are age 58 or older, your body mass index should be between 23-30. Your Body mass index is 32.44 kg/m. If this is out of the aforementioned range listed, please consider follow up with your Primary Care Provider.  If you are age 42 or younger, your body mass index should be between 19-25. Your Body mass index is 32.44 kg/m. If this is out of the aformentioned range listed, please consider follow up with your Primary Care Provider.   ________________________________________________________  The [facility] providers would like to encourage you to use [facility] to communicate with providers for non-urgent requests or questions.  Due to long hold times on the telephone, sending your provider a message by [facility] may be a faster and more efficient way to get a response.  Please allow 48 business hours for a response.  Please remember that this is for non-urgent requests.  _______________________________________________________ It was a pleasure to see you today!  Thank you for trusting me with your gastrointestinal care!

## 2023-06-02 NOTE — Progress Notes (Signed)
Chief Complaint: Constipation Primary GI doctor: Dr. Adela Lank  HPI: Patient is a 57 year old male patient with past medical history of diabetes, hypertension,anemia, hyperlipidemia,CAD,ischemic cardiomyopathy, and chronic constipation who was referred to me on 01/04/2023 for recent hospitalization for worsening constipation.  On 01/04/2023 patient was seen at the ED for worsening constipation symptoms. X-ray was ordered showed stool primarily in the descending and sigmoid colon.  Patient was given fleet enema with some relief.Hgb 12.4, Potassium 5.2, Creatinine 1.3 (stable), Mg 2.9, Lipase 59.  Patient was counseled on adding MiraLAX and GI referral placed.  Interval History:   Patient presents today with main complaint of worsening constipation. He has had issues with constipation for several years following leg surgery requiring narcotics for pain control. He is no longer on narcotics, but states he continues with constipation. He has trailed and failed high fiber diet, Over-the-counter magnesium, Miralax, and Amitiza stating they do not work. He also cut down on carbohydrates and increased fruits and vegetables. He has tried pelvic floor therapy without much improvement. Admits to proper hydration and fluid intake. He is currently taking Linzess 2 capsules po in the morning and Doculax 3 capsules at bedtime every other day. He has one bowel movement daily he reports that is a small amount of watery brown liquid. He still feels full and not completely emptied out. He will have abdominal pain and bloating relieved with having bowel movement. He admits to straining at times. No blood in stool.    He will also have nausea when he feels backed up. Denies GERD and dysphagia. Denies hematemesis.     He reports history of anemia since accident back in 2017 when he had to have urgent leg surgery. At that time he was anemic and they wanted to transfuse him with PRBC's but he refused. Not currently on  any iron supplementation. No overt bleeding. Consumes meat in diet. Denies chest pain, SOB, or fatigue.  Past Medical History:  Diagnosis Date   Chronic systolic heart failure (HCC)    Diabetes mellitus without complication (HCC)    HTN (hypertension)    Hyperlipidemia    STEMI (ST elevation myocardial infarction) [facility])    Past Surgical History:  Procedure Laterality Date   CORONARY STENT INTERVENTION N/A 02/12/2021   Procedure: CORONARY STENT INTERVENTION;  Surgeon: Kathleene Hazel, MD;  Location: [facility] INVASIVE CV LAB;  Service: Cardiovascular;  Laterality: N/A;   CORONARY/GRAFT ACUTE MI REVASCULARIZATION N/A 02/12/2021   Procedure: Coronary/Graft Acute MI Revascularization;  Surgeon: Kathleene Hazel, MD;  Location: [facility] INVASIVE CV LAB;  Service: Cardiovascular;  Laterality: N/A;   EXTERNAL FIXATION LEG Right 02/27/2016   Procedure: EXTERNAL FIXATION LEG;  Surgeon: Beverely Low, MD;  Location: [facility] OR;  Service: Orthopedics;  Laterality: Right;  reduction and external fixation right tibial plateau fracture   EXTERNAL FIXATION REMOVAL Right 03/04/2016   Procedure: REMOVAL EXTERNAL FIXATION LEG;  Surgeon: Myrene Galas, MD;  Location: [facility] OR;  Service: Orthopedics;  Laterality: Right;   HARVEST BONE GRAFT Left 03/04/2016   Procedure: Rimmed IM aspirate;  Surgeon: Myrene Galas, MD;  Location: [facility] OR;  Service: Orthopedics;  Laterality: Left;   LEFT HEART CATH AND CORONARY ANGIOGRAPHY N/A 02/12/2021   Procedure: LEFT HEART CATH AND CORONARY ANGIOGRAPHY;  Surgeon: Kathleene Hazel, MD;  Location: [facility] INVASIVE CV LAB;  Service: Cardiovascular;  Laterality: N/A;   ORIF TIBIA PLATEAU Right 03/04/2016   Procedure: OPEN REDUCTION INTERNAL FIXATION (ORIF) TIBIAL PLATEAU;  Surgeon: Myrene Galas, MD;  Location: [facility] OR;  Service: Orthopedics;  Laterality: Right;    Allergies as of 06/02/2023 - Review Complete 05/16/2023  Allergen Reaction Noted   Spironolactone  05/10/2022   Family History   Problem Relation Age of Onset   Cancer Mother    Cancer Father    Review of Systems:    Constitutional: No weight loss, fever, chills, weakness or fatigue HEENT: Eyes: No change in vision               Ears, Nose, Throat:  No change in hearing or congestion Skin: No rash or itching Cardiovascular: No chest pain, chest pressure or palpitations   Respiratory: No SOB or cough Gastrointestinal: See HPI and otherwise negative Genitourinary: No dysuria or change in urinary frequency Neurological: No headache, dizziness or syncope Musculoskeletal: No new muscle or joint pain Hematologic: No bleeding or bruising Psychiatric: No history of depression or anxiety   Physical Exam:  Vital signs: BP 126/84 (Cuff Size: Normal)   Pulse 87   Ht 5\' 8"  (1.727 m)   Wt 213 lb 6 oz (96.8 kg)   BMI 32.44 kg/m   Constitutional:   Pleasant Hispanic male appears to be in NAD, Well developed, Well nourished, alert and cooperative Neck:  Supple Throat: Oral cavity and pharynx without inflammation, swelling or lesion.  Respiratory: Respirations even and unlabored. Lungs clear to auscultation bilaterally.   No wheezes, crackles, or rhonchi.  Cardiovascular: Normal S1, S2.Regular rate and rhythm. No peripheral edema, cyanosis or pallor.  Gastrointestinal:  Soft, nondistended, nontender. No rebound or guarding. Hypoactive bowel sounds. No appreciable masses or hepatomegaly. Rectal: Normal DRE preformed. No external/internal hemorrhoids. Negative hemoccult card. No blood noted in vault. No stool buildup. Neurologic:  Alert and  oriented x4;  grossly normal neurologically.  Skin:   Dry and intact without significant lesions or rashes. Psychiatric: Oriented to person, place and time. Demonstrates good judgement and reason without abnormal affect or behaviors.  RELEVANT LABS AND IMAGING: CBC     Latest Ref Rng & Units 01/03/2023    5:38 PM 02/24/2021   12:35 PM 02/13/2021    1:58 AM  CBC  WBC 4.0 - 10.5  K/uL 8.8  7.2  10.2   Hemoglobin 13.0 - 17.0 g/dL 33.2  95.1  88.4   Hematocrit 39.0 - 52.0 % 37.9  42.9  32.9   Platelets 150 - 400 K/uL 233  281  163     CMP     Latest Ref Rng & Units 05/26/2023   10:08 AM 05/16/2023    2:24 PM 03/07/2023   11:30 AM  CMP  Glucose 70 - 99 mg/dL 166  063  016   BUN 6 - 20 mg/dL 29  35  32   Creatinine 0.61 - 1.24 mg/dL 0.10  9.32  3.55   Sodium 135 - 145 mmol/L 134  135  135   Potassium 3.5 - 5.1 mmol/L 4.2  4.6  5.0   Chloride 98 - 111 mmol/L 104  103  103   CO2 22 - 32 mmol/L 23  24  22    Calcium 8.9 - 10.3 mg/dL 9.1  9.7  9.1    73/22/0254 labs show: Sodium 134, BUN 29/creatinine 1.11 01/03/2023 labs show magnesium 2.9, lipase 59, sodium 132, potassium 5.2, BUN 19, creatinine 1.3, normal LFTs, WBC 8.8, hemoglobin 12.4, platelets 233  01/04/2023 abdominal x-ray  IMPRESSION: Nonobstructive bowel gas pattern.  08/16/2022 echo - EF stable 35-40%.   11/22/2018 colonoscopy with Dr.  Christella Hartigan, recall 11/2028 Impression:  - The entire examined colon is normal on direct and retroflexion views.  - No polyps or cancers.   Encounter Diagnoses  Name Primary?   Irritable bowel syndrome with constipation Yes   Bloating    Anemia, unspecified type     Assessment: 57 year old Hispanic male patient that presents with worsening chronic constipation not relieved with high-fiber diet, over-the-counter MiraLAX, Linzess 2 capsules, and Amitiza. His symptoms are consistent with Rome IV criteria for IBS. Pain is relieved with defecation. Recent x-ray revealed stool buildup, otherwise normal. No fecal impaction or abd distention on exam. We discussed trying a different pro secretory agent like daily Trulance to see if he has better result. Brendolyn Stockley consider promotility agent like Motegrity or Ibsrela if no improvement.    History of normocytic anemia since 02/2016, no overt bleeding. Negative fecal occult card in office. I will check iron panel. Colon 11/2018 and  unremarkable.  Plan: -Samples of Trulance 3mg  po daily, if ineffective [place] Eppard consider Ibsrela or Motegrity -Ducolax as needed -Increase fluid intake and physical activity as tolerated -Check iron panel r/o iron deficiency  -follow-up with Dr. Adela Lank in 3 months  Lorayne Getchell, [facility] 06/02/2023, 8:57 AM  Cc: Doreene Eland, MD

## 2023-06-03 NOTE — Progress Notes (Signed)
Agree with assessment and plan as outlined. Colonoscopy up to date. Will see how he does with Trulance. If no improvement would try IBSRela samples.

## 2023-06-17 DIAGNOSIS — E113512 Type 2 diabetes mellitus with proliferative diabetic retinopathy with macular edema, left eye: Secondary | ICD-10-CM | POA: Diagnosis not present

## 2023-06-28 DIAGNOSIS — D539 Nutritional anemia, unspecified: Secondary | ICD-10-CM | POA: Diagnosis not present

## 2023-06-28 DIAGNOSIS — K5904 Chronic idiopathic constipation: Secondary | ICD-10-CM | POA: Diagnosis not present

## 2023-06-28 DIAGNOSIS — E785 Hyperlipidemia, unspecified: Secondary | ICD-10-CM | POA: Diagnosis not present

## 2023-06-28 DIAGNOSIS — E1169 Type 2 diabetes mellitus with other specified complication: Secondary | ICD-10-CM | POA: Diagnosis not present

## 2023-06-30 ENCOUNTER — Other Ambulatory Visit (HOSPITAL_COMMUNITY): Payer: Self-pay | Admitting: Cardiology

## 2023-07-04 ENCOUNTER — Other Ambulatory Visit (HOSPITAL_COMMUNITY): Payer: Self-pay

## 2023-07-04 DIAGNOSIS — I5022 Chronic systolic (congestive) heart failure: Secondary | ICD-10-CM

## 2023-07-04 MED ORDER — CARVEDILOL 12.5 MG PO TABS: ORAL_TABLET | ORAL | 3 refills | Status: DC

## 2023-08-05 NOTE — Progress Notes (Signed)
 ADVANCED HF CLINIC NOTE  Primary Care: Doreene Eland, MD HF Cardiologist: Dr. Shirlee Latch   HPI: Mr Rehfeld is a 58 y.o. Hispanic male with a history of chronic knee pain from crush injury, DM, HTN, hyperlipidemia, CAD, ischemic cardiomyopathy.    Admitted 02/12/2021 with chest pain in the setting of STEMI. Had LHC and underwent successful PTCA/DES x 1 mid LAD and successful PTCA/DES x 1 obtuse marginal.  Echo in 9/22 showed EF 30-35%. CMRI with EF 31% and unable to assess viability due to acute MI, no thrombus. Discharged with LifeVest. Discharged on losartan, coreg, jardiance, and spironolactone.     Echo in 12/22 showed EF 30-35%, septal/apical severe hypokinesis, normal RV.  Patient wanted to wait a few more months and repeat echo before committing to ICD.  Repeat echo in 3/23 with EF still 350-35%, septal/apical severe HK, RV normal.  Patient decided he did not want ICD and took off Lifevest.   Echo 3/24 showed EF stable 35-40%.   Jardiance stopped with episode of balanitis.   Today he returns for HF follow up. Overall feeling fine. Main complaint is constipation, he is followed by GI. Walking for 30 minutes daily, no dyspnea.  Walks with a cane due to knee pain. Denies palpitations, abnormal bleeding, CP, dizziness, edema, or PND/Orthopnea. Appetite ok. No fever or chills. Weight at home 207-209 pounds. Taking all medications.   ECG (personally reviewed): none ordered today.  Labs (2/24): K 4.7, creatinine 1.11, LDL 48 Labs (9/24): LDL 157 Labs (10/24): K 5.3, creatinine 1.03, LFTs normal Labs (12/24): K 4.2, creatinine 1.11   SH: Lives with his wife and child. Disabled from previous knee injury. He does not drink alcohol. He does not smoke.   PMH: 1. Type 2 diabetes 2. HTN 3. Hyperlipidemia 4. CAD: Anterior MI in 9/22 with DES to mLAD and DES to OM2.  5. Chronic systolic CHF: Ischemic cardiomyopathy.   - Echo (9/22): EF 30-35%, normal RV.  - Cardiac MRI (9/22): No thrombus. LGE  consistent with myocardial infarction in portion of the mid anterior/anteroseptal Unable to assess for viability in setting of acute MI (LGE overestimates final infarct size in setting of acute MI). EF 31%. Akinesis of mid anterior/anteroseptal walls, apical anterior/septal/inferior walls, and apex. RV normal.  - Echo (12/22): EF 30-35%, septal/apical severe hypokinesis, normal RV. - Echo (3/24): EF 35-40%, RV normal 6. Diabetic retinopathy   Review of Systems: All systems reviewed and negative except as per HPI.   Current Outpatient Medications  Medication Sig Dispense Refill   albuterol (VENTOLIN HFA) 108 (90 Base) MCG/ACT inhaler as needed.     blood glucose meter kit and supplies KIT Dispense based on patient and insurance preference. Use up to four times daily as directed. (FOR ICD-9 250.00, 250.01). 1 each 0   carvedilol (COREG) 12.5 MG tablet Patient takes 1 & 1/2 tablet by mouth twice a day. 180 tablet 3   clopidogrel (PLAVIX) 75 MG tablet Take 1 tablet by mouth once daily 90 tablet 2   ezetimibe (ZETIA) 10 MG tablet Take 1 tablet by mouth once daily 90 tablet 3   FREESTYLE PRECISION NEO TEST test strip USE 1 STRIP TO CHECK GLUCOSE ONCE DAILY     LINZESS 145 MCG CAPS capsule Take 2 capsules by mouth daily.     Magnesium Hydroxide (DULCOLAX PO) Take by mouth as needed. 2 in the morning and 3 in the night     metFORMIN (GLUCOPHAGE-XR) 500 MG 24 hr tablet Take  500 mg by mouth 2 (two) times daily.     nitroGLYCERIN (NITROSTAT) 0.4 MG SL tablet Place 1 tablet (0.4 mg total) under the tongue every 5 (five) minutes as needed for chest pain. 25 tablet 2   rosuvastatin (CRESTOR) 40 MG tablet Take 1 tablet (40 mg total) by mouth daily. 90 tablet 1   sacubitril-valsartan (ENTRESTO) 97-103 MG Take 1 tablet by mouth 2 (two) times daily. 60 tablet 6   spironolactone (ALDACTONE) 25 MG tablet Take 1/2 (one-half) tablet by mouth once daily 45 tablet 3   furosemide (LASIX) 40 MG tablet Take 1 tablet (40  mg total) by mouth daily. 90 tablet 3   No current facility-administered medications for this encounter.   Allergies  Allergen Reactions   Spironolactone     Hyperkalemia   Family History  Problem Relation Age of Onset   Cancer Mother    Cancer Father    BP 126/88   Pulse 86   Ht 5\' 8"  (1.727 m)   Wt 98.7 kg (217 lb 9.6 oz)   SpO2 99%   BMI 33.09 kg/m   Wt Readings from Last 3 Encounters:  08/15/23 98.7 kg (217 lb 9.6 oz)  06/02/23 96.8 kg (213 lb 6 oz)  05/16/23 97.1 kg (214 lb)   PHYSICAL EXAM: General:  NAD. No resp difficulty, walked into clinic with cane HEENT: Normal Neck: Supple. No JVD. Cor: Regular rate & rhythm. No rubs, gallops or murmurs. Lungs: Clear Abdomen: Soft, obese, nontender, nondistended.  Extremities: No cyanosis, clubbing, rash, edema Neuro: Alert & oriented x 3, moves all 4 extremities w/o difficulty. Affect pleasant.  ASSESSMENT & PLAN: 1. Chronic systolic CHF:  Ischemic cardiomyopathy.  Echo in 9/22 with EF 30-35%.  Repeat echo in 12/22 showed EF still in the 30-35% range with normal RV.  Repeat echo in 3/23 with EF still 30-35%.  He decided against ICD placement after extensive discussion and removed LifeVest.  Echo (3/24) showed EF 35-40%.  He is not volume overloaded on exam though weight is up.  Suspect this is caloric.  NYHA class I-II.  - Off Jardiance with balanitis.  - Continue Lasix 40 mg daily.  - Continue Entresto 97/103 bid, BMET/BNP today - Continue spironolactone 12.5 mg daily (hyperkalemic on higher dose).   - Continue Coreg 18.75 mg bid.  - Update echo next visit. 2. CAD: Anterior STEMI in 9/22 with severe mLAD stenosis treated with PCI/DES x1, and severe OM stenosis treated with PCI/DES x1, non dominant RCA with mild non-obstructive disease.  No chest pain.   - Continue Plavix 75 mg daily for long-term.  - Continue Crestor 40 mg daily and Zetia 10 mg daily. LDL 58 on 05/16/23, TGs 241 3. HTN: BP controlled.  4. Hyperlipidemia:  Continue Crestor and Zetia. - Good lipids 12/24. 5. DM II: He is on metformin. Per PCP.  Follow up in 3-4 months with Dr. Shirlee Latch + echo  [facility] FNP-BC 08/15/2023

## 2023-08-09 ENCOUNTER — Other Ambulatory Visit: Payer: Self-pay | Admitting: Cardiology

## 2023-08-15 ENCOUNTER — Ambulatory Visit (HOSPITAL_COMMUNITY)
Admission: RE | Admit: 2023-08-15 | Discharge: 2023-08-15 | Disposition: A | Discharge status: Home / Self Care | Payer: PPO | Source: Ambulatory Visit | Attending: Family Medicine | Admitting: Family Medicine

## 2023-08-15 ENCOUNTER — Encounter (HOSPITAL_COMMUNITY): Payer: Self-pay

## 2023-08-15 VITALS — BP 126/88 | HR 86 | Ht 68.0 in | Wt 217.6 lb

## 2023-08-15 DIAGNOSIS — I2510 Atherosclerotic heart disease of native coronary artery without angina pectoris: Secondary | ICD-10-CM | POA: Diagnosis not present

## 2023-08-15 DIAGNOSIS — Z7984 Long term (current) use of oral hypoglycemic drugs: Secondary | ICD-10-CM | POA: Insufficient documentation

## 2023-08-15 DIAGNOSIS — I252 Old myocardial infarction: Secondary | ICD-10-CM | POA: Insufficient documentation

## 2023-08-15 DIAGNOSIS — M25569 Pain in unspecified knee: Secondary | ICD-10-CM | POA: Diagnosis not present

## 2023-08-15 DIAGNOSIS — I5022 Chronic systolic (congestive) heart failure: Secondary | ICD-10-CM | POA: Insufficient documentation

## 2023-08-15 DIAGNOSIS — I110 Hypertensive heart disease with heart failure: Secondary | ICD-10-CM | POA: Diagnosis not present

## 2023-08-15 DIAGNOSIS — Z79899 Other long term (current) drug therapy: Secondary | ICD-10-CM | POA: Insufficient documentation

## 2023-08-15 DIAGNOSIS — K5900 Constipation, unspecified: Secondary | ICD-10-CM | POA: Insufficient documentation

## 2023-08-15 DIAGNOSIS — E785 Hyperlipidemia, unspecified: Secondary | ICD-10-CM | POA: Diagnosis not present

## 2023-08-15 DIAGNOSIS — G8929 Other chronic pain: Secondary | ICD-10-CM | POA: Insufficient documentation

## 2023-08-15 DIAGNOSIS — E11319 Type 2 diabetes mellitus with unspecified diabetic retinopathy without macular edema: Secondary | ICD-10-CM | POA: Diagnosis not present

## 2023-08-15 DIAGNOSIS — Z7902 Long term (current) use of antithrombotics/antiplatelets: Secondary | ICD-10-CM | POA: Insufficient documentation

## 2023-08-15 DIAGNOSIS — E119 Type 2 diabetes mellitus without complications: Secondary | ICD-10-CM | POA: Diagnosis not present

## 2023-08-15 DIAGNOSIS — I10 Essential (primary) hypertension: Secondary | ICD-10-CM

## 2023-08-15 DIAGNOSIS — Z955 Presence of coronary angioplasty implant and graft: Secondary | ICD-10-CM | POA: Insufficient documentation

## 2023-08-15 DIAGNOSIS — I255 Ischemic cardiomyopathy: Secondary | ICD-10-CM | POA: Insufficient documentation

## 2023-08-15 LAB — BASIC METABOLIC PANEL
Anion gap: 9 (ref 5–15)
BUN: 27 mg/dL — ABNORMAL HIGH (ref 6–20)
CO2: 22 mmol/L (ref 22–32)
Calcium: 9.3 mg/dL (ref 8.9–10.3)
Chloride: 103 mmol/L (ref 98–111)
Creatinine, Ser: 1.01 mg/dL (ref 0.61–1.24)
GFR, Estimated: >60 mL/min (ref >60)
Glucose, Bld: 295 mg/dL — ABNORMAL HIGH (ref 70–99)
Potassium: 4.7 mmol/L (ref 3.5–5.1)
Sodium: 134 mmol/L — ABNORMAL LOW (ref 135–145)

## 2023-08-15 LAB — BRAIN NATRIURETIC PEPTIDE: B Natriuretic Peptide: 45.8 pg/mL (ref 0.0–100.0)

## 2023-08-15 NOTE — Patient Instructions (Addendum)
 Medication Changes:  No Changes In Medications at this time.   Lab Work:  Labs done today, your results will be available in MyChart, we will contact you for abnormal readings.  Follow-Up in: 3-4 months with an ECHO PLEASE CALL OUR OFFICE AROUND MAY TO GET SCHEDULED FOR YOUR APPOINTMENT. PHONE NUMBER IS [phone] OPTION 2   At the [facility], you and your health needs are our priority. We have a designated team specialized in the treatment of Heart Failure. This Care Team includes your primary Heart Failure Specialized Cardiologist (physician), Advanced Practice Providers (APPs- Physician Assistants and Nurse Practitioners), and Pharmacist who all work together to provide you with the care you need, when you need it.   You may see any of the following providers on your designated Care Team at your next follow up:  Dr. Arvilla Meres Dr. Marca Ancona Dr. Dorthula Nettles Dr. Theresia Bough Tonye Becket, NP Robbie Lis, [place] [facility] [place], [place] Brynda Peon, NP [place] Lee, NP Karle Plumber, PharmD   Please be sure to bring in all your medications bottles to every appointment.   Need to Contact us:  If you have any questions or concerns before your next appointment please send [place] a message through [place] or call our office at [phone].    TO LEAVE A MESSAGE FOR THE NURSE SELECT OPTION 2, PLEASE LEAVE A MESSAGE INCLUDING: YOUR NAME DATE OF BIRTH CALL BACK NUMBER REASON FOR CALL**this is important as we prioritize the call backs  YOU WILL RECEIVE A CALL BACK THE SAME DAY AS LONG AS YOU CALL BEFORE 4:00 PM

## 2023-08-24 ENCOUNTER — Ambulatory Visit: Payer: PPO | Admitting: Gastroenterology

## 2023-08-24 ENCOUNTER — Encounter: Payer: Self-pay | Admitting: Gastroenterology

## 2023-08-24 VITALS — BP 110/72 | HR 92 | Ht 68.0 in | Wt 220.0 lb

## 2023-08-24 DIAGNOSIS — K5909 Other constipation: Secondary | ICD-10-CM

## 2023-08-24 MED ORDER — IBSRELA 50 MG PO TABS
50.0000 mg | ORAL_TABLET | Freq: Two times a day (BID) | ORAL | 0 refills | Status: DC
Start: 2023-08-24 — End: 2023-12-27

## 2023-08-24 NOTE — Progress Notes (Signed)
 HPI :  58 year old male here for a follow-up visit for chronic constipation.  He was last seen by [facility] May.   Recall he has a past medical history of diabetes, hypertension,anemia, hyperlipidemia,CAD,ischemic cardiomyopathy, and chronic constipation   He is accompanied by his wife today.  He reports longstanding constipation for many years.  He was seen in the ED back in July of last year for worsening constipation and he had significant stool burden in the left colon, was given enemas with relief.  I reviewed his prior notes, he has been on a variety of regimens for his bowels over the years.  Regarding prescription medicines he is been on Linzess, Amitiza, Trulance, none of which he states have provided any significant relief.  He is on a combination of over-the-counter laxatives to include "gelatin laxatives", "nighttime relief", he does not know the exact names of what he is taking but usually it is a combination of over-the-counter regimen he takes every other day to help produce stool.  He states he is very uncomfortable when he does not use the bathroom.  He has previously been followed by Dr. Christella Hartigan last colonoscopy was in 2020 and was completely normal.  His symptoms predate that colonoscopy. -follow-up with Dr. Adela Lank in 3 months   Past Medical History:  Diagnosis Date   Chronic constipation    Chronic systolic heart failure (HCC)    Diabetes mellitus without complication (HCC)    HTN (hypertension)    Hyperlipidemia    STEMI (ST elevation myocardial infarction) [facility])      Past Surgical History:  Procedure Laterality Date   CORONARY STENT INTERVENTION N/A 02/12/2021   Procedure: CORONARY STENT INTERVENTION;  Surgeon: Kathleene Hazel, MD;  [facility];  Service: Cardiovascular;  Laterality: N/A;   CORONARY/GRAFT ACUTE MI REVASCULARIZATION N/A 02/12/2021   Procedure: Coronary/Graft Acute MI Revascularization;  Surgeon: Kathleene Hazel, MD;   Location: [facility];  Service: Cardiovascular;  Laterality: N/A;   EXTERNAL FIXATION LEG Right 02/27/2016   Procedure: EXTERNAL FIXATION LEG;  Surgeon: Beverely Low, MD;  Location: [facility] OR;  Service: Orthopedics;  Laterality: Right;  reduction and external fixation right tibial plateau fracture   EXTERNAL FIXATION REMOVAL Right 03/04/2016   Procedure: REMOVAL EXTERNAL FIXATION LEG;  Surgeon: Myrene Galas, MD;  Location: [facility] OR;  Service: Orthopedics;  Laterality: Right;   HARVEST BONE GRAFT Left 03/04/2016   Procedure: Rimmed IM aspirate;  Surgeon: Myrene Galas, MD;  Location: [facility] OR;  Service: Orthopedics;  Laterality: Left;   LEFT HEART CATH AND CORONARY ANGIOGRAPHY N/A 02/12/2021   Procedure: LEFT HEART CATH AND CORONARY ANGIOGRAPHY;  Surgeon: Kathleene Hazel, MD;  [facility];  Service: Cardiovascular;  Laterality: N/A;   ORIF TIBIA PLATEAU Right 03/04/2016   Procedure: OPEN REDUCTION INTERNAL FIXATION (ORIF) TIBIAL PLATEAU;  Surgeon: Myrene Galas, MD;  Location: [facility] OR;  Service: Orthopedics;  Laterality: Right;   Family History  Problem Relation Age of Onset   Cancer Mother    Cancer Father    Social History   Tobacco Use   Smoking status: Never   Smokeless tobacco: Never  Vaping Use   Vaping status: Never Used  Substance Use Topics   Alcohol use: Not Currently   Drug use: No   Current Outpatient Medications  Medication Sig Dispense Refill   albuterol (VENTOLIN HFA) 108 (90 Base) MCG/ACT inhaler as needed.     blood glucose meter kit and supplies KIT Dispense  based on patient and insurance preference. Use up to four times daily as directed. (FOR ICD-9 250.00, 250.01). 1 each 0   carvedilol (COREG) 12.5 MG tablet Patient takes 1 & 1/2 tablet by mouth twice a day. 180 tablet 3   clopidogrel (PLAVIX) 75 MG tablet Take 1 tablet by mouth once daily 90 tablet 2   ezetimibe (ZETIA) 10 MG tablet Take 1 tablet by mouth once daily 90 tablet 3   FREESTYLE  PRECISION NEO TEST test strip USE 1 STRIP TO CHECK GLUCOSE ONCE DAILY     Magnesium Hydroxide (DULCOLAX PO) Take by mouth as needed. 2 in the morning and 3 in the night     metFORMIN (GLUCOPHAGE-XR) 500 MG 24 hr tablet Take 500 mg by mouth 2 (two) times daily.     nitroGLYCERIN (NITROSTAT) 0.4 MG SL tablet Place 1 tablet (0.4 mg total) under the tongue every 5 (five) minutes as needed for chest pain. 25 tablet 2   rosuvastatin (CRESTOR) 40 MG tablet Take 1 tablet (40 mg total) by mouth daily. 90 tablet 1   sacubitril-valsartan (ENTRESTO) 97-103 MG Take 1 tablet by mouth 2 (two) times daily. 60 tablet 6   spironolactone (ALDACTONE) 25 MG tablet Take 1/2 (one-half) tablet by mouth once daily 45 tablet 3   Tenapanor HCl (IBSRELA) 50 MG TABS Take 50 mg by mouth 2 (two) times daily before a meal. Lot: [id number], exp:  02-2025 24 tablet 0   furosemide (LASIX) 40 MG tablet Take 1 tablet (40 mg total) by mouth daily. 90 tablet 3   No current facility-administered medications for this visit.   Allergies  Allergen Reactions   Spironolactone     Hyperkalemia     Review of Systems: All systems reviewed and negative except where noted in HPI.   Lab Results  Component Value Date   TSH 1.31 11/22/2018    Lab Results  Component Value Date   NA 134 (L) 08/15/2023   CL 103 08/15/2023   K 4.7 08/15/2023   CO2 22 08/15/2023   BUN 27 (H) 08/15/2023   CREATININE 1.01 08/15/2023   GFRNONAA >60 08/15/2023   CALCIUM 9.3 08/15/2023   PHOS 3.8 03/02/2016   ALBUMIN 4.4 01/03/2023   GLUCOSE 295 (H) 08/15/2023    Lab Results  Component Value Date   ALT 16 01/03/2023   AST 15 01/03/2023   ALKPHOS 76 01/03/2023   BILITOT 0.5 01/03/2023    Lab Results  Component Value Date   WBC 8.8 01/03/2023   HGB 12.4 (L) 01/03/2023   HCT 37.9 (L) 01/03/2023   MCV 90.7 01/03/2023   PLT 233 01/03/2023   Lab Results  Component Value Date   IRON 92 06/02/2023   TIBC 322.0 06/02/2023   FERRITIN 101.1  06/02/2023     Physical Exam: BP 110/72   Pulse 92   Ht 5\' 8"  (1.727 m)   Wt 220 lb (99.8 kg)   BMI 33.45 kg/m  Constitutional: Pleasant,well-developed, male in no acute distress. DRE - no mass lesions, normal tone, normal squeeze, impaired decent - ? dyssynergia Extremities: no edema Neurological: Alert and oriented to person place and time. Psychiatric: Normal mood and affect. Behavior is normal.   ASSESSMENT: 58 y.o. male here for assessment of the following  1. Chronic constipation    Ongoing longstanding chronic constipation which has been incompletely relieved with multiple laxatives.  Currently using a combination of over-the-counter laxatives after he has failed multiple prescription regimens as outlined above.  I performed a DRE today, this is my first time meeting him, I think he does have some component of dyssynergia based on that exam, could explain why he is not responding to well to traditional regimens.  Discussed options with him.  Recommend referral to pelvic floor PT to see if that will help his condition and treat any underlying dyssynergia.  At the same time I would like to try him on Ibsrela to see if that will provide any additional relief as well.  I gave him several days of free samples and if this helps in the can let me know and we will put in a prescription.  I reassured him of his last colonoscopy done in the setting of symptoms which was normal.  He does have a mild anemia but his iron studies are normal.  If no improvement over time despite pelvic floor PT and multiple regimens of laxatives can consider repeat colonoscopy but likely low yield.  He understands and agrees with the plan   PLAN: - referral to pelvic floor PT for  component of pelvic floor dyssynergia - start sample of IBSrela 50mg  BID - call for prescription if this helps - call if no improvement in a few weeks - colonoscopy UTD, reassured him, iron studies okay  Harlin Rain,  MD [facility]

## 2023-08-24 NOTE — Patient Instructions (Signed)
 We have given you samples of the following medication to take: IBSrela: Take twice a day  We are referring you to Pelvic Floor Physical Therapy.  They will contact you directly to schedule an appointment.  It may take a week or more before you hear from them.  Please feel free to contact us if you have not heard from them within 2 weeks and we will follow up on the referral.   Thank you for entrusting me with your care and for choosing [facility], Dr. Ileene Patrick    If your blood pressure at your visit was 140/90 or greater, please contact your primary care physician to follow up on this. ______________________________________________________  If you are age 28 or older, your body mass index should be between 23-30. Your Body mass index is 33.45 kg/m. If this is out of the aforementioned range listed, please consider follow up with your Primary Care Provider.  If you are age 21 or younger, your body mass index should be between 19-25. Your Body mass index is 33.45 kg/m. If this is out of the aformentioned range listed, please consider follow up with your Primary Care Provider.  ________________________________________________________  The [place] providers would like to encourage you to use [facility] to communicate with providers for non-urgent requests or questions.  Due to long hold times on the telephone, sending your provider a message by [facility] may be a faster and more efficient way to get a response.  Please allow 48 business hours for a response.  Please remember that this is for non-urgent requests.  _______________________________________________________  Due to recent changes in healthcare laws, you may see the results of your imaging and laboratory studies on MyChart before your provider has had a chance to review them.  We understand that in some cases there may be results that are confusing or concerning to you. Not all laboratory results come back in the same time  frame and the provider may be waiting for multiple results in order to interpret others.  Please give [place] 48 hours in order for your provider to thoroughly review all the results before contacting the office for clarification of your results.

## 2023-08-26 DIAGNOSIS — E66811 Obesity, class 1: Secondary | ICD-10-CM | POA: Diagnosis not present

## 2023-08-26 DIAGNOSIS — E1169 Type 2 diabetes mellitus with other specified complication: Secondary | ICD-10-CM | POA: Diagnosis not present

## 2023-08-26 DIAGNOSIS — E785 Hyperlipidemia, unspecified: Secondary | ICD-10-CM | POA: Diagnosis not present

## 2023-08-26 DIAGNOSIS — I255 Ischemic cardiomyopathy: Secondary | ICD-10-CM | POA: Diagnosis not present

## 2023-08-26 DIAGNOSIS — D539 Nutritional anemia, unspecified: Secondary | ICD-10-CM | POA: Diagnosis not present

## 2023-08-26 DIAGNOSIS — E559 Vitamin D deficiency, unspecified: Secondary | ICD-10-CM | POA: Diagnosis not present

## 2023-08-26 DIAGNOSIS — R790 Abnormal level of blood mineral: Secondary | ICD-10-CM | POA: Diagnosis not present

## 2023-08-26 DIAGNOSIS — I10 Essential (primary) hypertension: Secondary | ICD-10-CM | POA: Diagnosis not present

## 2023-08-26 DIAGNOSIS — K5904 Chronic idiopathic constipation: Secondary | ICD-10-CM | POA: Diagnosis not present

## 2023-08-26 DIAGNOSIS — Z6833 Body mass index (BMI) 33.0-33.9, adult: Secondary | ICD-10-CM | POA: Diagnosis not present

## 2023-08-26 DIAGNOSIS — I252 Old myocardial infarction: Secondary | ICD-10-CM | POA: Diagnosis not present

## 2023-08-26 DIAGNOSIS — E662 Morbid (severe) obesity with alveolar hypoventilation: Secondary | ICD-10-CM | POA: Diagnosis not present

## 2023-08-29 ENCOUNTER — Ambulatory Visit: Attending: Gastroenterology | Admitting: Physical Therapy

## 2023-08-29 ENCOUNTER — Other Ambulatory Visit: Payer: Self-pay

## 2023-08-29 ENCOUNTER — Encounter: Payer: Self-pay | Admitting: Physical Therapy

## 2023-08-29 DIAGNOSIS — R252 Cramp and spasm: Secondary | ICD-10-CM | POA: Diagnosis not present

## 2023-08-29 DIAGNOSIS — R278 Other lack of coordination: Secondary | ICD-10-CM | POA: Diagnosis not present

## 2023-08-29 DIAGNOSIS — M6281 Muscle weakness (generalized): Secondary | ICD-10-CM | POA: Diagnosis not present

## 2023-08-29 NOTE — Therapy (Signed)
 OUTPATIENT PHYSICAL THERAPY MALE PELVIC EVALUATION   Patient Name: Lawrence [name] MRN: [id number] DOB:Sep 14, 1965, 58 y.o., male Today's Date: 08/29/2023  END OF SESSION:  PT End of Session - 08/29/23 1234     Visit Number 1    Date for PT Re-Evaluation 11/21/23    Authorization Type healthteam    Authorization - Visit Number 1    Authorization - Number of Visits 10    PT Start Time 1230    PT Stop Time 1310    PT Time Calculation (min) 40 min    Activity Tolerance Patient tolerated treatment well    Behavior During Therapy WFL for tasks assessed/performed             Past Medical History:  Diagnosis Date   Chronic constipation    Chronic systolic heart failure (HCC)    Diabetes mellitus without complication (HCC)    HTN (hypertension)    Hyperlipidemia    STEMI (ST elevation myocardial infarction) (HCC)    Past Surgical History:  Procedure Laterality Date   CORONARY STENT INTERVENTION N/A 02/12/2021   Procedure: CORONARY STENT INTERVENTION;  Surgeon: Kathleene Hazel, MD;  Location: [facility];  Service: Cardiovascular;  Laterality: N/A;   CORONARY/GRAFT ACUTE MI REVASCULARIZATION N/A 02/12/2021   Procedure: Coronary/Graft Acute MI Revascularization;  Surgeon: Kathleene Hazel, MD;  Location: [facility];  Service: Cardiovascular;  Laterality: N/A;   EXTERNAL FIXATION LEG Right 02/27/2016   Procedure: EXTERNAL FIXATION LEG;  Surgeon: Beverely Low, MD;  Location: [facility] OR;  Service: Orthopedics;  Laterality: Right;  reduction and external fixation right tibial plateau fracture   EXTERNAL FIXATION REMOVAL Right 03/04/2016   Procedure: REMOVAL EXTERNAL FIXATION LEG;  Surgeon: Myrene Galas, MD;  Location: [facility] OR;  Service: Orthopedics;  Laterality: Right;   HARVEST BONE GRAFT Left 03/04/2016   Procedure: Rimmed IM aspirate;  Surgeon: Myrene Galas, MD;  Location: [facility] OR;  Service: Orthopedics;  Laterality: Left;   LEFT HEART CATH AND CORONARY ANGIOGRAPHY  N/A 02/12/2021   Procedure: LEFT HEART CATH AND CORONARY ANGIOGRAPHY;  Surgeon: Kathleene Hazel, MD;  [facility];  Service: Cardiovascular;  Laterality: N/A;   ORIF TIBIA PLATEAU Right 03/04/2016   Procedure: OPEN REDUCTION INTERNAL FIXATION (ORIF) TIBIAL PLATEAU;  Surgeon: Myrene Galas, MD;  Location: [facility] OR;  Service: Orthopedics;  Laterality: Right;   Patient Active Problem List   Diagnosis Date Noted   Status post coronary artery stent placement    Hyperlipidemia 02/16/2021   Hypertension 02/16/2021   Ischemic cardiomyopathy 02/16/2021   STEMI (ST elevation myocardial infarction) [facility])    Retained orthopedic hardware 06/12/2018   Unilateral primary osteoarthritis, right knee 09/19/2017   Chronic pain of right knee 09/19/2017   Status post arthroscopy of right knee 09/19/2017   Fall 03/08/2016   Fever 03/05/2016   Diabetes (HCC) 02/28/2016   Tibial plateau fracture 02/27/2016    PCP: Robb Matar B,MD  REFERRING PROVIDER: Benancio Deeds, MD   REFERRING DIAG: K59.09 (ICD-10-CM) - Chronic constipation   THERAPY DIAG:  Other lack of coordination  Cramp and spasm  Muscle weakness (generalized)  Rationale for Evaluation and Treatment: Rehabilitation  ONSET DATE: 2017  SUBJECTIVE:  SUBJECTIVE STATEMENT: Patient was having difficulty with constipation and needs to take a stool softner.  Fluid intake: does not drink a lot of water  PAIN:  Are you having pain? No  PRECAUTIONS: None  RED FLAGS: None   WEIGHT BEARING RESTRICTIONS: No  FALLS:  Has patient fallen in last 6 months? No  OCCUPATION: not working  ACTIVITY LEVEL : walk 1/2 hour every other day  PLOF: Independent  PATIENT GOALS: reduce constipation  PERTINENT HISTORY:  Diabetes,  Hypertension, CAD, Ischemic cardiomyopathy; coronary stent;  Sexual abuse: No  BOWEL MOVEMENT: Pain with bowel movement: No Type of bowel movement:Type (Bristol Stool Scale) Type 7, Frequency has a bowel movement with his mediation and laxatives, and Strain no Fully empty rectum: No Leakage: No Pads: No Fiber supplement/laxative Yes   URINATION: Leakage: none       OBJECTIVE:  Note: Objective measures were completed at Evaluation unless otherwise noted.  DIAGNOSTIC FINDINGS:  none   COGNITION: Overall cognitive status: Within functional limits for tasks assessed     SENSATION: Light touch: Appears intact    GAIT: Assistive device utilized: Single point cane Comments: Patient has a limp on the right due to an injury in the past  POSTURE: No Significant postural limitations     LOWER EXTREMITY ROM:  Passive ROM Right eval Left eval  Hip internal rotation 5 5  Hip external rotation 80 40   (Blank rows = not tested)  PALPATION:   General: no tenderness  Pelvic Alignment: correct  Abdominal: Decreased movement of the lower rib cage. Not able to perform diaphragmatic breathing.                 External Perineal Exam: no tenderness                             Internal Pelvic Floor: increased tightness in the rectum, the puborectalis does not come forward.   Patient confirms identification and approves PT to assess internal pelvic floor and treatment Yes  PELVIC MMT:   MMT eval  Internal Anal Sphincter 0/5  External Anal Sphincter 1/5  Puborectalis 0/5  (Blank rows = not tested)        TONE: Increased tone of pelvic floor.    TODAY'S TREATMENT:                                                                                                                              DATE: 08/29/23  EVAL See below   PATIENT EDUCATION:  08/29/23 Education details: Access Code: [email or web address] Person educated: Patient Education method: Explanation, Demonstration,  Tactile cues, Verbal cues, and Handouts Education comprehension: verbalized understanding, returned demonstration, verbal cues required, tactile cues required, and needs further education  HOME EXERCISE PROGRAM: 08/29/23 Access Code: [email or web address] URL: [email or web address] Date: 08/29/2023 Prepared by: Eulis Foster  Patient Education - Abdominal Massage for Constipation - Abdominal Massage for Constipation  ASSESSMENT:  CLINICAL IMPRESSION: Patient is a 58 y.o. male who was seen today for physical therapy evaluation and treatment for chronic constipation.  Patient has had issues with  constipation since he injured his right leg and still walks with a cane. Patient reports he is only able to have a bowel movement when he takes the medication with 5 different laxatives and stool softeners. His stool is Type 7. He was not able to push the therapist finger out of the rectum. Puborectalis does not move forward and strength is 0/5. Internal anal sphincter is 0/5. External anal sphincter is 1/5. Patient has tightness in the pelvic floor muscles and no tenderness. He is not able to lengthen the pelvic floor muscles. He is not able to perform diaphragmatic breathing and opening of the lower rib cage. Patient will benefit from skilled therapy to improve pelvic floor coordination to be able to push stool out.   OBJECTIVE IMPAIRMENTS: decreased cognition, decreased endurance, decreased strength, increased fascial restrictions, and increased muscle spasms.   ACTIVITY LIMITATIONS: toileting  PARTICIPATION LIMITATIONS: community activity  PERSONAL FACTORS: Time since onset of injury/illness/exacerbation are also affecting patient's functional outcome.   REHAB POTENTIAL: Good  CLINICAL DECISION MAKING: Evolving/moderate complexity  EVALUATION COMPLEXITY: Moderate   GOALS: Goals reviewed with patient? Yes  SHORT TERM GOALS: Target date: 09/26/23  Patient independent with abdominal  massage.  Baseline: Goal status: INITIAL  2.  Patient is able to perform diaphragmatic breathing to relax the pelvic floor.  Baseline:  Goal status: INITIAL  3.  Patient educated on how to push the stool out with breath and relaxation of the pelvic floor.  Baseline:  Goal status: INITIAL     LONG TERM GOALS: Target date: 11/21/23  Patient is independent with advanced HEP.  Baseline:  Goal status: INITIAL  2.  Patient is able to push the therapist finger out of the rectum so he is able to push stool out.  Baseline:  Goal status: INITIAL  3.  Patient is able to have a bowel movement with taking half of the laxatives.  Baseline:  Goal status: INITIAL  4.  Pelvic floor strength is >/= 3/5 with puborectalis coming forward so he is able to push stool out.  Baseline:  Goal status: INITIAL    PLAN:  PT FREQUENCY: 1x/week  PT DURATION: 12 weeks  PLANNED INTERVENTIONS: 97110-Therapeutic exercises, 97530- Therapeutic activity, 97112- Neuromuscular re-education, 97535- Self Care, [id number]- Manual therapy, [id number]- Electrical stimulation (unattended), Patient/Family education, Dry Needling, and Biofeedback  PLAN FOR NEXT SESSION: diaphragmatic breathing, rib mobility, abdominal massage, massage to pelvic floor   Eulis Foster, PT 08/29/23 1:23 PM

## 2023-09-16 ENCOUNTER — Encounter: Payer: Self-pay | Admitting: Physical Therapy

## 2023-09-16 ENCOUNTER — Ambulatory Visit: Attending: Gastroenterology | Admitting: Physical Therapy

## 2023-09-16 DIAGNOSIS — M6281 Muscle weakness (generalized): Secondary | ICD-10-CM | POA: Insufficient documentation

## 2023-09-16 DIAGNOSIS — R278 Other lack of coordination: Secondary | ICD-10-CM | POA: Diagnosis not present

## 2023-09-16 DIAGNOSIS — R252 Cramp and spasm: Secondary | ICD-10-CM | POA: Diagnosis not present

## 2023-09-16 NOTE — Therapy (Signed)
 OUTPATIENT PHYSICAL THERAPY MALE PELVIC TREATMENT   Patient Name: Lawrence [name] MRN: [id number] DOB:1965/10/15, 58 y.o., male Today's Date: 09/16/2023  END OF SESSION:  PT End of Session - 09/16/23 0938     Visit Number 2    Authorization Type healt[employer]    Authorization - Visit Number 2    Authorization - Number of Visits 10    PT Start Time [phone]    PT Stop Time 1015    PT Time Calculation (min) 39 min    Activity Tolerance Patient tolerated treatment well    Behavior During Therapy WFL for tasks assessed/performed             Past Medical History:  Diagnosis Date   Chronic constipation    Chronic systolic heart failure (HCC)    Diabetes mellitus without complication (HCC)    HTN (hypertension)    Hyperlipidemia    STEMI (ST elevation myocardial infarction) (HCC)    Past Surgical History:  Procedure Laterality Date   CORONARY STENT INTERVENTION N/A 02/12/2021   Procedure: CORONARY STENT INTERVENTION;  Surgeon: Kathleene Hazel, MD;  [facility]: [facility];  Service: Cardiovascular;  Laterality: N/A;   CORONARY/GRAFT ACUTE MI REVASCULARIZATION N/A 02/12/2021   Procedure: Coronary/Graft Acute MI Revascularization;  Surgeon: Kathleene Hazel, MD;  Location: [facility];  Service: Cardiovascular;  Laterality: N/A;   EXTERNAL FIXATION LEG Right 02/27/2016   Procedure: EXTERNAL FIXATION LEG;  Surgeon: Beverely Low, MD;  Location: [facility] OR;  Service: Orthopedics;  Laterality: Right;  reduction and external fixation right tibial plateau fracture   EXTERNAL FIXATION REMOVAL Right 03/04/2016   Procedure: REMOVAL EXTERNAL FIXATION LEG;  Surgeon: Myrene Galas, MD;  Location: [facility] OR;  Service: Orthopedics;  Laterality: Right;   HARVEST BONE GRAFT Left 03/04/2016   Procedure: Rimmed IM aspirate;  Surgeon: Myrene Galas, MD;  Location: [facility] OR;  Service: Orthopedics;  Laterality: Left;   LEFT HEART CATH AND CORONARY ANGIOGRAPHY N/A 02/12/2021   Procedure: LEFT HEART CATH  AND CORONARY ANGIOGRAPHY;  Surgeon: Kathleene Hazel, MD;  [facility]: [facility];  Service: Cardiovascular;  Laterality: N/A;   ORIF TIBIA PLATEAU Right 03/04/2016   Procedure: OPEN REDUCTION INTERNAL FIXATION (ORIF) TIBIAL PLATEAU;  Surgeon: Myrene Galas, MD;  Location[facility] OR;  Service: Orthopedics;  Laterality: Right;   Patient Active Problem List   Diagnosis Date Noted   Status post coronary artery stent placement    Hyperlipidemia 02/16/2021   Hypertension 02/16/2021   Ischemic cardiomyopathy 02/16/2021   STEMI (ST elevation myocardial infarction) [facility])    Retained orthopedic hardware 06/12/2018   Unilateral primary osteoarthritis, right knee 09/19/2017   Chronic pain of right knee 09/19/2017   Status post arthroscopy of right knee 09/19/2017   Fall 03/08/2016   Fever 03/05/2016   Diabetes (HCC) 02/28/2016   Tibial plateau fracture 02/27/2016    PCP: Robb Matar B,MD  REFERRING PROVIDER: Benancio Deeds, MD   REFERRING DIAG: K59.09 (ICD-10-CM) - Chronic constipation   THERAPY DIAG:  Other lack of coordination  Cramp and spasm  Muscle weakness (generalized)  Rationale for Evaluation and Treatment: Rehabilitation  ONSET DATE: 2017  SUBJECTIVE:  SUBJECTIVE STATEMENT: I stopped the medications the doctor has given me and trying something else. It is helping me. I found certain things in my diet makes it worse.  Fluid intake: does not drink a lot of water  PAIN:  Are you having pain? No  PRECAUTIONS: None  RED FLAGS: None   WEIGHT BEARING RESTRICTIONS: No  FALLS:  Has patient fallen in last 6 months? No  OCCUPATION: not working  ACTIVITY LEVEL : walk 1/2 hour every other day  PLOF: Independent  PATIENT GOALS: reduce constipation  PERTINENT HISTORY:   Diabetes, Hypertension, CAD, Ischemic cardiomyopathy; coronary stent;  Sexual abuse: No  BOWEL MOVEMENT: Pain with bowel movement: No Type of bowel movement:Type (Bristol Stool Scale) Type 7, Frequency has a bowel movement with his mediation and laxatives, and Strain no Fully empty rectum: No Leakage: No Pads: No Fiber supplement/laxative Yes   URINATION: Leakage: none       OBJECTIVE:  Note: Objective measures were completed at Evaluation unless otherwise noted.  DIAGNOSTIC FINDINGS:  none   COGNITION: Overall cognitive status: Within functional limits for tasks assessed     SENSATION: Light touch: Appears intact    GAIT: Assistive device utilized: Single point cane Comments: Patient has a limp on the right due to an injury in the past  POSTURE: No Significant postural limitations     LOWER EXTREMITY ROM:  Passive ROM Right eval Left eval  Hip internal rotation 5 5  Hip external rotation 80 40   (Blank rows = not tested)  PALPATION:   General: no tenderness  Pelvic Alignment: correct  Abdominal: Decreased movement of the lower rib cage. Not able to perform diaphragmatic breathing.                 External Perineal Exam: no tenderness                             Internal Pelvic Floor: increased tightness in the rectum, the puborectalis does not come forward.   Patient confirms identification and approves PT to assess internal pelvic floor and treatment Yes  PELVIC MMT:   MMT eval  Internal Anal Sphincter 0/5  External Anal Sphincter 1/5  Puborectalis 0/5  (Blank rows = not tested)        TONE: Increased tone of pelvic floor.    TODAY'S TREATMENT:    09/16/23 Manual: Soft tissue mobilization: Circular massage to promote peristalic motion  Tissue rolling of the abdomen and lower rib cage Manual work to the diaphragm Neuromuscular re-education: Down training: Supine diaphragmatic breathing to relax the pelvic floor Sit on ball to  massage the pelvic floor Sitting diaphragmatic breathing to relax the pelvic floor in sitting Therapeutic activities: Functional strengthening activities: Educated patient on how to breath on the commode to relax the pelvic floor, how to purse his lips as he breaths out to generate pressure to push the stool out and keeping knees above the hips  DATE: 08/29/23  EVAL See below   PATIENT EDUCATION:  09/16/23 Education details: Access Code: [email or web address] Person educated: Patient Education method: Explanation, Demonstration, Actor cues, Verbal cues, and Handouts Education comprehension: verbalized understanding, returned demonstration, verbal cues required, tactile cues required, and needs further education  HOME EXERCISE PROGRAM: 09/16/23 Access Code: [email or web address] URL: [email or web address] Date: 09/16/2023 Prepared by: Eulis Foster  Program Notes sit on ball to massage pelvic floorlay on ball and massage abdomenuse suction cup on abdomen  Exercises - Supine Diaphragmatic Breathing  - 1 x daily - 7 x weekly - 1 sets - 10 reps - Seated Diaphragmatic Breathing  - 2 x daily - 7 x weekly - 1 sets - 10 reps - Supine Piriformis Stretch with Leg Straight  - 1 x daily - 7 x weekly - 1 sets - 2 reps - 30 sec hold - Seated Piriformis Stretch with Trunk Bend  - 1 x daily - 7 x weekly - 1 sets - 2 reps - 30 sec hold  Patient Education - Abdominal Massage for Constipation - Abdominal Massage for Constipation  ASSESSMENT:  CLINICAL IMPRESSION: Patient is a 58 y.o. male who was seen today for physical therapy  treatment for chronic constipation.   Patient is taking a new mixture that is helping with his constipation. His abdomen was softer after the manual work. He was able to feel the pelvic floor relax with the diaphragmatic breathing.  Patient will benefit from  skilled therapy to improve pelvic floor coordination to be able to push stool out.   OBJECTIVE IMPAIRMENTS: decreased cognition, decreased endurance, decreased strength, increased fascial restrictions, and increased muscle spasms.   ACTIVITY LIMITATIONS: toileting  PARTICIPATION LIMITATIONS: community activity  PERSONAL FACTORS: Time since onset of injury/illness/exacerbation are also affecting patient's functional outcome.   REHAB POTENTIAL: Good  CLINICAL DECISION MAKING: Evolving/moderate complexity  EVALUATION COMPLEXITY: Moderate   GOALS: Goals reviewed with patient? Yes  SHORT TERM GOALS: Target date: 09/26/23  Patient independent with abdominal massage.  Baseline: Goal status: Met 09/16/23  2.  Patient is able to perform diaphragmatic breathing to relax the pelvic floor.  Baseline:  Goal status: Met 09/16/23  3.  Patient educated on how to push the stool out with breath and relaxation of the pelvic floor.  Baseline:  Goal status: INITIAL     LONG TERM GOALS: Target date: 11/21/23  Patient is independent with advanced HEP.  Baseline:  Goal status: INITIAL  2.  Patient is able to push the therapist finger out of the rectum so he is able to push stool out.  Baseline:  Goal status: INITIAL  3.  Patient is able to have a bowel movement with taking half of the laxatives.  Baseline:  Goal status: INITIAL  4.  Pelvic floor strength is >/= 3/5 with puborectalis coming forward so he is able to push stool out.  Baseline:  Goal status: INITIAL    PLAN:  PT FREQUENCY: 1x/week  PT DURATION: 12 weeks  PLANNED INTERVENTIONS: 97110-Therapeutic exercises, 97530- Therapeutic activity, 97112- Neuromuscular re-education, 97535- Self Care, [id number]- Manual therapy, [id number]- Electrical stimulation (unattended), Patient/Family education, Dry Needling, and Biofeedback  PLAN FOR NEXT SESSION:  abdominal massage, massage to pelvic floor   Eulis Foster, PT 09/16/23 10:13 AM

## 2023-09-16 NOTE — Patient Instructions (Signed)
How To Poop Better:  What are Good Poops? There is no one exact normal, but they should be REGULAR.  This varies from person to person and ranges from up to 3x/day or as little as 3-4/week.  This should stay consistent for you.  They should be formed and ideally one solid mass that doesn't fall apart or dissolve in the water and is brown in color.  Lifestyle Tips:  Fiber: Eat 25-31 grams per day Do not hold it.  If you need to go, GO! Try to go every day around the same time Walk and move more Probiotics for more healthy gut bacteria Water and fluids: half of your healthy body weight in ounces  Toileting Tips:  Posture: knees above hips, back flat, look straight ahead, RELAX Relax all the muscles from your face down to your toes Breathe: slow deep breaths into your belly and pelvic floor is RELAXED Blow: Tighten belly and blow like blowing up a balloon, make "SH" sound, make a vowel sound with a deep voice Do NOT sit more than 10 minutes After you are finished, tighten the muscles to reset pelvic floor back to normal     [facility] [address] [place], [place] [address] Phone # [phone] Fax [phone]

## 2023-09-21 DIAGNOSIS — E113512 Type 2 diabetes mellitus with proliferative diabetic retinopathy with macular edema, left eye: Secondary | ICD-10-CM | POA: Diagnosis not present

## 2023-09-21 DIAGNOSIS — H401121 Primary open-angle glaucoma, left eye, mild stage: Secondary | ICD-10-CM | POA: Diagnosis not present

## 2023-10-12 ENCOUNTER — Other Ambulatory Visit (HOSPITAL_COMMUNITY): Payer: Self-pay

## 2023-10-12 DIAGNOSIS — I5022 Chronic systolic (congestive) heart failure: Secondary | ICD-10-CM

## 2023-10-12 MED ORDER — CARVEDILOL 12.5 MG PO TABS: ORAL_TABLET | ORAL | 3 refills | Status: DC

## 2023-10-21 ENCOUNTER — Ambulatory Visit: Attending: Gastroenterology | Admitting: Physical Therapy

## 2023-10-21 ENCOUNTER — Encounter: Payer: Self-pay | Admitting: Physical Therapy

## 2023-10-21 DIAGNOSIS — M6281 Muscle weakness (generalized): Secondary | ICD-10-CM | POA: Diagnosis not present

## 2023-10-21 DIAGNOSIS — R278 Other lack of coordination: Secondary | ICD-10-CM | POA: Insufficient documentation

## 2023-10-21 DIAGNOSIS — R252 Cramp and spasm: Secondary | ICD-10-CM | POA: Insufficient documentation

## 2023-10-21 NOTE — Therapy (Signed)
 OUTPATIENT PHYSICAL THERAPY MALE PELVIC TREATMENT   Patient Name: Lawrence [name] MRN: [id number] DOB:[birth year], 58 y.o., male Today's Date: 10/21/2023  END OF SESSION:  PT End of Session - 10/21/23 0930     Visit Number 3    Date for PT Re-Evaluation 11/21/23    Authorization Type healthteam    Authorization - Visit Number 3    Authorization - Number of Visits 10    PT Start Time 0930    PT Stop Time 1010    PT Time Calculation (min) 40 min    Activity Tolerance Patient tolerated treatment well    Behavior During Therapy WFL for tasks assessed/performed             Past Medical History:  Diagnosis Date   Chronic constipation    Chronic systolic heart failure (HCC)    Diabetes mellitus without complication (HCC)    HTN (hypertension)    Hyperlipidemia    STEMI (ST elevation myocardial infarction) [facility])    Past Surgical History:  Procedure Laterality Date   CORONARY STENT INTERVENTION N/A 02/12/2021   Procedure: CORONARY STENT INTERVENTION;  Surgeon: Odie Benne, MD;  Location: [facility];  Service: Cardiovascular;  Laterality: N/A;   CORONARY/GRAFT ACUTE MI REVASCULARIZATION N/A 02/12/2021   Procedure: Coronary/Graft Acute MI Revascularization;  Surgeon: Odie Benne, MD;  Location: [facility];  Service: Cardiovascular;  Laterality: N/A;   EXTERNAL FIXATION LEG Right 02/27/2016   Procedure: EXTERNAL FIXATION LEG;  Surgeon: Winston Hawking, MD;  Location: [facility] OR;  Service: Orthopedics;  Laterality: Right;  reduction and external fixation right tibial plateau fracture   EXTERNAL FIXATION REMOVAL Right 03/04/2016   Procedure: REMOVAL EXTERNAL FIXATION LEG;  Surgeon: Hardy Lia, MD;  Location: [facility] OR;  Service: Orthopedics;  Laterality: Right;   HARVEST BONE GRAFT Left 03/04/2016   Procedure: Rimmed IM aspirate;  Surgeon: Hardy Lia, MD;  Location: [facility] OR;  Service: Orthopedics;  Laterality: Left;   LEFT HEART CATH AND CORONARY ANGIOGRAPHY N/A  02/12/2021   Procedure: LEFT HEART CATH AND CORONARY ANGIOGRAPHY;  Surgeon: Odie Benne, MD;  [facility]: [facility];  Service: Cardiovascular;  Laterality: N/A;   ORIF TIBIA PLATEAU Right 03/04/2016   Procedure: OPEN REDUCTION INTERNAL FIXATION (ORIF) TIBIAL PLATEAU;  Surgeon: Hardy Lia, MD;  Location: [facility] OR;  Service: Orthopedics;  Laterality: Right;   Patient Active Problem List   Diagnosis Date Noted   Status post coronary artery stent placement    Hyperlipidemia 02/16/2021   Hypertension 02/16/2021   Ischemic cardiomyopathy 02/16/2021   STEMI (ST elevation myocardial infarction) [facility])    Retained orthopedic hardware 06/12/2018   Unilateral primary osteoarthritis, right knee 09/19/2017   Chronic pain of right knee 09/19/2017   Status post arthroscopy of right knee 09/19/2017   Fall 03/08/2016   Fever 03/05/2016   Diabetes (HCC) 02/28/2016   Tibial plateau fracture 02/27/2016    PCP: Margueritte Shilling B,MD  REFERRING PROVIDER: Ace Holder, MD   REFERRING DIAG: K59.09 (ICD-10-CM) - Chronic constipation   THERAPY DIAG:  Other lack of coordination  Cramp and spasm  Muscle weakness (generalized)  Rationale for Evaluation and Treatment: Rehabilitation  ONSET DATE: 2017  SUBJECTIVE:  SUBJECTIVE STATEMENT: I have been doing the exercises and seeing a little difference. I am having a bowel movement 1 time per day. I have been feeling better. I am feeling satisfaction after a bowel movement. Stool is not as watery and is solid.   Fluid intake: does not drink a lot of water  PAIN:  Are you having pain? No  PRECAUTIONS: None  RED FLAGS: None   WEIGHT BEARING RESTRICTIONS: No  FALLS:  Has patient fallen in last 6 months? No  OCCUPATION: not working  ACTIVITY  LEVEL : walk 1/2 hour every other day  PLOF: Independent  PATIENT GOALS: reduce constipation  PERTINENT HISTORY:  Diabetes, Hypertension, CAD, Ischemic cardiomyopathy; coronary stent;  Sexual abuse: No  BOWEL MOVEMENT: Pain with bowel movement: No Type of bowel movement:Type (Bristol Stool Scale) Type 7, Frequency has a bowel movement with his mediation and laxatives, and Strain no 10/21/23: stool comes out like a smoothie Fully empty rectum: No Leakage: No Pads: No Fiber supplement/laxative Yes   URINATION: Leakage: none       OBJECTIVE:  Note: Objective measures were completed at Evaluation unless otherwise noted.  DIAGNOSTIC FINDINGS:  none   COGNITION: Overall cognitive status: Within functional limits for tasks assessed     SENSATION: Light touch: Appears intact    GAIT: Assistive device utilized: Single point cane Comments: Patient has a limp on the right due to an injury in the past  POSTURE: No Significant postural limitations     LOWER EXTREMITY ROM:  Passive ROM Right eval Left eval  Hip internal rotation 5 5  Hip external rotation 80 40   (Blank rows = not tested)  PALPATION:   General: no tenderness  Pelvic Alignment: correct  Abdominal: Decreased movement of the lower rib cage. Not able to perform diaphragmatic breathing.                 External Perineal Exam: no tenderness                             Internal Pelvic Floor: increased tightness in the rectum, the puborectalis does not come forward.   Patient confirms identification and approves PT to assess internal pelvic floor and treatment Yes  PELVIC MMT:   MMT eval  Internal Anal Sphincter 0/5  External Anal Sphincter 1/5  Puborectalis 0/5  (Blank rows = not tested)        TONE: Increased tone of pelvic floor.    TODAY'S TREATMENT:    10/21/23 Neuromuscular re-education: Pelvic floor contraction training: Using the rusi on the transperineal setting working on  pelvic floor contraction with ultrasound head on the perineal area. Patient is able to contract his pelvic floor but not able to relax. He will contract when bearing down.  Using the RUSI on the diaphragm working on breathing in to bring the diaphragm downward to relax the pelvic floor. He needed vc to breath in deep enough to fill his abdomen and relax the pelvic floor.     09/16/23 Manual: Soft tissue mobilization: Circular massage to promote peristalic motion  Tissue rolling of the abdomen and lower rib cage Manual work to the diaphragm Neuromuscular re-education: Down training: Supine diaphragmatic breathing to relax the pelvic floor Sit on ball to massage the pelvic floor Sitting diaphragmatic breathing to relax the pelvic floor in sitting Therapeutic activities: Functional strengthening activities: Educated patient on how to breath on the commode to relax the pelvic  floor, how to purse his lips as he breaths out to generate pressure to push the stool out and keeping knees above the hips                                                                                                                              DATE: 08/29/23  EVAL See below   PATIENT EDUCATION:  09/16/23 Education details: Access Code: [email or web address] Person educated: Patient Education method: Explanation, Demonstration, Actor cues, Verbal cues, and Handouts Education comprehension: verbalized understanding, returned demonstration, verbal cues required, tactile cues required, and needs further education  HOME EXERCISE PROGRAM: 09/16/23 Access Code: [email or web address] URL: [email or web address] Date: 09/16/2023 Prepared by: Marsha Skeen  Program Notes sit on ball to massage pelvic floorlay on ball and massage abdomenuse suction cup on abdomen  Exercises - Supine Diaphragmatic Breathing  - 1 x daily - 7 x weekly - 1 sets - 10 reps - Seated Diaphragmatic Breathing  - 2 x daily - 7 x weekly - 1 sets - 10 reps -  Supine Piriformis Stretch with Leg Straight  - 1 x daily - 7 x weekly - 1 sets - 2 reps - 30 sec hold - Seated Piriformis Stretch with Trunk Bend  - 1 x daily - 7 x weekly - 1 sets - 2 reps - 30 sec hold  Patient Education - Abdominal Massage for Constipation - Abdominal Massage for Constipation  ASSESSMENT:  CLINICAL IMPRESSION: Patient is a 58 y.o. male who was seen today for physical therapy  treatment for chronic constipation.   Patient is taking a new mixture that is helping with his constipation. Patient is able to have a bowel movement daily. He has difficulty with relaxing his pelvic floor contraction and will contract instead.This makes it difficult to push stool  out. His stool is a thicker consistency.  He was able to bring the diaphragm downward by the end of the treatment.   Patient will benefit from skilled therapy to improve pelvic floor coordination to be able to push stool out.   OBJECTIVE IMPAIRMENTS: decreased cognition, decreased endurance, decreased strength, increased fascial restrictions, and increased muscle spasms.   ACTIVITY LIMITATIONS: toileting  PARTICIPATION LIMITATIONS: community activity  PERSONAL FACTORS: Time since onset of injury/illness/exacerbation are also affecting patient's functional outcome.   REHAB POTENTIAL: Good  CLINICAL DECISION MAKING: Evolving/moderate complexity  EVALUATION COMPLEXITY: Moderate   GOALS: Goals reviewed with patient? Yes  SHORT TERM GOALS: Target date: 09/26/23  Patient independent with abdominal massage.  Baseline: Goal status: Met 09/16/23  2.  Patient is able to perform diaphragmatic breathing to relax the pelvic floor.  Baseline:  Goal status: Met 09/16/23  3.  Patient educated on how to push the stool out with breath and relaxation of the pelvic floor.  Baseline:  Goal status: INITIAL     LONG TERM GOALS: Target date: 11/21/23  Patient is independent with advanced HEP.  Baseline:  Goal status:  INITIAL  2.  Patient is able to push the therapist finger out of the rectum so he is able to push stool out.  Baseline:  Goal status: INITIAL  3.  Patient is able to have a bowel movement with taking half of the laxatives.  Baseline:  Goal status: INITIAL  4.  Pelvic floor strength is >/= 3/5 with puborectalis coming forward so he is able to push stool out.  Baseline:  Goal status: INITIAL    PLAN:  PT FREQUENCY: 1x/week  PT DURATION: 12 weeks  PLANNED INTERVENTIONS: 97110-Therapeutic exercises, 97530- Therapeutic activity, 97112- Neuromuscular re-education, 97535- Self Care, 54098- Manual therapy, G0283- Electrical stimulation (unattended), Patient/Family education, Dry Needling, and Biofeedback  PLAN FOR NEXT SESSION:  abdominal massage, massage to pelvic floor; rusi to work on pelvic floor drop   Marsha Skeen, PT 10/21/23 11:24 AM

## 2023-10-24 ENCOUNTER — Ambulatory Visit: Admitting: Physical Therapy

## 2023-10-24 ENCOUNTER — Encounter: Payer: Self-pay | Admitting: Physical Therapy

## 2023-10-24 DIAGNOSIS — R278 Other lack of coordination: Secondary | ICD-10-CM

## 2023-10-24 DIAGNOSIS — M6281 Muscle weakness (generalized): Secondary | ICD-10-CM

## 2023-10-24 DIAGNOSIS — R252 Cramp and spasm: Secondary | ICD-10-CM

## 2023-10-24 NOTE — Therapy (Signed)
 OUTPATIENT PHYSICAL THERAPY MALE PELVIC TREATMENT   Patient Name: Lawrence [name] MRN: [id number] DOB:[birth year], 58 y.o., male Today's Date: 10/24/2023  END OF SESSION:  PT End of Session - 10/24/23 1017     Visit Number 4    Date for PT Re-Evaluation 11/21/23    Authorization Type [employer]    Authorization - Visit Number 4    Authorization - Number of Visits 10    PT Start Time 1015    PT Stop Time 1055    PT Time Calculation (min) 40 min    Activity Tolerance Patient tolerated treatment well    Behavior During Therapy WFL for tasks assessed/performed             Past Medical History:  Diagnosis Date   Chronic constipation    Chronic systolic heart failure (HCC)    Diabetes mellitus without complication (HCC)    HTN (hypertension)    Hyperlipidemia    STEMI (ST elevation myocardial infarction) (HCC)    Past Surgical History:  Procedure Laterality Date   CORONARY STENT INTERVENTION N/A 02/12/2021   Procedure: CORONARY STENT INTERVENTION;  Surgeon: Odie Benne, MD;  Location: [facility];  Service: Cardiovascular;  Laterality: N/A;   CORONARY/GRAFT ACUTE MI REVASCULARIZATION N/A 02/12/2021   Procedure: Coronary/Graft Acute MI Revascularization;  Surgeon: Odie Benne, MD;  Location: [facility];  Service: Cardiovascular;  Laterality: N/A;   EXTERNAL FIXATION LEG Right 02/27/2016   Procedure: EXTERNAL FIXATION LEG;  Surgeon: Winston Hawking, MD;  Location: [facility] OR;  Service: Orthopedics;  Laterality: Right;  reduction and external fixation right tibial plateau fracture   EXTERNAL FIXATION REMOVAL Right 03/04/2016   Procedure: REMOVAL EXTERNAL FIXATION LEG;  Surgeon: Hardy Lia, MD;  Location: [facility] OR;  Service: Orthopedics;  Laterality: Right;   HARVEST BONE GRAFT Left 03/04/2016   Procedure: Rimmed IM aspirate;  Surgeon: Hardy Lia, MD;  Location: [facility] OR;  Service: Orthopedics;  Laterality: Left;   LEFT HEART CATH AND CORONARY ANGIOGRAPHY N/A  02/12/2021   Procedure: LEFT HEART CATH AND CORONARY ANGIOGRAPHY;  Surgeon: Odie Benne, MD;  Location: [facility];  Service: Cardiovascular;  Laterality: N/A;   ORIF TIBIA PLATEAU Right 03/04/2016   Procedure: OPEN REDUCTION INTERNAL FIXATION (ORIF) TIBIAL PLATEAU;  Surgeon: Hardy Lia, MD;  Location: [facility] OR;  Service: Orthopedics;  Laterality: Right;   Patient Active Problem List   Diagnosis Date Noted   Status post coronary artery stent placement    Hyperlipidemia 02/16/2021   Hypertension 02/16/2021   Ischemic cardiomyopathy 02/16/2021   STEMI (ST elevation myocardial infarction) [facility])    Retained orthopedic hardware 06/12/2018   Unilateral primary osteoarthritis, right knee 09/19/2017   Chronic pain of right knee 09/19/2017   Status post arthroscopy of right knee 09/19/2017   Fall 03/08/2016   Fever 03/05/2016   Diabetes (HCC) 02/28/2016   Tibial plateau fracture 02/27/2016    PCP: Margueritte Shilling B,MD  REFERRING PROVIDER: Ace Holder, MD   REFERRING DIAG: K59.09 (ICD-10-CM) - Chronic constipation   THERAPY DIAG:  Other lack of coordination  Cramp and spasm  Muscle weakness (generalized)  Rationale for Evaluation and Treatment: Rehabilitation  ONSET DATE: 2017  SUBJECTIVE:  SUBJECTIVE STATEMENT: I am doing the exercises. I am pushing the stool out better. Doing the abdominal massage daily.  Fluid intake: does not drink a lot of water  PAIN:  Are you having pain? No  PRECAUTIONS: None  RED FLAGS: None   WEIGHT BEARING RESTRICTIONS: No  FALLS:  Has patient fallen in last 6 months? No  OCCUPATION: not working  ACTIVITY LEVEL : walk 1/2 hour every other day  PLOF: Independent  PATIENT GOALS: reduce constipation  PERTINENT HISTORY:   Diabetes, Hypertension, CAD, Ischemic cardiomyopathy; coronary stent;  Sexual abuse: No  BOWEL MOVEMENT: Pain with bowel movement: No Type of bowel movement:Type (Bristol Stool Scale) Type 7, Frequency has a bowel movement with his mediation and laxatives, and Strain no 10/21/23: stool comes out like a smoothie Fully empty rectum: No Leakage: No Pads: No Fiber supplement/laxative Yes   URINATION: Leakage: none       OBJECTIVE:  Note: Objective measures were completed at Evaluation unless otherwise noted.  DIAGNOSTIC FINDINGS:  none   COGNITION: Overall cognitive status: Within functional limits for tasks assessed     SENSATION: Light touch: Appears intact    GAIT: Assistive device utilized: Single point cane Comments: Patient has a limp on the right due to an injury in the past  POSTURE: No Significant postural limitations     LOWER EXTREMITY ROM:  Passive ROM Right eval Left eval  Hip internal rotation 5 5  Hip external rotation 80 40   (Blank rows = not tested)  PALPATION:   General: no tenderness  Pelvic Alignment: correct  Abdominal: Decreased movement of the lower rib cage. Not able to perform diaphragmatic breathing.                 External Perineal Exam: no tenderness                             Internal Pelvic Floor: increased tightness in the rectum, the puborectalis does not come forward.   Patient confirms identification and approves PT to assess internal pelvic floor and treatment Yes  PELVIC MMT:   MMT eval  Internal Anal Sphincter 0/5  External Anal Sphincter 1/5  Puborectalis 0/5  (Blank rows = not tested)        TONE: Increased tone of pelvic floor.    TODAY'S TREATMENT:    10/24/23 Exercises: Strengthening: Bridge with marching 20 x Transverse abdominus contraction 10 x  Supine marching with abdominal contraction 20 x Supine abdominal contraction with alternate shoulder flexion with green band Wall plank with  holding green band and move outward working the abdominals 20 x  Sitting pallof with green band 20 x each way Sitting bilateral shoulder extension with green band 20 x Sitting bilateral shoulder extension with marching using green band 20 x    10/21/23 Neuromuscular re-education: Pelvic floor contraction training: Using the rusi on the transperineal setting working on pelvic floor contraction with ultrasound head on the perineal area. Patient is able to contract his pelvic floor but not able to relax. He will contract when bearing down.  Using the RUSI on the diaphragm working on breathing in to bring the diaphragm downward to relax the pelvic floor. He needed vc to breath in deep enough to fill his abdomen and relax the pelvic floor.     09/16/23 Manual: Soft tissue mobilization: Circular massage to promote peristalic motion  Tissue rolling of the abdomen and lower rib cage Manual  work to the diaphragm Neuromuscular re-education: Down training: Supine diaphragmatic breathing to relax the pelvic floor Sit on ball to massage the pelvic floor Sitting diaphragmatic breathing to relax the pelvic floor in sitting Therapeutic activities: Functional strengthening activities: Educated patient on how to breath on the commode to relax the pelvic floor, how to purse his lips as he breaths out to generate pressure to push the stool out and keeping knees above the hips                                                                                                                              DATE: 08/29/23  EVAL See below   PATIENT EDUCATION:  10/24/23 Education details: Access Code: [email or web address] Person educated: Patient Education method: Programmer, multimedia, Demonstration, Actor cues, Verbal cues, and Handouts Education comprehension: verbalized understanding, returned demonstration, verbal cues required, tactile cues required, and needs further education  HOME EXERCISE PROGRAM: 10/24/23 Access Code:  [email or web address] URL: [email or web address] Date: 10/24/2023 Prepared by: Marsha Skeen  Exercises - Hooklying Sequential Leg March and Lower  - 1 x daily - 7 x weekly - 2 sets - 10 reps - Marching Bridge  - 1 x daily - 7 x weekly - 2 sets - 10 reps - Supine Transversus Abdominis Bracing with Pelvic Floor Contraction  - 1 x daily - 7 x weekly - 2 sets - 10 reps - Seated Diaphragmatic Breathing  - 1 x daily - 7 x weekly - 1 sets - 10 reps - Supine Alternating Shoulder Flexion  - 1 x daily - 7 x weekly - 2 sets - 10 reps - Standing Plank on Wall with Reaches and Resistance  - 1 x daily - 7 x weekly - 2 sets - 10 reps - Seated Shoulder Extension and Scapular Retraction with Resistance  - 1 x daily - 7 x weekly - 2 sets - 10 reps - Standing Anti-Rotation Press with Anchored Resistance  - 1 x daily - 7 x weekly - 2 sets - 10 reps  Patient Education - Abdominal Massage for Constipation - Abdominal Massage for Constipation  ASSESSMENT:  CLINICAL IMPRESSION: Patient is a 58 y.o. male who was seen today for physical therapy  treatment for chronic constipation.     Today patient was learning ways to strengthen his abdominals to assist with pushing the stool out. He is having 1-2 bowel movement per day now with the new natural mixture he is using. He does the abdominal massage daily. Patient will benefit from skilled therapy to improve pelvic floor coordination to be able to push stool out.   OBJECTIVE IMPAIRMENTS: decreased cognition, decreased endurance, decreased strength, increased fascial restrictions, and increased muscle spasms.   ACTIVITY LIMITATIONS: toileting  PARTICIPATION LIMITATIONS: community activity  PERSONAL FACTORS: Time since onset of injury/illness/exacerbation are also affecting patient's functional outcome.   REHAB POTENTIAL: Good  CLINICAL DECISION MAKING: Evolving/moderate complexity  EVALUATION COMPLEXITY: Moderate   GOALS: Goals reviewed with patient?  Yes  SHORT TERM GOALS: Target date: 09/26/23  Patient independent with abdominal massage.  Baseline: Goal status: Met 09/16/23  2.  Patient is able to perform diaphragmatic breathing to relax the pelvic floor.  Baseline:  Goal status: Met 09/16/23  3.  Patient educated on how to push the stool out with breath and relaxation of the pelvic floor.  Baseline:  Goal status: Met 10/24/23     LONG TERM GOALS: Target date: 11/21/23  Patient is independent with advanced HEP.  Baseline:  Goal status: INITIAL  2.  Patient is able to push the therapist finger out of the rectum so he is able to push stool out.  Baseline:  Goal status: INITIAL  3.  Patient is able to have a bowel movement with taking half of the laxatives.  Baseline:  Goal status: INITIAL  4.  Pelvic floor strength is >/= 3/5 with puborectalis coming forward so he is able to push stool out.  Baseline:  Goal status: INITIAL    PLAN:  PT FREQUENCY: 1x/week  PT DURATION: 12 weeks  PLANNED INTERVENTIONS: 97110-Therapeutic exercises, 97530- Therapeutic activity, 97112- Neuromuscular re-education, 97535- Self Care, 16109- Manual therapy, [id number]- Electrical stimulation (unattended), Patient/Family education, Dry Needling, and Biofeedback  PLAN FOR NEXT SESSION:  rusi to work on pelvic floor drop; review abdominal  exercises   Marsha Skeen, PT 10/24/23 10:18 AM

## 2023-11-04 ENCOUNTER — Encounter: Payer: Self-pay | Admitting: Physical Therapy

## 2023-11-04 ENCOUNTER — Ambulatory Visit: Admitting: Physical Therapy

## 2023-11-04 DIAGNOSIS — M6281 Muscle weakness (generalized): Secondary | ICD-10-CM

## 2023-11-04 DIAGNOSIS — R278 Other lack of coordination: Secondary | ICD-10-CM

## 2023-11-04 DIAGNOSIS — R252 Cramp and spasm: Secondary | ICD-10-CM

## 2023-11-04 NOTE — Therapy (Signed)
 OUTPATIENT PHYSICAL THERAPY MALE PELVIC TREATMENT   Patient Name: Lawrence [name] MRN: [id number] DOB:10/24/1965, 58 y.o., male Today's Date: 11/04/2023  END OF SESSION:  PT End of Session - 11/04/23 1013     Visit Number 5    Date for PT Re-Evaluation 11/21/23    Authorization Type [employer]    Authorization - Visit Number 5    Authorization - Number of Visits 10    PT Start Time 1015    PT Stop Time 1055    PT Time Calculation (min) 40 min    Activity Tolerance Patient tolerated treatment well    Behavior During Therapy WFL for tasks assessed/performed             Past Medical History:  Diagnosis Date   Chronic constipation    Chronic systolic heart failure (HCC)    Diabetes mellitus without complication (HCC)    HTN (hypertension)    Hyperlipidemia    STEMI (ST elevation myocardial infarction) (HCC)    Past Surgical History:  Procedure Laterality Date   CORONARY STENT INTERVENTION N/A 02/12/2021   Procedure: CORONARY STENT INTERVENTION;  Surgeon: Odie Benne, MD;  Location: [facility];  Service: Cardiovascular;  Laterality: N/A;   CORONARY/GRAFT ACUTE MI REVASCULARIZATION N/A 02/12/2021   Procedure: Coronary/Graft Acute MI Revascularization;  Surgeon: Odie Benne, MD;  Location: [facility];  Service: Cardiovascular;  Laterality: N/A;   EXTERNAL FIXATION LEG Right 02/27/2016   Procedure: EXTERNAL FIXATION LEG;  Surgeon: Winston Hawking, MD;  Location: [facility] OR;  Service: Orthopedics;  Laterality: Right;  reduction and external fixation right tibial plateau fracture   EXTERNAL FIXATION REMOVAL Right 03/04/2016   Procedure: REMOVAL EXTERNAL FIXATION LEG;  Surgeon: Hardy Lia, MD;  Location: [facility] OR;  Service: Orthopedics;  Laterality: Right;   HARVEST BONE GRAFT Left 03/04/2016   Procedure: Rimmed IM aspirate;  Surgeon: Hardy Lia, MD;  Location: [facility] OR;  Service: Orthopedics;  Laterality: Left;   LEFT HEART CATH AND CORONARY ANGIOGRAPHY N/A  02/12/2021   Procedure: LEFT HEART CATH AND CORONARY ANGIOGRAPHY;  Surgeon: Odie Benne, MD;  Location: [facility];  Service: Cardiovascular;  Laterality: N/A;   ORIF TIBIA PLATEAU Right 03/04/2016   Procedure: OPEN REDUCTION INTERNAL FIXATION (ORIF) TIBIAL PLATEAU;  Surgeon: Hardy Lia, MD;  Location: [facility] OR;  Service: Orthopedics;  Laterality: Right;   Patient Active Problem List   Diagnosis Date Noted   Status post coronary artery stent placement    Hyperlipidemia 02/16/2021   Hypertension 02/16/2021   Ischemic cardiomyopathy 02/16/2021   STEMI (ST elevation myocardial infarction) [facility])    Retained orthopedic hardware 06/12/2018   Unilateral primary osteoarthritis, right knee 09/19/2017   Chronic pain of right knee 09/19/2017   Status post arthroscopy of right knee 09/19/2017   Fall 03/08/2016   Fever 03/05/2016   Diabetes (HCC) 02/28/2016   Tibial plateau fracture 02/27/2016    PCP: Margueritte Shilling B,MD  REFERRING PROVIDER: Ace Holder, MD   REFERRING DIAG: K59.09 (ICD-10-CM) - Chronic constipation   THERAPY DIAG:  Other lack of coordination  Cramp and spasm  Muscle weakness (generalized)  Rationale for Evaluation and Treatment: Rehabilitation  ONSET DATE: 2017  SUBJECTIVE:  SUBJECTIVE STATEMENT: I am feeling better. I have a bowel movement daily. I am able to push the stool out. I have had relief after the bowel movement.  Fluid intake: does not drink a lot of water  PAIN:  Are you having pain? No  PRECAUTIONS: None  RED FLAGS: None   WEIGHT BEARING RESTRICTIONS: No  FALLS:  Has patient fallen in last 6 months? No  OCCUPATION: not working  ACTIVITY LEVEL : walk 1/2 hour every other day  PLOF: Independent  PATIENT GOALS: reduce  constipation  PERTINENT HISTORY:  Diabetes, Hypertension, CAD, Ischemic cardiomyopathy; coronary stent;  Sexual abuse: No  BOWEL MOVEMENT: Pain with bowel movement: No Type of bowel movement:Type (Bristol Stool Scale) Type 7, Frequency has a bowel movement with his mediation and laxatives, and Strain no 10/21/23: stool comes out like a smoothie Fully empty rectum: No Leakage: No Pads: No Fiber supplement/laxative Yes   URINATION: Leakage: none       OBJECTIVE:  Note: Objective measures were completed at Evaluation unless otherwise noted.  DIAGNOSTIC FINDINGS:  none   COGNITION: Overall cognitive status: Within functional limits for tasks assessed     SENSATION: Light touch: Appears intact    GAIT: Assistive device utilized: Single point cane Comments: Patient has a limp on the right due to an injury in the past  POSTURE: No Significant postural limitations     LOWER EXTREMITY ROM:  Passive ROM Right eval Left eval  Hip internal rotation 5 5  Hip external rotation 80 40   (Blank rows = not tested)  PALPATION:   General: no tenderness  Pelvic Alignment: correct  Abdominal: Decreased movement of the lower rib cage. Not able to perform diaphragmatic breathing.                 External Perineal Exam: no tenderness                             Internal Pelvic Floor: increased tightness in the rectum, the puborectalis does not come forward.   Patient confirms identification and approves PT to assess internal pelvic floor and treatment Yes  PELVIC MMT:   MMT eval  Internal Anal Sphincter 0/5  External Anal Sphincter 1/5  Puborectalis 0/5  (Blank rows = not tested)        TONE: Increased tone of pelvic floor.    TODAY'S TREATMENT:   11/04/23 Exercises: Strengthening: Bridge with marching 20 x with green band around knees Supine with green band around knees with knees at 90/90 alternate shoulder and hip flexion 20 x  Quadruped lift left arm  and right knee 20 x Bird dog with leaning on counter due to difficulty putting weight on right knee 2 x 10 Sitting pelvic floor contraction holding 15 sec 10 x     10/24/23 Exercises: Strengthening: Bridge with marching 20 x Transverse abdominus contraction 10 x  Supine marching with abdominal contraction 20 x Supine abdominal contraction with alternate shoulder flexion with green band Wall plank with holding green band and move outward working the abdominals 20 x  Sitting pallof with green band 20 x each way Sitting bilateral shoulder extension with green band 20 x Sitting bilateral shoulder extension with marching using green band 20 x    10/21/23 Neuromuscular re-education: Pelvic floor contraction training: Using the rusi on the transperineal setting working on pelvic floor contraction with ultrasound head on the perineal area. Patient is able to contract  his pelvic floor but not able to relax. He will contract when bearing down.  Using the RUSI on the diaphragm working on breathing in to bring the diaphragm downward to relax the pelvic floor. He needed vc to breath in deep enough to fill his abdomen and relax the pelvic floor.                                                                                                                               DATE: 08/29/23  EVAL See below   PATIENT EDUCATION:  11/04/23 Education details: Access Code: [email or web address] Person educated: Patient Education method: Explanation, Demonstration, Tactile cues, Verbal cues, and Handouts Education comprehension: verbalized understanding, returned demonstration, verbal cues required, tactile cues required, and needs further education  HOME EXERCISE PROGRAM: 11/04/23 Access Code: [email or web address] URL: [email or web address] Date: 11/04/2023 Prepared by: Marsha Skeen  Exercises - Seated Diaphragmatic Breathing  - 1 x daily - 7 x weekly - 1 sets - 10 reps - Standing Plank on Wall with Reaches and  Resistance  - 1 x daily - 7 x weekly - 2 sets - 10 reps - Seated Shoulder Extension and Scapular Retraction with Resistance  - 1 x daily - 7 x weekly - 2 sets - 10 reps - Standing Anti-Rotation Press with Anchored Resistance  - 1 x daily - 7 x weekly - 2 sets - 10 reps - Supine Dead Bug with Leg Extension  - 1 x daily - 3 x weekly - 2 sets - 10 reps - Single Leg Bridge with Resistance Loop  - 1 x daily - 3 x weekly - 2 sets - 10 reps - Quadruped Pelvic Floor Contraction with Opposite Arm and Leg Lift  - 1 x daily - 3 x weekly - 2 sets - 10 reps - Bird Dog on Counter  - 1 x daily - 3 x weekly - 2 sets - 10 reps - Seated Pelvic Floor Contraction  - 3 x daily - 7 x weekly - 1 sets - 5 reps - 15 sec hold   ASSESSMENT:  CLINICAL IMPRESSION: Patient is a 58 y.o. male who was seen today for physical therapy  treatment for chronic constipation.  Patient is able to have 2-3 bowel movements per day. He drinks a shake with a lot of fiber that is all natural and helping him be consistent with his bowel movements. He is able to push the stool out with greater ease. Patient was not comfortable with therapist assess rectal strength today. He continues to progress his abdominal exercises.  Patient will benefit from skilled therapy to improve pelvic floor coordination to be able to push stool out.   OBJECTIVE IMPAIRMENTS: decreased cognition, decreased endurance, decreased strength, increased fascial restrictions, and increased muscle spasms.   ACTIVITY LIMITATIONS: toileting  PARTICIPATION LIMITATIONS: community activity  PERSONAL FACTORS: Time since onset of injury/illness/exacerbation are also affecting patient's functional outcome.   REHAB POTENTIAL: Good  CLINICAL DECISION MAKING:  Evolving/moderate complexity  EVALUATION COMPLEXITY: Moderate   GOALS: Goals reviewed with patient? Yes  SHORT TERM GOALS: Target date: 09/26/23  Patient independent with abdominal massage.  Baseline: Goal status:  Met 09/16/23  2.  Patient is able to perform diaphragmatic breathing to relax the pelvic floor.  Baseline:  Goal status: Met 09/16/23  3.  Patient educated on how to push the stool out with breath and relaxation of the pelvic floor.  Baseline:  Goal status: Met 10/24/23     LONG TERM GOALS: Target date: 11/21/23  Patient is independent with advanced HEP.  Baseline:  Goal status: INITIAL  2.  Patient is able to push the therapist finger out of the rectum so he is able to push stool out.  Baseline:  Goal status: INITIAL  3.  Patient is able to have a bowel movement with taking half of the laxatives.  Baseline:  Goal status: met 11/04/23  4.  Pelvic floor strength is >/= 3/5 with puborectalis coming forward so he is able to push stool out.  Baseline:  Goal status: INITIAL    PLAN:  PT FREQUENCY: 1x/week  PT DURATION: 12 weeks  PLANNED INTERVENTIONS: 97110-Therapeutic exercises, 97530- Therapeutic activity, [id number]- Neuromuscular re-education, 97535- Self Care, [id number]- Manual therapy, [id number]- Electrical stimulation (unattended), Patient/Family education, Dry Needling, and Biofeedback  PLAN FOR NEXT SESSION:  update HEP, if still doing well then discharge   Marsha Skeen, PT 11/04/23 10:58 AM

## 2023-11-07 ENCOUNTER — Ambulatory Visit: Attending: Gastroenterology | Admitting: Physical Therapy

## 2023-11-14 ENCOUNTER — Encounter: Admitting: Physical Therapy

## 2023-11-21 ENCOUNTER — Ambulatory Visit: Attending: Gastroenterology | Admitting: Physical Therapy

## 2023-11-21 ENCOUNTER — Encounter: Payer: Self-pay | Admitting: Physical Therapy

## 2023-11-21 DIAGNOSIS — R252 Cramp and spasm: Secondary | ICD-10-CM | POA: Diagnosis not present

## 2023-11-21 DIAGNOSIS — R278 Other lack of coordination: Secondary | ICD-10-CM | POA: Diagnosis not present

## 2023-11-21 DIAGNOSIS — M6281 Muscle weakness (generalized): Secondary | ICD-10-CM | POA: Insufficient documentation

## 2023-11-21 NOTE — Therapy (Addendum)
 OUTPATIENT PHYSICAL THERAPY MALE PELVIC TREATMENT   Patient Name: Lawrence [name] MRN: [id number] DOB:[birth year], 58 y.o., male Today's Date: 03/05/2024  END OF SESSION:   11/21/2023         1234 0938 0930 1017 1013 1016  PT Visits / Re-Eval   Visit Number 1 2 3 4 5 6   Number of Visits -- -- -- -- -- --  Date for Recertification 11/21/2023 -- 11/21/2023 11/21/2023 11/21/2023 02/13/2024  Authorization   Authorization Type healthteam healthteam healthteam healthteam healthteam healthteam  Authorization Time Period -- -- -- -- -- --  Authorization - Visit Number 1 2 3 4 5 6   Authorization - Number of Visits 10 10 10 10 10 10   Progress Note Due on Visit -- -- -- -- -- --  PT Time Calculation   PT Start Time 1230 0936 0930 1015 1015 1015  PT Stop Time 1310 1015 1010 1055 1055 1055  PT Time Calculation (min) 40 min 39 min 40 min 40 min 40 min 40 min  PT - End of Session   Equipment Utilized During Treatment -- -- -- -- -- --  Activity Tolerance Patient tolerated treatment well Patient tolerated treatment well Patient tolerated treatment well Patient tolerated treatment well Patient tolerated treatment well Patient tolerated treatment well  Behavior During Therapy [facility] for tasks assessed/performed [facility] for tasks assessed/performed [facility] for tasks assessed/performed [facility] for tasks assessed/performed [facility] for tasks assessed/performed [facility] for tasks assessed/performed  Channing Pereyra, PT 03/05/24 10:41 AM   Past Medical History:  Diagnosis Date   Chronic constipation    Chronic systolic heart failure (HCC)    Diabetes mellitus without complication (HCC)    HTN (hypertension)    Hyperlipidemia    STEMI (ST elevation myocardial infarction) (HCC)    Past Surgical History:  Procedure Laterality Date   CORONARY STENT INTERVENTION N/A 02/12/2021   Procedure: CORONARY STENT INTERVENTION;  Surgeon: Verlin Lonni BIRCH, MD;  Location: [facility];  Service: Cardiovascular;  Laterality: N/A;    CORONARY/GRAFT ACUTE MI REVASCULARIZATION N/A 02/12/2021   Procedure: Coronary/Graft Acute MI Revascularization;  Surgeon: Verlin Lonni BIRCH, MD;  Location: [facility];  Service: Cardiovascular;  Laterality: N/A;   EXTERNAL FIXATION LEG Right 02/27/2016   Procedure: EXTERNAL FIXATION LEG;  Surgeon: Marcey Her, MD;  Location: [facility] OR;  Service: Orthopedics;  Laterality: Right;  reduction and external fixation right tibial plateau fracture   EXTERNAL FIXATION REMOVAL Right 03/04/2016   Procedure: REMOVAL EXTERNAL FIXATION LEG;  Surgeon: Ozell Bruch, MD;  Location: [facility] OR;  Service: Orthopedics;  Laterality: Right;   HARVEST BONE GRAFT Left 03/04/2016   Procedure: Rimmed IM aspirate;  Surgeon: Ozell Bruch, MD;  Location: [facility] OR;  Service: Orthopedics;  Laterality: Left;   LEFT HEART CATH AND CORONARY ANGIOGRAPHY N/A 02/12/2021   Procedure: LEFT HEART CATH AND CORONARY ANGIOGRAPHY;  Surgeon: Verlin Lonni BIRCH, MD;  Location: [facility];  Service: Cardiovascular;  Laterality: N/A;   ORIF TIBIA PLATEAU Right 03/04/2016   Procedure: OPEN REDUCTION INTERNAL FIXATION (ORIF) TIBIAL PLATEAU;  Surgeon: Ozell Bruch, MD;  Location: [facility] OR;  Service: Orthopedics;  Laterality: Right;   Patient Active Problem List   Diagnosis Date Noted   Status post coronary artery stent placement    Hyperlipidemia 02/16/2021   Hypertension 02/16/2021   Ischemic cardiomyopathy 02/16/2021   STEMI (ST elevation myocardial infarction) [facility])    Retained orthopedic hardware 06/12/2018   Unilateral primary osteoarthritis, right knee 09/19/2017   Chronic  pain of right knee 09/19/2017   Status post arthroscopy of right knee 09/19/2017   Fall 03/08/2016   Fever 03/05/2016   Diabetes (HCC) 02/28/2016   Tibial plateau fracture 02/27/2016    PCP: Debby Chillington B,MD  REFERRING PROVIDER: Leigh Elspeth SQUIBB, MD   REFERRING DIAG: K59.09 (ICD-10-CM) - Chronic constipation   THERAPY DIAG:  Other lack of  coordination - Plan: PT plan of care cert/re-cert  Cramp and spasm - Plan: PT plan of care cert/re-cert  Muscle weakness (generalized) - Plan: PT plan of care cert/re-cert  Rationale for Evaluation and Treatment: Rehabilitation  ONSET DATE: 2017  SUBJECTIVE:                                                                                                                                                                                           SUBJECTIVE STATEMENT: I have a bowel movement daily with taking my natural mixture. He uses the diaphragmatic breathing that helps the ability to have a bowel movement.   Fluid intake: does not drink a lot of water  PAIN:  Are you having pain? No  PRECAUTIONS: None  RED FLAGS: None   WEIGHT BEARING RESTRICTIONS: No  FALLS:  Has patient fallen in last 6 months? No  OCCUPATION: not working  ACTIVITY LEVEL : walk 1/2 hour every other day  PLOF: Independent  PATIENT GOALS: reduce constipation  PERTINENT HISTORY:  Diabetes, Hypertension, CAD, Ischemic cardiomyopathy; coronary stent;  Sexual abuse: No  BOWEL MOVEMENT: Pain with bowel movement: No Type of bowel movement:Type (Bristol Stool Scale) Type 7, Frequency has a bowel movement with his mediation and laxatives, and Strain no 10/21/23: stool comes out like a smoothie Fully empty rectum: No Leakage: No Pads: No Fiber supplement/laxative Yes   URINATION: Leakage: none       OBJECTIVE:  Note: Objective measures were completed at Evaluation unless otherwise noted.  DIAGNOSTIC FINDINGS:  none   COGNITION: Overall cognitive status: Within functional limits for tasks assessed     SENSATION: Light touch: Appears intact    GAIT: Assistive device utilized: Single point cane Comments: Patient has a limp on the right due to an injury in the past  POSTURE: No Significant postural limitations     LOWER EXTREMITY ROM:  Passive ROM Right eval Left eval Left   11/21/23  Hip internal rotation 5 5 20   Hip external rotation 80 40 40   (Blank rows = not tested)  PALPATION:   General: no tenderness  Pelvic Alignment: correct  Abdominal: Decreased movement of the lower rib cage. Not able to perform diaphragmatic breathing.  11/21/23: able to perform in  supine and sitting just needs cues to not extend at the thoracic lumbar junction.                 External Perineal Exam: no tenderness                             Internal Pelvic Floor: increased tightness in the rectum, the puborectalis does not come forward.   Patient confirms identification and approves PT to assess internal pelvic floor and treatment Yes 11/21/23: patient did not want to have strength assessed internally.   PELVIC MMT:   MMT eval  Internal Anal Sphincter 0/5  External Anal Sphincter 1/5  Puborectalis 0/5  (Blank rows = not tested)        TONE: Increased tone of pelvic floor.    TODAY'S TREATMENT:   11/21/23 Neuromuscular re-education: Core facilitation: Bird dog with leaning on counter due to difficulty putting weight on right knee 2 x 10 tactile cues to contract the abdomen and not twist his trunk Pelvic floor contraction training: Sitting anal contraction with verbal cues to not hold his breath, relax his face, feel the rectum lift and contract hold 15 sec, 10 x  Down training: Diaphragmatic breathing in sitting with verbal cues to not lift his chest 10 times Exercises: Stretches/mobility: Sitting piriformis stretch holding 30 seconds to improve hip ER Strengthening: Dead bug with extending the leg and verbal cues to do the opposite arm and leg Supine marching with band around the knees to work the core    11/04/23 Exercises: Strengthening: Bridge with marching 20 x with green band around knees Supine with green band around knees with knees at 90/90 alternate shoulder and hip flexion 20 x  Quadruped lift left arm and right knee 20 x Bird dog with leaning  on counter due to difficulty putting weight on right knee 2 x 10 Sitting pelvic floor contraction holding 15 sec 10 x     10/24/23 Exercises: Strengthening: Bridge with marching 20 x Transverse abdominus contraction 10 x  Supine marching with abdominal contraction 20 x Supine abdominal contraction with alternate shoulder flexion with green band Wall plank with holding green band and move outward working the abdominals 20 x  Sitting pallof with green band 20 x each way Sitting bilateral shoulder extension with green band 20 x Sitting bilateral shoulder extension with marching using green band 20 x    PATIENT EDUCATION:  11/04/23 Education details: Access Code: [email or web address] Person educated: Patient Education method: Programmer, multimedia, Demonstration, Actor cues, Verbal cues, and Handouts Education comprehension: verbalized understanding, returned demonstration, verbal cues required, tactile cues required, and needs further education  HOME EXERCISE PROGRAM: 11/04/23 Access Code: [email or web address] URL: [email or web address] Date: 11/04/2023 Prepared by: Channing Pereyra  Exercises - Seated Diaphragmatic Breathing  - 1 x daily - 7 x weekly - 1 sets - 10 reps - Standing Plank on Wall with Reaches and Resistance  - 1 x daily - 7 x weekly - 2 sets - 10 reps - Seated Shoulder Extension and Scapular Retraction with Resistance  - 1 x daily - 7 x weekly - 2 sets - 10 reps - Standing Anti-Rotation Press with Anchored Resistance  - 1 x daily - 7 x weekly - 2 sets - 10 reps - Supine Dead Bug with Leg Extension  - 1 x daily - 3 x weekly - 2 sets - 10 reps - Single Leg Bridge with Resistance Loop  - 1 x daily -  3 x weekly - 2 sets - 10 reps - Quadruped Pelvic Floor Contraction with Opposite Arm and Leg Lift  - 1 x daily - 3 x weekly - 2 sets - 10 reps - Bird Dog on Counter  - 1 x daily - 3 x weekly - 2 sets - 10 reps - Seated Pelvic Floor Contraction  - 3 x daily - 7 x weekly - 1 sets - 5 reps - 15 sec  hold   ASSESSMENT:  CLINICAL IMPRESSION: Patient is a 58 y.o. male who was seen today for physical therapy  treatment for chronic constipation.  Patient is able to have 2-3 bowel movements per day. He drinks a shake with a lot of fiber that is all natural and helping him be consistent with his bowel movements. He is able to push the stool out with greater ease.   Patient does not want the therapist to perform internal assessment for strength. His left hip has tight external rotation. Patient does his abdominal massage. Patient will do well coming in 1 time per month to update his HEP and to make sure the program is helping his constipation. Patient will benefit from skilled therapy to improve pelvic floor coordination to be able to push stool out.   OBJECTIVE IMPAIRMENTS: decreased cognition, decreased endurance, decreased strength, increased fascial restrictions, and increased muscle spasms.   ACTIVITY LIMITATIONS: toileting  PARTICIPATION LIMITATIONS: community activity  PERSONAL FACTORS: Time since onset of injury/illness/exacerbation are also affecting patient's functional outcome.   REHAB POTENTIAL: Good  CLINICAL DECISION MAKING: Evolving/moderate complexity  EVALUATION COMPLEXITY: Moderate   GOALS: Goals reviewed with patient? Yes  SHORT TERM GOALS: Target date: 09/26/23  Patient independent with abdominal massage.  Baseline: Goal status: Met 09/16/23  2.  Patient is able to perform diaphragmatic breathing to relax the pelvic floor.  Baseline:  Goal status: Met 09/16/23  3.  Patient educated on how to push the stool out with breath and relaxation of the pelvic floor.  Baseline:  Goal status: Met 10/24/23     LONG TERM GOALS: Target date: 02/13/24/25  Patient is independent with advanced HEP.  Baseline:  Goal status: ongoing 11/21/23  2.  Patient is able to push the therapist finger out of the rectum so he is able to push stool out.  Baseline:  Goal status: ongoing  11/21/23  3.  Patient is able to have a bowel movement with taking half of the laxatives.  Baseline: has to take his natural drink Goal status: met 11/04/23  4.  Pelvic floor strength is >/= 3/5 with puborectalis coming forward so he is able to push stool out.  Baseline: Patient reports he is able to push his stool out with greater ease Goal status: ongoing 11/21/23    PLAN:  PT FREQUENCY: 1x/month  PT DURATION: 12 weeks  PLANNED INTERVENTIONS: 97110-Therapeutic exercises, 97530- Therapeutic activity, 97112- Neuromuscular re-education, 97535- Self Care, 02859- Manual therapy, G0283- Electrical stimulation (unattended), Patient/Family education, Dry Needling, and Biofeedback  PLAN FOR NEXT SESSION:  update HEP   Channing Pereyra, PT 03/05/24 10:39 AM  PHYSICAL THERAPY DISCHARGE SUMMARY  Visits from Start of Care: 6  Current functional level related to goals / functional outcomes: Spoke to patient on the phone and he feels he is doing well and wants to be discharged.    Remaining deficits: See above.    Education / Equipment: HEP   Patient agrees to discharge. Patient goals were not met. Patient is being discharged due to being  pleased with the current functional level. Thank you for the referral.   Channing Pereyra, PT 03/05/24 10:39 AM

## 2023-11-25 ENCOUNTER — Other Ambulatory Visit (HOSPITAL_COMMUNITY): Payer: Self-pay | Admitting: Cardiology

## 2023-12-02 DIAGNOSIS — Z6833 Body mass index (BMI) 33.0-33.9, adult: Secondary | ICD-10-CM | POA: Diagnosis not present

## 2023-12-02 DIAGNOSIS — E1169 Type 2 diabetes mellitus with other specified complication: Secondary | ICD-10-CM | POA: Diagnosis not present

## 2023-12-02 DIAGNOSIS — I252 Old myocardial infarction: Secondary | ICD-10-CM | POA: Diagnosis not present

## 2023-12-02 DIAGNOSIS — I255 Ischemic cardiomyopathy: Secondary | ICD-10-CM | POA: Diagnosis not present

## 2023-12-02 DIAGNOSIS — K5904 Chronic idiopathic constipation: Secondary | ICD-10-CM | POA: Diagnosis not present

## 2023-12-02 DIAGNOSIS — I10 Essential (primary) hypertension: Secondary | ICD-10-CM | POA: Diagnosis not present

## 2023-12-02 DIAGNOSIS — E785 Hyperlipidemia, unspecified: Secondary | ICD-10-CM | POA: Diagnosis not present

## 2023-12-02 DIAGNOSIS — E559 Vitamin D deficiency, unspecified: Secondary | ICD-10-CM | POA: Diagnosis not present

## 2023-12-02 DIAGNOSIS — D539 Nutritional anemia, unspecified: Secondary | ICD-10-CM | POA: Diagnosis not present

## 2023-12-13 DIAGNOSIS — E875 Hyperkalemia: Secondary | ICD-10-CM | POA: Diagnosis not present

## 2023-12-25 ENCOUNTER — Other Ambulatory Visit (HOSPITAL_COMMUNITY): Payer: Self-pay | Admitting: Cardiology

## 2023-12-27 ENCOUNTER — Telehealth: Payer: Self-pay | Admitting: Gastroenterology

## 2023-12-27 MED ORDER — IBSRELA 50 MG PO TABS: 50.0000 mg | ORAL_TABLET | Freq: Two times a day (BID) | ORAL | 2 refills | Status: DC

## 2023-12-27 NOTE — Telephone Encounter (Signed)
 Inbound call from patient, would like samples of medication while he awaits for approval from insurance.

## 2023-12-27 NOTE — Telephone Encounter (Signed)
 Inbound call from patient, states Phillip is helping and would like prescription called into pharmacy [facility] in [place].

## 2023-12-27 NOTE — Telephone Encounter (Signed)
 Called patient. He is going on a trip on Thursday and will be gone for 3 weeks. Informed patient that I will leave multiple samples of IBSrela  for him to pick up on the 3rd floor. He was very Adult nurse

## 2023-12-27 NOTE — Telephone Encounter (Signed)
 Ibsrela  sent to [facility]. Called patient and let him know.

## 2023-12-30 DIAGNOSIS — E875 Hyperkalemia: Secondary | ICD-10-CM | POA: Diagnosis not present

## 2024-01-23 ENCOUNTER — Telehealth: Payer: Self-pay | Admitting: Physical Therapy

## 2024-01-23 ENCOUNTER — Ambulatory Visit: Attending: Gastroenterology | Admitting: Physical Therapy

## 2024-01-23 DIAGNOSIS — R278 Other lack of coordination: Secondary | ICD-10-CM | POA: Insufficient documentation

## 2024-01-23 DIAGNOSIS — R252 Cramp and spasm: Secondary | ICD-10-CM | POA: Insufficient documentation

## 2024-01-23 DIAGNOSIS — M6281 Muscle weakness (generalized): Secondary | ICD-10-CM | POA: Insufficient documentation

## 2024-01-23 NOTE — Telephone Encounter (Signed)
 Called patient about his missed appointment today at 10:15. Unable to leave a message due to voicemail is full.  Channing Pereyra, PT @8 /18/25@ 10:30 AM

## 2024-01-25 ENCOUNTER — Other Ambulatory Visit (HOSPITAL_COMMUNITY): Payer: Self-pay | Admitting: Cardiology

## 2024-02-03 ENCOUNTER — Telehealth: Payer: Self-pay | Admitting: Gastroenterology

## 2024-02-03 ENCOUNTER — Other Ambulatory Visit (HOSPITAL_COMMUNITY): Payer: Self-pay

## 2024-02-03 ENCOUNTER — Other Ambulatory Visit (HOSPITAL_COMMUNITY): Payer: Self-pay | Admitting: Family Medicine

## 2024-02-03 DIAGNOSIS — I5022 Chronic systolic (congestive) heart failure: Secondary | ICD-10-CM

## 2024-02-03 DIAGNOSIS — N481 Balanitis: Secondary | ICD-10-CM

## 2024-02-03 MED ORDER — IBSRELA 50 MG PO TABS: 50.0000 mg | ORAL_TABLET | Freq: Two times a day (BID) | ORAL | 5 refills | Status: AC

## 2024-02-03 NOTE — Telephone Encounter (Signed)
 Patient states he has not heard from the mail order pharmacy ([facility]) regarding his prescription for Ibsrella. Informed patient I will contact them. In the mean time I am going to send the prescription to his local pharmacy [facility]). Patient asked if he could some samples of Ibsrella while he waits to get the prescription. Informed patient I will leave samples if Ibsrella at our front desk for him to pick up. Patient verbalized understanding. Called [facility] and waited on hold for 13 minutes, left voicemail to have them call me back.

## 2024-02-03 NOTE — Telephone Encounter (Signed)
 Inbound call from patient requesting a call to discuss ibserla medication. Requesting to know if prescription can be called into pharmacy. Also requesting to know if in the meantime he can pick up more samples. Please advise, thank you

## 2024-02-08 ENCOUNTER — Telehealth: Payer: Self-pay

## 2024-02-08 ENCOUNTER — Other Ambulatory Visit (HOSPITAL_COMMUNITY): Payer: Self-pay

## 2024-02-08 NOTE — Telephone Encounter (Signed)
 Called [facility] and waited on hold for 23 minutes. Left a detailed message for them to return my call.

## 2024-02-08 NOTE — Telephone Encounter (Signed)
 PA request has been Submitted. New Encounter has been or will be created for follow up. For additional info see Pharmacy Prior Auth telephone encounter from 02-08-2024.

## 2024-02-08 NOTE — Telephone Encounter (Signed)
 The prescription for Iona was originally sent to [facility]. Patient has not heard from them and I have tried multiple times to reach out to them. RX team, I did send the prescription to his local pharmacy. Can you initiate a PA for Ibsrella please.

## 2024-02-08 NOTE — Telephone Encounter (Signed)
 Pharmacy Patient Advocate Encounter   Received notification from Pt Calls Messages that prior authorization for Ibsrela  50MG  tablets is required/requested.   Insurance verification completed.   The patient is insured through [facility] ADVANTAGE/RX ADVANCE .   Per test claim: PA required; PA submitted to above mentioned insurance via Latent Key/confirmation #/EOC [id number] Status is pending

## 2024-02-09 NOTE — Telephone Encounter (Signed)
 Pharmacy Patient Advocate Encounter  Received notification from [facility] [employer]/RX ADVANCE that Prior Authorization for Ibsrela  50MG  tablets has been APPROVED from 02-08-2024 to 06-06-2024   PA #/Case ID/Reference #: [id number]

## 2024-03-05 ENCOUNTER — Telehealth: Payer: Self-pay | Admitting: Physical Therapy

## 2024-03-05 ENCOUNTER — Ambulatory Visit: Attending: Gastroenterology | Admitting: Physical Therapy

## 2024-03-05 DIAGNOSIS — R252 Cramp and spasm: Secondary | ICD-10-CM | POA: Insufficient documentation

## 2024-03-05 DIAGNOSIS — R278 Other lack of coordination: Secondary | ICD-10-CM | POA: Insufficient documentation

## 2024-03-05 DIAGNOSIS — M6281 Muscle weakness (generalized): Secondary | ICD-10-CM | POA: Insufficient documentation

## 2024-03-05 NOTE — Telephone Encounter (Signed)
 Spoke to patient on the phone. He is doing well and wants to be discharged.  Channing Pereyra, PT @9 /29/25@ 10:38 AM

## 2024-03-05 NOTE — Progress Notes (Unsigned)
 HPI : seen in march  58 year old male here for a follow-up visit for chronic constipation.  He was last seen by [facility] May.    Recall he has a past medical history of diabetes, hypertension,anemia, hyperlipidemia,CAD,ischemic cardiomyopathy, and chronic constipation    He is accompanied by his wife today.  He reports longstanding constipation for many years.  He was seen in the ED back in July of last year for worsening constipation and he had significant stool burden in the left colon, was given enemas with relief.  I reviewed his prior notes, he has been on a variety of regimens for his bowels over the years.  Regarding prescription medicines he is been on Linzess , Amitiza, Trulance, none of which he states have provided any significant relief.  He is on a combination of over-the-counter laxatives to include gelatin laxatives, nighttime relief, he does not know the exact names of what he is taking but usually it is a combination of over-the-counter regimen he takes every other day to help produce stool.  He states he is very uncomfortable when he does not use the bathroom.   He has previously been followed by Dr. Teressa last colonoscopy was in 2020 and was completely normal.  His symptoms predate that colonoscopy. -follow-up with Dr. Leigh in 3 months  58 y.o. male here for assessment of the following   1. Chronic constipation     Ongoing longstanding chronic constipation which has been incompletely relieved with multiple laxatives.  Currently using a combination of over-the-counter laxatives after he has failed multiple prescription regimens as outlined above.  I performed a DRE today, this is my first time meeting him, I think he does have some component of dyssynergia based on that exam, could explain why he is not responding to well to traditional regimens.  Discussed options with him.  Recommend referral to pelvic floor PT to see if that will help his condition and treat any  underlying dyssynergia.  At the same time I would like to try him on Ibsrela  to see if that will provide any additional relief as well.  I gave him several days of free samples and if this helps in the can let me know and we will put in a prescription.  I reassured him of his last colonoscopy done in the setting of symptoms which was normal.  He does have a mild anemia but his iron studies are normal.  If no improvement over time despite pelvic floor PT and multiple regimens of laxatives can consider repeat colonoscopy but likely low yield.  He understands and agrees with the plan     PLAN: - referral to pelvic floor PT for  component of pelvic floor dyssynergia - start sample of IBSrela  50mg  BID - call for prescription if this helps - call if no improvement in a few weeks - colonoscopy UTD, reassured him, iron studies okay      Past Medical History:  Diagnosis Date   Chronic constipation    Chronic systolic heart failure (HCC)    Diabetes mellitus without complication (HCC)    HTN (hypertension)    Hyperlipidemia    STEMI (ST elevation myocardial infarction) (HCC)      Past Surgical History:  Procedure Laterality Date   CORONARY STENT INTERVENTION N/A 02/12/2021   Procedure: CORONARY STENT INTERVENTION;  Surgeon: Verlin Lonni BIRCH, MD;  [facility];  Service: Cardiovascular;  Laterality: N/A;   CORONARY/GRAFT ACUTE MI REVASCULARIZATION N/A 02/12/2021   Procedure:  Coronary/Graft Acute MI Revascularization;  Surgeon: Verlin Lonni BIRCH, MD;  Location: [facility];  Service: Cardiovascular;  Laterality: N/A;   EXTERNAL FIXATION LEG Right 02/27/2016   Procedure: EXTERNAL FIXATION LEG;  Surgeon: Marcey Her, MD;  Location: [facility] OR;  Service: Orthopedics;  Laterality: Right;  reduction and external fixation right tibial plateau fracture   EXTERNAL FIXATION REMOVAL Right 03/04/2016   Procedure: REMOVAL EXTERNAL FIXATION LEG;  Surgeon: Ozell Bruch, MD;   Location: [facility] OR;  Service: Orthopedics;  Laterality: Right;   HARVEST BONE GRAFT Left 03/04/2016   Procedure: Rimmed IM aspirate;  Surgeon: Ozell Bruch, MD;  Location: [facility] OR;  Service: Orthopedics;  Laterality: Left;   LEFT HEART CATH AND CORONARY ANGIOGRAPHY N/A 02/12/2021   Procedure: LEFT HEART CATH AND CORONARY ANGIOGRAPHY;  Surgeon: Verlin Lonni BIRCH, MD;  [facility]: [facility];  Service: Cardiovascular;  Laterality: N/A;   ORIF TIBIA PLATEAU Right 03/04/2016   Procedure: OPEN REDUCTION INTERNAL FIXATION (ORIF) TIBIAL PLATEAU;  Surgeon: Ozell Bruch, MD;  Location: [facility] OR;  Service: Orthopedics;  Laterality: Right;   Family History  Problem Relation Age of Onset   Cancer Mother    Cancer Father    Social History   Tobacco Use   Smoking status: Never   Smokeless tobacco: Never  Vaping Use   Vaping status: Never Used  Substance Use Topics   Alcohol use: Not Currently   Drug use: No   Current Outpatient Medications  Medication Sig Dispense Refill   albuterol (VENTOLIN HFA) 108 (90 Base) MCG/ACT inhaler as needed.     blood glucose meter kit and supplies KIT Dispense based on patient and insurance preference. Use up to four times daily as directed. (FOR ICD-9 250.00, 250.01). 1 each 0   carvedilol  (COREG ) 12.5 MG tablet Patient takes 1 & 1/2 tablet by mouth twice a day. 180 tablet 3   clopidogrel  (PLAVIX ) 75 MG tablet Take 1 tablet by mouth once daily 90 tablet 2   ezetimibe  (ZETIA ) 10 MG tablet Take 1 tablet by mouth once daily 90 tablet 3   FREESTYLE PRECISION NEO TEST test strip USE 1 STRIP TO CHECK GLUCOSE ONCE DAILY     furosemide  (LASIX ) 40 MG tablet Take 1 tablet by mouth once daily 90 tablet 0   Magnesium  Hydroxide (DULCOLAX PO) Take by mouth as needed. 2 in the morning and 3 in the night     metFORMIN  (GLUCOPHAGE -XR) 500 MG 24 hr tablet Take 500 mg by mouth 2 (two) times daily.     nitroGLYCERIN  (NITROSTAT ) 0.4 MG SL tablet Place 1 tablet (0.4 mg total) under  the tongue every 5 (five) minutes as needed for chest pain. 25 tablet 2   rosuvastatin  (CRESTOR ) 40 MG tablet Take 1 tablet (40 mg total) by mouth daily. 90 tablet 1   sacubitril -valsartan  (ENTRESTO ) 97-103 MG TAKE 1 TABLET BY MOUTH TWICE DAILY . APPOINTMENT REQUIRED FOR FUTURE REFILLS 180 tablet 3   spironolactone  (ALDACTONE ) 25 MG tablet Take 1/2 (one-half) tablet by mouth once daily 45 tablet 3   Tenapanor HCl (IBSRELA ) 50 MG TABS Take 50 mg by mouth 2 (two) times daily before a meal. 60 tablet 5   No current facility-administered medications for this visit.   Allergies  Allergen Reactions   Spironolactone      Hyperkalemia     Review of Systems: All systems reviewed and negative except where noted in HPI.    No results found.  Physical Exam: There were no vitals  taken for this visit. Constitutional: Pleasant,well-developed, ***male in no acute distress. HEENT: Normocephalic and atraumatic. Conjunctivae are normal. No scleral icterus. Neck supple.  Cardiovascular: Normal rate, regular rhythm.  Pulmonary/chest: Effort normal and breath sounds normal. No wheezing, rales or rhonchi. Abdominal: Soft, nondistended, nontender. Bowel sounds active throughout. There are no masses palpable. No hepatomegaly. Extremities: no edema Lymphadenopathy: No cervical adenopathy noted. Neurological: Alert and oriented to person place and time. Skin: Skin is warm and dry. No rashes noted. Psychiatric: Normal mood and affect. Behavior is normal.   ASSESSMENT: 58 y.o. male here for assessment of the following  No diagnosis found.  PLAN:   Debby Laveda NOVAK, MD

## 2024-03-06 ENCOUNTER — Ambulatory Visit: Admitting: Gastroenterology

## 2024-03-06 ENCOUNTER — Other Ambulatory Visit

## 2024-03-06 ENCOUNTER — Ambulatory Visit: Payer: Self-pay | Admitting: Gastroenterology

## 2024-03-06 VITALS — BP 130/74 | HR 87 | Ht 68.0 in | Wt 212.0 lb

## 2024-03-06 DIAGNOSIS — I5022 Chronic systolic (congestive) heart failure: Secondary | ICD-10-CM

## 2024-03-06 DIAGNOSIS — K5909 Other constipation: Secondary | ICD-10-CM

## 2024-03-06 DIAGNOSIS — D649 Anemia, unspecified: Secondary | ICD-10-CM

## 2024-03-06 LAB — CBC WITH DIFFERENTIAL/PLATELET
Basophils Absolute: 0.1 K/uL (ref 0.0–0.1)
Basophils Relative: 1 % (ref 0.0–3.0)
Eosinophils Absolute: 0.3 K/uL (ref 0.0–0.7)
Eosinophils Relative: 3.2 % (ref 0.0–5.0)
HCT: 35.3 % — ABNORMAL LOW (ref 39.0–52.0)
Hemoglobin: 11.8 g/dL — ABNORMAL LOW (ref 13.0–17.0)
Lymphocytes Relative: 15.6 % (ref 12.0–46.0)
Lymphs Abs: 1.3 K/uL (ref 0.7–4.0)
MCHC: 33.5 g/dL (ref 30.0–36.0)
MCV: 91 fl (ref 78.0–100.0)
Monocytes Absolute: 0.5 K/uL (ref 0.1–1.0)
Monocytes Relative: 5.7 % (ref 3.0–12.0)
Neutro Abs: 6.3 K/uL (ref 1.4–7.7)
Neutrophils Relative %: 74.5 % (ref 43.0–77.0)
Platelets: 287 K/uL (ref 150.0–400.0)
RBC: 3.88 Mil/uL — ABNORMAL LOW (ref 4.22–5.81)
RDW: 13.3 % (ref 11.5–15.5)
WBC: 8.4 K/uL (ref 4.0–10.5)

## 2024-03-06 LAB — TSH: TSH: 1.18 u[IU]/mL (ref 0.35–5.50)

## 2024-03-06 MED ORDER — LINACLOTIDE 290 MCG PO CAPS
290.0000 ug | ORAL_CAPSULE | Freq: Every day | ORAL | 0 refills | Status: AC
Start: 2024-03-06 — End: ?

## 2024-03-06 NOTE — Patient Instructions (Addendum)
 We have given you samples of the following medication to take: Linzess  290 mcg: Take every other day,  30 minutes before a meal (Stop Linzess  145 mcg)  Alternate IBSRELA  every other day alternating with Linzess  290 mcg  Take Magnesium  every day.  Please call your cardiologist and see what work up you need.  Once you have had the necessary work up, we can schedule you for a colonoscopy.  Please go to the lab in the basement of our building to have lab work done as you leave today. Hit B for basement when you get on the elevator.  When the doors open the lab is on your left.  We will call you with the results. Thank you.  Thank you for entrusting me with your care and for choosing [facility], Dr. Elspeth Naval    _______________________________________________________  If your blood pressure at your visit was 140/90 or greater, please contact your primary care physician to follow up on this.  _______________________________________________________  If you are age 59 or older, your body mass index should be between 23-30. Your Body mass index is 32.23 kg/m. If this is out of the aforementioned range listed, please consider follow up with your Primary Care Provider.  If you are age 46 or younger, your body mass index should be between 19-25. Your Body mass index is 32.23 kg/m. If this is out of the aformentioned range listed, please consider follow up with your Primary Care Provider.   ________________________________________________________  The [facility] providers would like to encourage you to use MYCHART to communicate with providers for non-urgent requests or questions.  Due to long hold times on the telephone, sending your provider a message by [facility] may be a faster and more efficient way to get a response.  Please allow 48 business hours for a response.  Please remember that this is for non-urgent requests.   _______________________________________________________  [facility] is using a team-based approach to care.  Your team is made up of your doctor and two to three APPS. Our APPS (Nurse Practitioners and Physician Assistants) work with your physician to ensure care continuity for you. They are fully qualified to address your health concerns and develop a treatment plan. They communicate directly with your gastroenterologist to care for you. Seeing the Advanced Practice Practitioners on your physician's team can help you by facilitating care more promptly, often allowing for earlier appointments, access to diagnostic testing, procedures, and other specialty referrals.

## 2024-03-12 DIAGNOSIS — Z6832 Body mass index (BMI) 32.0-32.9, adult: Secondary | ICD-10-CM | POA: Diagnosis not present

## 2024-03-12 DIAGNOSIS — I255 Ischemic cardiomyopathy: Secondary | ICD-10-CM | POA: Diagnosis not present

## 2024-03-12 DIAGNOSIS — I252 Old myocardial infarction: Secondary | ICD-10-CM | POA: Diagnosis not present

## 2024-03-12 DIAGNOSIS — I10 Essential (primary) hypertension: Secondary | ICD-10-CM | POA: Diagnosis not present

## 2024-03-12 DIAGNOSIS — D539 Nutritional anemia, unspecified: Secondary | ICD-10-CM | POA: Diagnosis not present

## 2024-03-12 DIAGNOSIS — E785 Hyperlipidemia, unspecified: Secondary | ICD-10-CM | POA: Diagnosis not present

## 2024-03-12 DIAGNOSIS — K5904 Chronic idiopathic constipation: Secondary | ICD-10-CM | POA: Diagnosis not present

## 2024-03-12 DIAGNOSIS — E1169 Type 2 diabetes mellitus with other specified complication: Secondary | ICD-10-CM | POA: Diagnosis not present

## 2024-03-14 ENCOUNTER — Ambulatory Visit: Payer: Self-pay | Admitting: Gastroenterology

## 2024-03-14 ENCOUNTER — Other Ambulatory Visit (INDEPENDENT_AMBULATORY_CARE_PROVIDER_SITE_OTHER)

## 2024-03-14 DIAGNOSIS — E113512 Type 2 diabetes mellitus with proliferative diabetic retinopathy with macular edema, left eye: Secondary | ICD-10-CM | POA: Diagnosis not present

## 2024-03-14 DIAGNOSIS — D649 Anemia, unspecified: Secondary | ICD-10-CM

## 2024-03-14 LAB — VITAMIN B12: Vitamin B-12: 692 pg/mL (ref 211–911)

## 2024-03-14 LAB — IBC + FERRITIN
Ferritin: 117.8 ng/mL (ref 22.0–322.0)
Iron: 99 ug/dL (ref 42–165)
Saturation Ratios: 31.3 % (ref 20.0–50.0)
TIBC: 316.4 ug/dL (ref 250.0–450.0)
Transferrin: 226 mg/dL (ref 212.0–360.0)

## 2024-03-20 NOTE — Telephone Encounter (Addendum)
 Patient called back, he states he would like a referral to hematology. Please advise.

## 2024-03-28 ENCOUNTER — Other Ambulatory Visit: Payer: Self-pay | Admitting: Hematology and Oncology

## 2024-03-28 DIAGNOSIS — D649 Anemia, unspecified: Secondary | ICD-10-CM

## 2024-03-29 ENCOUNTER — Inpatient Hospital Stay

## 2024-03-29 ENCOUNTER — Other Ambulatory Visit: Payer: Self-pay

## 2024-03-29 ENCOUNTER — Encounter: Payer: Self-pay | Admitting: Hematology and Oncology

## 2024-03-29 ENCOUNTER — Inpatient Hospital Stay: Attending: Hematology and Oncology | Admitting: Hematology and Oncology

## 2024-03-29 VITALS — BP 119/64 | HR 56 | Temp 98.9°F | Resp 18 | Ht 68.0 in | Wt 213.3 lb

## 2024-03-29 DIAGNOSIS — Z79899 Other long term (current) drug therapy: Secondary | ICD-10-CM | POA: Diagnosis not present

## 2024-03-29 DIAGNOSIS — I5022 Chronic systolic (congestive) heart failure: Secondary | ICD-10-CM | POA: Insufficient documentation

## 2024-03-29 DIAGNOSIS — I110 Hypertensive heart disease with heart failure: Secondary | ICD-10-CM | POA: Diagnosis not present

## 2024-03-29 DIAGNOSIS — D649 Anemia, unspecified: Secondary | ICD-10-CM | POA: Diagnosis not present

## 2024-03-29 LAB — CBC WITH DIFFERENTIAL (CANCER CENTER ONLY)
Abs Immature Granulocytes: 0.01 K/uL (ref 0.00–0.07)
Basophils Absolute: 0.1 K/uL (ref 0.0–0.1)
Basophils Relative: 1 %
Eosinophils Absolute: 0.4 K/uL (ref 0.0–0.5)
Eosinophils Relative: 5 %
HCT: 35.8 % — ABNORMAL LOW (ref 39.0–52.0)
Hemoglobin: 12 g/dL — ABNORMAL LOW (ref 13.0–17.0)
Immature Granulocytes: 0 %
Lymphocytes Relative: 22 %
Lymphs Abs: 1.6 K/uL (ref 0.7–4.0)
MCH: 30.2 pg (ref 26.0–34.0)
MCHC: 33.5 g/dL (ref 30.0–36.0)
MCV: 89.9 fL (ref 80.0–100.0)
Monocytes Absolute: 0.5 K/uL (ref 0.1–1.0)
Monocytes Relative: 7 %
Neutro Abs: 4.5 K/uL (ref 1.7–7.7)
Neutrophils Relative %: 65 %
Platelet Count: 230 K/uL (ref 150–400)
RBC: 3.98 MIL/uL — ABNORMAL LOW (ref 4.22–5.81)
RDW: 12.4 % (ref 11.5–15.5)
WBC Count: 7.1 K/uL (ref 4.0–10.5)
nRBC: 0 % (ref 0.0–0.2)

## 2024-03-29 LAB — FERRITIN: Ferritin: 181 ng/mL (ref 24–336)

## 2024-03-29 LAB — CMP (CANCER CENTER ONLY)
ALT: 11 U/L (ref 0–44)
AST: 13 U/L — ABNORMAL LOW (ref 15–41)
Albumin: 4.7 g/dL (ref 3.5–5.0)
Alkaline Phosphatase: 117 U/L (ref 38–126)
Anion gap: 11 (ref 5–15)
BUN: 26 mg/dL — ABNORMAL HIGH (ref 6–20)
CO2: 23 mmol/L (ref 22–32)
Calcium: 9.9 mg/dL (ref 8.9–10.3)
Chloride: 100 mmol/L (ref 98–111)
Creatinine: 1.13 mg/dL (ref 0.61–1.24)
GFR, Estimated: >60 mL/min (ref >60)
Glucose, Bld: 274 mg/dL — ABNORMAL HIGH (ref 70–99)
Potassium: 4.9 mmol/L (ref 3.5–5.1)
Sodium: 134 mmol/L — ABNORMAL LOW (ref 135–145)
Total Bilirubin: 0.3 mg/dL (ref 0.0–1.2)
Total Protein: 7.8 g/dL (ref 6.5–8.1)

## 2024-03-29 LAB — TSH: TSH: 1.19 u[IU]/mL (ref 0.350–4.500)

## 2024-03-29 LAB — IRON AND TIBC
Iron: 78 ug/dL (ref 45–182)
Saturation Ratios: 22 % (ref 17.9–39.5)
TIBC: 354 ug/dL (ref 250–450)
UIBC: 276 ug/dL

## 2024-03-29 LAB — VITAMIN B12: Vitamin B-12: 878 pg/mL (ref 180–914)

## 2024-03-29 LAB — FOLATE: Folate: 8.6 ng/mL (ref >5.9)

## 2024-03-29 NOTE — Progress Notes (Signed)
 [facility] CONSULT NOTE  Patient Care Team: Erick Greig LABOR, NP as PCP - General (Internal Medicine) Verlin Lonni BIRCH, MD as PCP - Cardiology (Cardiology)  ASSESSMENT & PLAN:  Anemia: Recent mild anemia with normal iron studies. He suffers from severe constipation and has been evaluated by GI with negative findings. Today, CBC reveals hemoglobin 12.0. Iron studies are pending along with SPEP. He will return to clinic in 2 weeks for repeat evaluation.   All questions were answered. The patient knows to call the clinic with any problems, questions or concerns.  The total time spent in the appointment was 45 minutes encounter with patients including review of chart and various tests results, discussions about plan of care and coordination of care plan  Eleanor LABOR Bach, NP 10/23/202511:52 AM   CHIEF COMPLAINTS/PURPOSE OF CONSULTATION:  Anemia  HISTORY OF PRESENTING ILLNESS:  Lawrence [name] 58 y.o. male is here because of anemia  He was found to have abnormal CBC from 10/23 He denies recent chest pain on exertion, shortness of breath on minimal exertion, pre-syncopal episodes, or palpitations. He had not noticed any recent bleeding such as epistaxis, hematuria or hematochezia The patient denies over the counter NSAID ingestion. He is not  on antiplatelets agents. His last colonoscopy was this year He had no prior history or diagnosis of cancer. His age appropriate screening programs are up-to-date. He denies any pica and eats a variety of diet. He never donated blood or received blood transfusion The patient was not prescribed oral iron supplements  MEDICAL HISTORY:  Past Medical History:  Diagnosis Date   Chronic constipation    Chronic systolic heart failure (HCC)    Diabetes mellitus without complication (HCC)    HTN (hypertension)    Hyperlipidemia    STEMI (ST elevation myocardial infarction) (HCC)     SURGICAL HISTORY: Past Surgical History:   Procedure Laterality Date   CORONARY STENT INTERVENTION N/A 02/12/2021   Procedure: CORONARY STENT INTERVENTION;  Surgeon: Verlin Lonni BIRCH, MD;  [facility];  Service: Cardiovascular;  Laterality: N/A;   CORONARY/GRAFT ACUTE MI REVASCULARIZATION N/A 02/12/2021   Procedure: Coronary/Graft Acute MI Revascularization;  Surgeon: Verlin Lonni BIRCH, MD;  [facility]: [facility];  Service: Cardiovascular;  Laterality: N/A;   EXTERNAL FIXATION LEG Right 02/27/2016   Procedure: EXTERNAL FIXATION LEG;  Surgeon: Marcey Her, MD;  Location: [facility] OR;  Service: Orthopedics;  Laterality: Right;  reduction and external fixation right tibial plateau fracture   EXTERNAL FIXATION REMOVAL Right 03/04/2016   Procedure: REMOVAL EXTERNAL FIXATION LEG;  Surgeon: Ozell Bruch, MD;  Location: [facility] OR;  Service: Orthopedics;  Laterality: Right;   HARVEST BONE GRAFT Left 03/04/2016   Procedure: Rimmed IM aspirate;  Surgeon: Ozell Bruch, MD;  Location: [facility] OR;  Service: Orthopedics;  Laterality: Left;   LEFT HEART CATH AND CORONARY ANGIOGRAPHY N/A 02/12/2021   Procedure: LEFT HEART CATH AND CORONARY ANGIOGRAPHY;  Surgeon: Verlin Lonni BIRCH, MD;  [facility];  Service: Cardiovascular;  Laterality: N/A;   ORIF TIBIA PLATEAU Right 03/04/2016   Procedure: OPEN REDUCTION INTERNAL FIXATION (ORIF) TIBIAL PLATEAU;  Surgeon: Ozell Bruch, MD;  Location: [facility] OR;  Service: Orthopedics;  Laterality: Right;    SOCIAL HISTORY: Social History   Socioeconomic History   Marital status: Married    Spouse name: Yancy Knoble   Number of children: 3   Years of education: Not on file   Highest education level: High school graduate  Occupational History   Occupation: disability  Tobacco Use   Smoking status: Never   Smokeless tobacco: Never  Vaping Use   Vaping status: Never Used  Substance and Sexual Activity   Alcohol use: Not Currently   Drug use: No   Sexual activity: Yes  Other  Topics Concern   Not on file  Social History Narrative   Not on file   Social Drivers of Health   Financial Resource Strain: High Risk (02/16/2021)   Overall Financial Resource Strain (CARDIA)    Difficulty of Paying Living Expenses: Hard  Food Insecurity: Food Insecurity Present (03/29/2024)   Hunger Vital Sign    Worried About Running Out of Food in the Last Year: Sometimes true    Ran Out of Food in the Last Year: Never true  Transportation Needs: No Transportation Needs (03/29/2024)   PRAPARE - Administrator, Civil Service (Medical): No    Lack of Transportation (Non-Medical): No  Physical Activity: Not on file  Stress: Not on file  Social Connections: Unknown (01/26/2022)   Received from [facility]   Social Network    Social Network: Not on file  Intimate Partner Violence: Not At Risk (03/29/2024)   Humiliation, Afraid, Rape, and Kick questionnaire    Fear of Current or Ex-Partner: No    Emotionally Abused: No    Physically Abused: No    Sexually Abused: No    FAMILY HISTORY: Family History  Problem Relation Age of Onset   Cancer Mother    Cancer Father     ALLERGIES:  is allergic to spironolactone .  MEDICATIONS:  Current Outpatient Medications  Medication Sig Dispense Refill   albuterol (VENTOLIN HFA) 108 (90 Base) MCG/ACT inhaler as needed.     carvedilol  (COREG ) 12.5 MG tablet Patient takes 1 & 1/2 tablet by mouth twice a day. 180 tablet 3   clopidogrel  (PLAVIX ) 75 MG tablet Take 1 tablet by mouth once daily 90 tablet 2   ezetimibe  (ZETIA ) 10 MG tablet Take 1 tablet by mouth once daily 90 tablet 3   furosemide  (LASIX ) 40 MG tablet Take 1 tablet by mouth once daily 90 tablet 0   linaclotide  (LINZESS ) 290 MCG CAPS capsule Take 1 capsule (290 mcg total) by mouth daily before breakfast. Lot: [id number], exp: 10-2024 12 capsule 0   Magnesium  Hydroxide (DULCOLAX PO) Take by mouth as needed. 2 in the morning and 3 in the night     metFORMIN  (GLUCOPHAGE -XR)  500 MG 24 hr tablet Take 500 mg by mouth 2 (two) times daily.     rosuvastatin  (CRESTOR ) 40 MG tablet Take 1 tablet (40 mg total) by mouth daily. 90 tablet 1   sacubitril -valsartan  (ENTRESTO ) 97-103 MG TAKE 1 TABLET BY MOUTH TWICE DAILY . APPOINTMENT REQUIRED FOR FUTURE REFILLS 180 tablet 3   spironolactone  (ALDACTONE ) 25 MG tablet Take 1/2 (one-half) tablet by mouth once daily (Patient taking differently: Take 12.5 mg by mouth once.) 45 tablet 3   Tenapanor HCl (IBSRELA ) 50 MG TABS Take 50 mg by mouth 2 (two) times daily before a meal. 60 tablet 5   blood glucose meter kit and supplies KIT Dispense based on patient and insurance preference. Use up to four times daily as directed. (FOR ICD-9 250.00, 250.01). 1 each 0   FREESTYLE PRECISION NEO TEST test strip USE 1 STRIP TO CHECK GLUCOSE ONCE DAILY     nitroGLYCERIN  (NITROSTAT ) 0.4 MG SL tablet Place 1 tablet (0.4 mg total) under the tongue every 5 (  five) minutes as needed for chest pain. 25 tablet 2   No current facility-administered medications for this visit.    REVIEW OF SYSTEMS:   Constitutional: Denies fevers, chills or abnormal night sweats Eyes: Denies blurriness of vision, double vision or watery eyes Ears, nose, mouth, throat, and face: Denies mucositis or sore throat Respiratory: Denies cough, dyspnea or wheezes Cardiovascular: Denies palpitation, chest discomfort or lower extremity swelling Gastrointestinal:  Denies nausea, heartburn or change in bowel habits Skin: Denies abnormal skin rashes Lymphatics: Denies new lymphadenopathy or easy bruising Neurological:Denies numbness, tingling or new weaknesses Behavioral/Psych: Mood is stable, no new changes  All other systems were reviewed with the patient and are negative.  PHYSICAL EXAMINATION: ECOG PERFORMANCE STATUS: 1 - Symptomatic but completely ambulatory  Vitals:   03/29/24 1135  BP: 119/64  Pulse: (!) 56  Resp: 18  Temp: 98.9 F (37.2 C)  SpO2: 99%   Filed Weights    03/29/24 1135  Weight: 213 lb 4.8 oz (96.8 kg)    GENERAL:alert, no distress and comfortable SKIN: skin color, texture, turgor are normal, no rashes or significant lesions EYES: normal, conjunctiva are pink and non-injected, sclera clear OROPHARYNX:no exudate, no erythema and lips, buccal mucosa, and tongue normal  NECK: supple, thyroid  normal size, non-tender, without nodularity LYMPH:  no palpable lymphadenopathy in the cervical, axillary or inguinal LUNGS: clear to auscultation and percussion with normal breathing effort HEART: regular rate & rhythm and no murmurs and no lower extremity edema ABDOMEN:abdomen soft, non-tender and normal bowel sounds Musculoskeletal:no cyanosis of digits and no clubbing  PSYCH: alert & oriented x 3 with fluent speech NEURO: no focal motor/sensory deficits  RADIOGRAPHIC STUDIES: I have personally reviewed the radiological images as listed and agreed with the findings in the report. No results found.

## 2024-04-02 ENCOUNTER — Encounter: Admitting: Physical Therapy

## 2024-04-02 LAB — MULTIPLE MYELOMA PANEL, SERUM
Albumin SerPl Elph-Mcnc: 3.7 g/dL (ref 2.9–4.4)
Albumin/Glob SerPl: 1.1 (ref 0.7–1.7)
Alpha 1: 0.2 g/dL (ref 0.0–0.4)
Alpha2 Glob SerPl Elph-Mcnc: 1.1 g/dL — ABNORMAL HIGH (ref 0.4–1.0)
B-Globulin SerPl Elph-Mcnc: 1.3 g/dL (ref 0.7–1.3)
Gamma Glob SerPl Elph-Mcnc: 1.1 g/dL (ref 0.4–1.8)
Globulin, Total: 3.7 g/dL (ref 2.2–3.9)
IgA: 336 mg/dL (ref 90–386)
IgG (Immunoglobin G), Serum: 1111 mg/dL (ref 603–1613)
IgM (Immunoglobulin M), Srm: 51 mg/dL (ref 20–172)
Total Protein ELP: 7.4 g/dL (ref 6.0–8.5)

## 2024-04-09 ENCOUNTER — Other Ambulatory Visit (HOSPITAL_COMMUNITY)

## 2024-04-16 ENCOUNTER — Other Ambulatory Visit (HOSPITAL_COMMUNITY): Payer: Self-pay

## 2024-04-18 DIAGNOSIS — E113512 Type 2 diabetes mellitus with proliferative diabetic retinopathy with macular edema, left eye: Secondary | ICD-10-CM | POA: Diagnosis not present

## 2024-04-18 DIAGNOSIS — H401121 Primary open-angle glaucoma, left eye, mild stage: Secondary | ICD-10-CM | POA: Diagnosis not present

## 2024-04-18 DIAGNOSIS — H04129 Dry eye syndrome of unspecified lacrimal gland: Secondary | ICD-10-CM | POA: Diagnosis not present

## 2024-04-24 ENCOUNTER — Other Ambulatory Visit (HOSPITAL_COMMUNITY): Payer: Self-pay | Admitting: Cardiology

## 2024-05-01 ENCOUNTER — Inpatient Hospital Stay

## 2024-05-01 ENCOUNTER — Inpatient Hospital Stay: Attending: Hematology and Oncology | Admitting: Hematology and Oncology

## 2024-05-01 ENCOUNTER — Other Ambulatory Visit: Payer: Self-pay | Admitting: Hematology and Oncology

## 2024-05-01 ENCOUNTER — Other Ambulatory Visit: Payer: Self-pay

## 2024-05-01 VITALS — BP 135/73 | HR 85 | Temp 98.0°F | Resp 14 | Ht 68.0 in | Wt 213.0 lb

## 2024-05-01 DIAGNOSIS — D649 Anemia, unspecified: Secondary | ICD-10-CM | POA: Diagnosis not present

## 2024-05-01 LAB — CBC WITH DIFFERENTIAL (CANCER CENTER ONLY)
Abs Immature Granulocytes: 0.02 K/uL (ref 0.00–0.07)
Basophils Absolute: 0.1 K/uL (ref 0.0–0.1)
Basophils Relative: 1 %
Eosinophils Absolute: 0.3 K/uL (ref 0.0–0.5)
Eosinophils Relative: 4 %
HCT: 35.7 % — ABNORMAL LOW (ref 39.0–52.0)
Hemoglobin: 12.3 g/dL — ABNORMAL LOW (ref 13.0–17.0)
Immature Granulocytes: 0 %
Lymphocytes Relative: 26 %
Lymphs Abs: 2 K/uL (ref 0.7–4.0)
MCH: 30.2 pg (ref 26.0–34.0)
MCHC: 34.5 g/dL (ref 30.0–36.0)
MCV: 87.7 fL (ref 80.0–100.0)
Monocytes Absolute: 0.6 K/uL (ref 0.1–1.0)
Monocytes Relative: 7 %
Neutro Abs: 4.9 K/uL (ref 1.7–7.7)
Neutrophils Relative %: 62 %
Platelet Count: 227 K/uL (ref 150–400)
RBC: 4.07 MIL/uL — ABNORMAL LOW (ref 4.22–5.81)
RDW: 12.3 % (ref 11.5–15.5)
WBC Count: 7.9 K/uL (ref 4.0–10.5)
nRBC: 0 % (ref 0.0–0.2)

## 2024-05-01 NOTE — Progress Notes (Signed)
 [facility] CONSULT NOTE  Patient Care Team: Erick Greig LABOR, NP as PCP - General (Internal Medicine) Verlin Lonni BIRCH, MD as PCP - Cardiology (Cardiology)  ASSESSMENT & PLAN:  Anemia: Recent mild anemia with normal iron studies. He suffers from severe constipation and has been evaluated by GI with negative findings. Today, CBC reveals hemoglobin 12.3. Iron studies as well as SPEP are all normal. We discussed no need for treatment at this time. He will continue to follow a healthy diet and we will repeat evaluation in 6 months.    All questions were answered. The patient knows to call the clinic with any problems, questions or concerns.  The total time spent in the appointment was 20 minutes encounter with patients including review of chart and various tests results, discussions about plan of care and coordination of care plan  Eleanor LABOR Bach, NP 11/25/202510:50 AM   CHIEF COMPLAINTS/PURPOSE OF CONSULTATION:  Anemia  HISTORY OF PRESENTING ILLNESS:  Lawrence [name] 58 y.o. male is here because of anemia  He was found to have abnormal CBC from 10/23 He denies recent chest pain on exertion, shortness of breath on minimal exertion, pre-syncopal episodes, or palpitations. He had not noticed any recent bleeding such as epistaxis, hematuria or hematochezia The patient denies over the counter NSAID ingestion. He is not  on antiplatelets agents. His last colonoscopy was this year He had no prior history or diagnosis of cancer. His age appropriate screening programs are up-to-date. He denies any pica and eats a variety of diet. He never donated blood or received blood transfusion The patient was not prescribed oral iron supplements  MEDICAL HISTORY:  Past Medical History:  Diagnosis Date   Chronic constipation    Chronic systolic heart failure (HCC)    Diabetes mellitus without complication (HCC)    HTN (hypertension)    Hyperlipidemia    STEMI (ST elevation  myocardial infarction) (HCC)     SURGICAL HISTORY: Past Surgical History:  Procedure Laterality Date   CORONARY STENT INTERVENTION N/A 02/12/2021   Procedure: CORONARY STENT INTERVENTION;  Surgeon: Verlin Lonni BIRCH, MD;  Location: [facility] INVASIVE CV LAB;  Service: Cardiovascular;  Laterality: N/A;   CORONARY/GRAFT ACUTE MI REVASCULARIZATION N/A 02/12/2021   Procedure: Coronary/Graft Acute MI Revascularization;  Surgeon: Verlin Lonni BIRCH, MD;  Location: [facility] INVASIVE CV LAB;  Service: Cardiovascular;  Laterality: N/A;   EXTERNAL FIXATION LEG Right 02/27/2016   Procedure: EXTERNAL FIXATION LEG;  Surgeon: Marcey Her, MD;  Location: [facility] OR;  Service: Orthopedics;  Laterality: Right;  reduction and external fixation right tibial plateau fracture   EXTERNAL FIXATION REMOVAL Right 03/04/2016   Procedure: REMOVAL EXTERNAL FIXATION LEG;  Surgeon: Ozell Bruch, MD;  Location: [facility] OR;  Service: Orthopedics;  Laterality: Right;   HARVEST BONE GRAFT Left 03/04/2016   Procedure: Rimmed IM aspirate;  Surgeon: Ozell Bruch, MD;  Location: [facility] OR;  Service: Orthopedics;  Laterality: Left;   LEFT HEART CATH AND CORONARY ANGIOGRAPHY N/A 02/12/2021   Procedure: LEFT HEART CATH AND CORONARY ANGIOGRAPHY;  Surgeon: Verlin Lonni BIRCH, MD;  Location: [facility] INVASIVE CV LAB;  Service: Cardiovascular;  Laterality: N/A;   ORIF TIBIA PLATEAU Right 03/04/2016   Procedure: OPEN REDUCTION INTERNAL FIXATION (ORIF) TIBIAL PLATEAU;  Surgeon: Ozell Bruch, MD;  Location: [facility] OR;  Service: Orthopedics;  Laterality: Right;    SOCIAL HISTORY: Social History   Socioeconomic History   Marital status: Married    Spouse name: Dewan Emond   Number of children: 3  Years of education: Not on file   Highest education level: High school graduate  Occupational History   Occupation: disability  Tobacco Use   Smoking status: Never   Smokeless tobacco: Never  Vaping Use   Vaping status: Never Used  Substance and Sexual Activity    Alcohol use: Not Currently   Drug use: No   Sexual activity: Yes  Other Topics Concern   Not on file  Social History Narrative   Not on file   Social Drivers of Health   Financial Resource Strain: High Risk (02/16/2021)   Overall Financial Resource Strain (CARDIA)    Difficulty of Paying Living Expenses: Hard  Food Insecurity: Food Insecurity Present (03/29/2024)   Hunger Vital Sign    Worried About Running Out of Food in the Last Year: Sometimes true    Ran Out of Food in the Last Year: Never true  Transportation Needs: No Transportation Needs (03/29/2024)   PRAPARE - Administrator, Civil Service (Medical): No    Lack of Transportation (Non-Medical): No  Physical Activity: Not on file  Stress: Not on file  Social Connections: Unknown (01/26/2022)   Received from [facility]   Social Network    Social Network: Not on file  Intimate Partner Violence: Not At Risk (03/29/2024)   Humiliation, Afraid, Rape, and Kick questionnaire    Fear of Current or Ex-Partner: No    Emotionally Abused: No    Physically Abused: No    Sexually Abused: No    FAMILY HISTORY: Family History  Problem Relation Age of Onset   Cancer Mother    Cancer Father     ALLERGIES:  is allergic to spironolactone .  MEDICATIONS:  Current Outpatient Medications  Medication Sig Dispense Refill   albuterol (VENTOLIN HFA) 108 (90 Base) MCG/ACT inhaler as needed.     blood glucose meter kit and supplies KIT Dispense based on patient and insurance preference. Use up to four times daily as directed. (FOR ICD-9 250.00, 250.01). 1 each 0   carvedilol  (COREG ) 12.5 MG tablet Patient takes 1 & 1/2 tablet by mouth twice a day. 180 tablet 3   clopidogrel  (PLAVIX ) 75 MG tablet Take 1 tablet by mouth once daily 90 tablet 3   ezetimibe  (ZETIA ) 10 MG tablet Take 1 tablet by mouth once daily 90 tablet 3   FREESTYLE PRECISION NEO TEST test strip USE 1 STRIP TO CHECK GLUCOSE ONCE DAILY     furosemide  (LASIX )  40 MG tablet Take 1 tablet by mouth once daily 90 tablet 0   linaclotide  (LINZESS ) 290 MCG CAPS capsule Take 1 capsule (290 mcg total) by mouth daily before breakfast. Lot: [id number], exp: 10-2024 12 capsule 0   Magnesium  Hydroxide (DULCOLAX PO) Take by mouth as needed. 2 in the morning and 3 in the night     metFORMIN  (GLUCOPHAGE -XR) 500 MG 24 hr tablet Take 500 mg by mouth 2 (two) times daily.     nitroGLYCERIN  (NITROSTAT ) 0.4 MG SL tablet Place 1 tablet (0.4 mg total) under the tongue every 5 (five) minutes as needed for chest pain. 25 tablet 2   rosuvastatin  (CRESTOR ) 40 MG tablet Take 1 tablet (40 mg total) by mouth daily. 90 tablet 1   sacubitril -valsartan  (ENTRESTO ) 97-103 MG TAKE 1 TABLET BY MOUTH TWICE DAILY . APPOINTMENT REQUIRED FOR FUTURE REFILLS 180 tablet 3   spironolactone  (ALDACTONE ) 25 MG tablet Take 1/2 (one-half) tablet by mouth once daily (Patient taking differently: Take 12.5 mg by mouth once.) 45  tablet 3   Tenapanor HCl (IBSRELA ) 50 MG TABS Take 50 mg by mouth 2 (two) times daily before a meal. 60 tablet 5   No current facility-administered medications for this visit.    REVIEW OF SYSTEMS:   Constitutional: Denies fevers, chills or abnormal night sweats Eyes: Denies blurriness of vision, double vision or watery eyes Ears, nose, mouth, throat, and face: Denies mucositis or sore throat Respiratory: Denies cough, dyspnea or wheezes Cardiovascular: Denies palpitation, chest discomfort or lower extremity swelling Gastrointestinal:  Denies nausea, heartburn or change in bowel habits Skin: Denies abnormal skin rashes Lymphatics: Denies new lymphadenopathy or easy bruising Neurological:Denies numbness, tingling or new weaknesses Behavioral/Psych: Mood is stable, no new changes  All other systems were reviewed with the patient and are negative.  PHYSICAL EXAMINATION: ECOG PERFORMANCE STATUS: 1 - Symptomatic but completely ambulatory  There were no vitals filed for this  visit.  There were no vitals filed for this visit.   GENERAL:alert, no distress and comfortable SKIN: skin color, texture, turgor are normal, no rashes or significant lesions EYES: normal, conjunctiva are pink and non-injected, sclera clear OROPHARYNX:no exudate, no erythema and lips, buccal mucosa, and tongue normal  NECK: supple, thyroid  normal size, non-tender, without nodularity LYMPH:  no palpable lymphadenopathy in the cervical, axillary or inguinal LUNGS: clear to auscultation and percussion with normal breathing effort HEART: regular rate & rhythm and no murmurs and no lower extremity edema ABDOMEN:abdomen soft, non-tender and normal bowel sounds Musculoskeletal:no cyanosis of digits and no clubbing  PSYCH: alert & oriented x 3 with fluent speech NEURO: no focal motor/sensory deficits  RADIOGRAPHIC STUDIES: I have personally reviewed the radiological images as listed and agreed with the findings in the report. No results found.

## 2024-05-02 ENCOUNTER — Other Ambulatory Visit (HOSPITAL_COMMUNITY): Payer: Self-pay | Admitting: Family Medicine

## 2024-05-02 DIAGNOSIS — N481 Balanitis: Secondary | ICD-10-CM

## 2024-05-02 DIAGNOSIS — I5022 Chronic systolic (congestive) heart failure: Secondary | ICD-10-CM

## 2024-05-09 ENCOUNTER — Ambulatory Visit (HOSPITAL_COMMUNITY)
Admission: RE | Admit: 2024-05-09 | Discharge: 2024-05-09 | Disposition: A | Source: Ambulatory Visit | Attending: Cardiology | Admitting: Cardiology

## 2024-05-09 ENCOUNTER — Ambulatory Visit (HOSPITAL_COMMUNITY): Payer: Self-pay | Admitting: Cardiology

## 2024-05-09 ENCOUNTER — Ambulatory Visit (HOSPITAL_COMMUNITY)
Admission: RE | Admit: 2024-05-09 | Discharge: 2024-05-09 | Disposition: A | Source: Ambulatory Visit | Attending: Family Medicine | Admitting: Family Medicine

## 2024-05-09 VITALS — BP 142/86 | HR 85 | Wt 215.4 lb

## 2024-05-09 DIAGNOSIS — I5022 Chronic systolic (congestive) heart failure: Secondary | ICD-10-CM

## 2024-05-09 DIAGNOSIS — G8929 Other chronic pain: Secondary | ICD-10-CM | POA: Diagnosis not present

## 2024-05-09 DIAGNOSIS — Z7984 Long term (current) use of oral hypoglycemic drugs: Secondary | ICD-10-CM | POA: Diagnosis not present

## 2024-05-09 DIAGNOSIS — Z7902 Long term (current) use of antithrombotics/antiplatelets: Secondary | ICD-10-CM | POA: Diagnosis not present

## 2024-05-09 DIAGNOSIS — E785 Hyperlipidemia, unspecified: Secondary | ICD-10-CM | POA: Diagnosis not present

## 2024-05-09 DIAGNOSIS — Z79899 Other long term (current) drug therapy: Secondary | ICD-10-CM | POA: Diagnosis not present

## 2024-05-09 DIAGNOSIS — R9431 Abnormal electrocardiogram [ECG] [EKG]: Secondary | ICD-10-CM | POA: Diagnosis not present

## 2024-05-09 DIAGNOSIS — I2510 Atherosclerotic heart disease of native coronary artery without angina pectoris: Secondary | ICD-10-CM | POA: Diagnosis not present

## 2024-05-09 DIAGNOSIS — I252 Old myocardial infarction: Secondary | ICD-10-CM | POA: Diagnosis not present

## 2024-05-09 LAB — ECHOCARDIOGRAM COMPLETE
AR max vel: 3.61 cm2
AV Area VTI: 3.35 cm2
AV Area mean vel: 3.2 cm2
AV Mean grad: 3 mmHg
AV Peak grad: 4.2 mmHg
Ao pk vel: 1.02 m/s
Area-P 1/2: 5.7 cm2
Calc EF: 36.2 %
MV VTI: 3.55 cm2
S' Lateral: 4.4 cm
Single Plane A2C EF: 33.6 %
Single Plane A4C EF: 39.6 %

## 2024-05-09 LAB — BASIC METABOLIC PANEL WITH GFR
Anion gap: 7 (ref 5–15)
BUN: 18 mg/dL (ref 6–20)
CO2: 27 mmol/L (ref 22–32)
Calcium: 9 mg/dL (ref 8.9–10.3)
Chloride: 99 mmol/L (ref 98–111)
Creatinine, Ser: 0.99 mg/dL (ref 0.61–1.24)
GFR, Estimated: >60 mL/min (ref >60)
Glucose, Bld: 190 mg/dL — ABNORMAL HIGH (ref 70–99)
Potassium: 4.4 mmol/L (ref 3.5–5.1)
Sodium: 133 mmol/L — ABNORMAL LOW (ref 135–145)

## 2024-05-09 LAB — LIPID PANEL
Cholesterol: 206 mg/dL — ABNORMAL HIGH (ref 0–200)
HDL: 30 mg/dL — ABNORMAL LOW (ref >40)
LDL Cholesterol: 124 mg/dL — ABNORMAL HIGH (ref 0–99)
Total CHOL/HDL Ratio: 6.9 ratio
Triglycerides: 260 mg/dL — ABNORMAL HIGH (ref <150)
VLDL: 52 mg/dL — ABNORMAL HIGH (ref 0–40)

## 2024-05-09 LAB — BRAIN NATRIURETIC PEPTIDE: B Natriuretic Peptide: 68.6 pg/mL (ref 0.0–100.0)

## 2024-05-09 MED ORDER — CARVEDILOL 25 MG PO TABS: 25.0000 mg | ORAL_TABLET | Freq: Two times a day (BID) | ORAL | 3 refills | Status: AC

## 2024-05-09 MED ORDER — SPIRONOLACTONE 25 MG PO TABS: 12.5000 mg | ORAL_TABLET | Freq: Every day | ORAL | 3 refills | Status: AC

## 2024-05-09 NOTE — Patient Instructions (Signed)
 CHANGE Carvedilol  to 25 mg Twice daily  CHANGE Spironolactone  to 12.5 mg ( 1/2 Tab) daily.  Labs done today, your results will be available in MyChart, we will contact you for abnormal readings.   Blood work again in 10 days.  Your physician recommends that you schedule a follow-up appointment in: 3 months.  If you have any questions or concerns before your next appointment please send us  a message through [place] or call our office at [phone].    TO LEAVE A MESSAGE FOR THE NURSE SELECT OPTION 2, PLEASE LEAVE A MESSAGE INCLUDING: YOUR NAME DATE OF BIRTH CALL BACK NUMBER REASON FOR CALL**this is important as we prioritize the call backs  YOU WILL RECEIVE A CALL BACK THE SAME DAY AS LONG AS YOU CALL BEFORE 4:00 PM  At the [facility], you and your health needs are our priority. As part of our continuing mission to provide you with exceptional heart care, we have created designated Provider Care Teams. These Care Teams include your primary Cardiologist (physician) and Advanced Practice Providers (APPs- Physician Assistants and Nurse Practitioners) who all work together to provide you with the care you need, when you need it.   You may see any of the following providers on your designated Care Team at your next follow up: Dr Toribio Fuel Dr Ezra Shuck Dr. Morene Brownie Greig Mosses, NP Caffie Shed, [place] [facility] [place], [place] Beckey Coe, NP Jordan Lee, NP Ellouise Class, NP Tinnie Redman, PharmD Jaun Bash, PharmD   Please be sure to bring in all your medications bottles to every appointment.    Thank you for choosing [facility]

## 2024-05-09 NOTE — Progress Notes (Signed)
  Echocardiogram 2D Echocardiogram has been performed.  Norleen ORN Steffanie Mingle 05/09/2024, 9:20 AM

## 2024-05-09 NOTE — Progress Notes (Signed)
 ADVANCED HF CLINIC NOTE  Primary Care: Erick Greig LABOR, NP HF Cardiologist: Dr. Rolan  Chief complaint: CHF   HPI: Lawrence [name] is a 58 y.o. Hispanic male with a history of chronic knee pain from crush injury, DM, HTN, hyperlipidemia, CAD, ischemic cardiomyopathy.    Admitted 02/12/2021 with chest pain in the setting of STEMI. Had LHC and underwent successful PTCA/DES x 1 mid LAD and successful PTCA/DES x 1 obtuse marginal.  Echo in 9/22 showed EF 30-35%. CMRI with EF 31% and unable to assess viability due to acute MI, no thrombus. Discharged with LifeVest. Discharged on losartan , coreg , jardiance , and spironolactone .     Echo in 12/22 showed EF 30-35%, septal/apical severe hypokinesis, normal RV.  Patient wanted to wait a few more months and repeat echo before committing to ICD.  Repeat echo in 3/23 with EF still 350-35%, septal/apical severe HK, RV normal.  Patient decided he did not want ICD and took off Lifevest.   Echo 3/24 showed EF stable 35-40%.   Jardiance  stopped with episode of balanitis.   Echo was done today and I reviewed, EF 35-40% with mild LV dilation, normal RV size and systolic function.   Today he returns for HF follow up. Weight down 2 lbs.  BP mildly elevated.  No dyspnea with usual activities.  No chest pain.  No lightheadedness.  No orthopnea/PND.  He is off spironolactone  because of K, but looking back over labs, the higher K I see was 5.   ECG (personally reviewed): NSR, nonspecific T wave flattening.   Labs (2/24): K 4.7, creatinine 1.11, LDL 48 Labs (9/24): LDL 157 Labs (10/24): K 5.3, creatinine 1.03, LFTs normal Labs (12/24): K 4.2, creatinine 1.11   SH: Lives with his wife and child. Disabled from previous knee injury. He does not drink alcohol. He does not smoke.   PMH: 1. Type 2 diabetes 2. HTN 3. Hyperlipidemia 4. CAD: Anterior MI in 9/22 with DES to mLAD and DES to OM2.  5. Chronic systolic CHF: Ischemic cardiomyopathy.   - Echo (9/22): EF 30-35%,  normal RV.  - Cardiac MRI (9/22): No thrombus. LGE consistent with myocardial infarction in portion of the mid anterior/anteroseptal Unable to assess for viability in setting of acute MI (LGE overestimates final infarct size in setting of acute MI). EF 31%. Akinesis of mid anterior/anteroseptal walls, apical anterior/septal/inferior walls, and apex. RV normal.  - Echo (12/22): EF 30-35%, septal/apical severe hypokinesis, normal RV. - Echo (3/24): EF 35-40%, RV normal - Echo (12/25): EF 35-40% with mild LV dilation, normal RV size and systolic function.  6. Diabetic retinopathy   Review of Systems: All systems reviewed and negative except as per HPI.   Current Outpatient Medications  Medication Sig Dispense Refill   albuterol (VENTOLIN HFA) 108 (90 Base) MCG/ACT inhaler as needed.     blood glucose meter kit and supplies KIT Dispense based on patient and insurance preference. Use up to four times daily as directed. (FOR ICD-9 250.00, 250.01). 1 each 0   clopidogrel  (PLAVIX ) 75 MG tablet Take 1 tablet by mouth once daily 90 tablet 3   ezetimibe  (ZETIA ) 10 MG tablet Take 1 tablet by mouth once daily 90 tablet 3   FREESTYLE PRECISION NEO TEST test strip USE 1 STRIP TO CHECK GLUCOSE ONCE DAILY     furosemide  (LASIX ) 40 MG tablet Take 1 tablet by mouth once daily 90 tablet 0   linaclotide  (LINZESS ) 290 MCG CAPS capsule Take 1 capsule (290 mcg total)  by mouth daily before breakfast. Lot: [id number], exp: 10-2024 12 capsule 0   Magnesium  Hydroxide (DULCOLAX PO) Take by mouth as needed. 2 in the morning and 3 in the night     metFORMIN  (GLUCOPHAGE -XR) 500 MG 24 hr tablet Take 500 mg by mouth 2 (two) times daily.     nitroGLYCERIN  (NITROSTAT ) 0.4 MG SL tablet Place 1 tablet (0.4 mg total) under the tongue every 5 (five) minutes as needed for chest pain. 25 tablet 2   rosuvastatin  (CRESTOR ) 40 MG tablet Take 1 tablet (40 mg total) by mouth daily. 90 tablet 1   sacubitril -valsartan  (ENTRESTO ) 97-103 MG TAKE 1  TABLET BY MOUTH TWICE DAILY . APPOINTMENT REQUIRED FOR FUTURE REFILLS 180 tablet 3   Tenapanor HCl (IBSRELA ) 50 MG TABS Take 50 mg by mouth 2 (two) times daily before a meal. 60 tablet 5   carvedilol  (COREG ) 25 MG tablet Take 1 tablet (25 mg total) by mouth 2 (two) times daily with a meal. 180 tablet 3   spironolactone  (ALDACTONE ) 25 MG tablet Take 0.5 tablets (12.5 mg total) by mouth daily. 45 tablet 3   No current facility-administered medications for this encounter.   Allergies  Allergen Reactions   Spironolactone      Hyperkalemia   Family History  Problem Relation Age of Onset   Cancer Mother    Cancer Father    BP (!) 142/86   Pulse 85   Wt 97.7 kg (215 lb 6.4 oz)   SpO2 99%   BMI 32.75 kg/m   Wt Readings from Last 3 Encounters:  05/09/24 97.7 kg (215 lb 6.4 oz)  05/01/24 96.6 kg (213 lb)  03/29/24 96.8 kg (213 lb 4.8 oz)   PHYSICAL EXAM: General: NAD Neck: No JVD, no thyromegaly or thyroid  nodule.  Lungs: Clear to auscultation bilaterally with normal respiratory effort. CV: Nondisplaced PMI.  Heart regular S1/S2, no S3/S4, no murmur.  No peripheral edema.  No carotid bruit.  Normal pedal pulses.  Abdomen: Soft, nontender, no hepatosplenomegaly, no distention.  Skin: Intact without lesions or rashes.  Neurologic: Alert and oriented x 3.  Psych: Normal affect. Extremities: No clubbing or cyanosis.  HEENT: Normal.   ASSESSMENT & PLAN: 1. Chronic systolic CHF:  Ischemic cardiomyopathy.  Echo in 9/22 with EF 30-35%.  Repeat echo in 12/22 showed EF still in the 30-35% range with normal RV.  Repeat echo in 3/23 with EF still 30-35%.  He decided against ICD placement after extensive discussion and removed LifeVest.  Echo in 3/24 and again today showed EF 35-40%.  Weight down 2 lbs, not volume overloaded on exam.  NYHA class II.  - Off Jardiance  with balanitis.  - Continue Lasix  40 mg daily.  - Continue Entresto  97/103 bid.  - Restart spironolactone  12.5 mg daily and follow  low K diet.  BMET/BNP today, BMET in 10 days.    - Increase Coreg  to 18.75 mg bid.  - EF appears to be just outside ICD range (he refused ICD in the past).  2. CAD: Anterior STEMI in 9/22 with severe mLAD stenosis treated with PCI/DES x1, and severe OM stenosis treated with PCI/DES x1, non dominant RCA with mild non-obstructive disease.  No chest pain.   - Continue Plavix  75 mg daily for long-term.  - Continue Crestor  40 mg daily and Zetia  10 mg daily. Check lipids today.  3. HTN: BP mildly elevated, increasing Coreg  and adding spironolactone  as above.   4. Hyperlipidemia: Continue Crestor  and Zetia . - Check lipids  today.   Follow up in 3 months with APP.   I spent 31 minutes reviewing data, interviewing patient, and organizing the orders/followup.   Lawrence [name]  05/09/2024

## 2024-05-11 ENCOUNTER — Ambulatory Visit (HOSPITAL_COMMUNITY): Payer: Self-pay | Admitting: Family Medicine

## 2024-05-11 NOTE — Telephone Encounter (Signed)
 Patient aware

## 2024-05-21 ENCOUNTER — Ambulatory Visit (HOSPITAL_COMMUNITY)

## 2024-05-30 ENCOUNTER — Ambulatory Visit (HOSPITAL_COMMUNITY)
Admission: RE | Admit: 2024-05-30 | Discharge: 2024-05-30 | Disposition: A | Discharge status: Home / Self Care | Source: Ambulatory Visit | Attending: Internal Medicine | Admitting: Internal Medicine

## 2024-05-30 DIAGNOSIS — I5022 Chronic systolic (congestive) heart failure: Secondary | ICD-10-CM | POA: Diagnosis present

## 2024-05-30 DIAGNOSIS — E782 Mixed hyperlipidemia: Secondary | ICD-10-CM | POA: Insufficient documentation

## 2024-05-30 LAB — LIPID PANEL
Cholesterol: 152 mg/dL (ref 0–200)
HDL: 30 mg/dL — ABNORMAL LOW
LDL Cholesterol: 61 mg/dL (ref 0–99)
Total CHOL/HDL Ratio: 5 ratio
Triglycerides: 306 mg/dL — ABNORMAL HIGH
VLDL: 61 mg/dL — ABNORMAL HIGH (ref 0–40)

## 2024-05-30 LAB — BASIC METABOLIC PANEL WITH GFR
Anion gap: 14 (ref 5–15)
BUN: 29 mg/dL — ABNORMAL HIGH (ref 6–20)
CO2: 24 mmol/L (ref 22–32)
Calcium: 9.7 mg/dL (ref 8.9–10.3)
Chloride: 101 mmol/L (ref 98–111)
Creatinine, Ser: 1.25 mg/dL — ABNORMAL HIGH (ref 0.61–1.24)
GFR, Estimated: >60 mL/min
Glucose, Bld: 225 mg/dL — ABNORMAL HIGH (ref 70–99)
Potassium: 5.1 mmol/L (ref 3.5–5.1)
Sodium: 140 mmol/L (ref 135–145)

## 2024-06-01 ENCOUNTER — Ambulatory Visit (HOSPITAL_COMMUNITY): Payer: Self-pay | Admitting: Cardiology

## 2024-06-04 ENCOUNTER — Other Ambulatory Visit (HOSPITAL_COMMUNITY): Payer: Self-pay

## 2024-06-04 MED ORDER — ICOSAPENT ETHYL 1 G PO CAPS: 2.0000 g | ORAL_CAPSULE | Freq: Two times a day (BID) | ORAL | 5 refills | Status: AC

## 2024-06-05 ENCOUNTER — Telehealth (HOSPITAL_COMMUNITY): Payer: Self-pay

## 2024-06-05 NOTE — Telephone Encounter (Signed)
 [facility] Patient Advocate Encounter  Test billing for this patient's current coverage (Ex Advance) returns a $0 copay for 30 day supply of Icosapent .  This test claim was processed through [facility]- copay amounts may vary at other pharmacies due to pharmacy/plan contracts, or as the patient moves through the different stages of their insurance plan.  Rachel DEL, CPhT Rx Patient Advocate Phone: [phone]

## 2024-06-13 ENCOUNTER — Telehealth: Payer: Self-pay | Admitting: *Deleted

## 2024-06-13 NOTE — Telephone Encounter (Signed)
 Request for surgical clearance:     Endoscopy Procedure  What type of surgery is being performed?     colonoscopy  When is this surgery scheduled?     TBD  What type of clearance is required ?   CARDIAC AND ANTICOAG  Are there any medications that need to be held prior to surgery and how long? Plavix , 5 days  Practice name and name of physician performing surgery?      [facility]  What is your office phone and fax number?      Phone- [phone]  Fax- [phone]  Anesthesia type (None, local, MAC, general) ?       MAC  Please route your response to Naomie Sharps, RN  Thank you!!

## 2024-06-13 NOTE — Telephone Encounter (Signed)
" ° °  Patient Name: Lawrence [name]  DOB: 1965-09-24 MRN: [id number]  Primary Cardiologist: Lonni Cash, MD  Chart reviewed as part of pre-operative protocol coverage. Pre-op clearance already addressed by colleagues in earlier phone notes. To summarize recommendations:  - Per Dr. Rolan, OK for colonoscopy, can hold Plavix  as requested   Will route this bundled recommendation to requesting provider via Epic fax function and remove from pre-op pool. Please call with questions.  Rollo FABIENE Louder, PA-C 06/13/2024, 4:02 PM  "

## 2024-06-13 NOTE — Telephone Encounter (Signed)
 Thanks Dottie.  John - wanted to get your opinion on this patient in regards to where he should have his procedures done, before we schedule. Can you review his file and let me know what you think? Echo updated, EF 35-40%. Thanks

## 2024-06-13 NOTE — Telephone Encounter (Signed)
 Dr Leigh- Please see cardiac and anticoagulation clearance as below. Patient was originally seen in the office 03/06/24 with recommendations for colonoscopy with double prep at the hospital following cardiac and anticoagulation clearance. It appears patient had another ECHO completed 05/09/24 showing EF of 35-40%. Would patient now be eligible for [facility] procedure or should we still schedule him at the hospital?

## 2024-06-14 NOTE — Telephone Encounter (Signed)
 Left message for patient to call back. Will schedule [facility] colonoscopy.

## 2024-06-14 NOTE — Telephone Encounter (Signed)
 Thank you Lawrence [name] for taking a look at this case.   Dottie - can you help coordinate colonoscopy at the [facility]? He will need double prep and hold Plavix  for 5 days. Thanks all!

## 2024-06-15 NOTE — Telephone Encounter (Signed)
 Left message to call back.

## 2024-06-19 NOTE — Telephone Encounter (Signed)
 Left 3rd message for patient to call back. Will also send letter to patient home address via [employer] due to recent inability to speak with patient about his recommended colonoscopy.

## 2024-06-29 ENCOUNTER — Other Ambulatory Visit (HOSPITAL_COMMUNITY): Payer: Self-pay

## 2024-07-13 ENCOUNTER — Ambulatory Visit (HOSPITAL_COMMUNITY): Admission: RE | Admit: 2024-07-13 | Source: Ambulatory Visit

## 2024-07-13 DIAGNOSIS — E785 Hyperlipidemia, unspecified: Secondary | ICD-10-CM

## 2024-07-13 LAB — LIPID PANEL
Cholesterol: 195 mg/dL (ref 0–200)
HDL: 28 mg/dL — ABNORMAL LOW
LDL Cholesterol: UNDETERMINED mg/dL (ref 0–99)
Total CHOL/HDL Ratio: 7.1 ratio
Triglycerides: 627 mg/dL — ABNORMAL HIGH
VLDL: 125 mg/dL — ABNORMAL HIGH (ref 0–40)

## 2024-07-13 LAB — LDL CHOLESTEROL, DIRECT: Direct LDL: 116 mg/dL — ABNORMAL HIGH (ref 0–99)

## 2024-07-13 NOTE — Progress Notes (Signed)
 Lawrence [name]                                          MRN: [id number]   07/13/2024   The [facility]BCI Quality Team Specialist reviewed this patient medical record for the purposes of chart review for care gap closure. The following were reviewed: chart review for care gap closure-kidney health evaluation for diabetes:eGFR  and uACR.    VBCI Quality Team

## 2024-08-08 ENCOUNTER — Ambulatory Visit (HOSPITAL_COMMUNITY)

## 2024-11-12 ENCOUNTER — Inpatient Hospital Stay

## 2024-11-12 ENCOUNTER — Inpatient Hospital Stay: Admitting: Hematology and Oncology
